# Patient Record
Sex: Female | Born: 1985 | Race: White | Hispanic: Yes | Marital: Married | Smoking: Never smoker
Health system: Southern US, Community
[De-identification: ages and names within clinical notes are randomized; demographics above are authoritative.]

## PROBLEM LIST (undated history)

## (undated) DIAGNOSIS — I10 Essential (primary) hypertension: Secondary | ICD-10-CM

## (undated) DIAGNOSIS — O24419 Gestational diabetes mellitus in pregnancy, unspecified control: Secondary | ICD-10-CM

## (undated) DIAGNOSIS — R7303 Prediabetes: Secondary | ICD-10-CM

## (undated) DIAGNOSIS — Z87442 Personal history of urinary calculi: Secondary | ICD-10-CM

## (undated) DIAGNOSIS — E119 Type 2 diabetes mellitus without complications: Secondary | ICD-10-CM

## (undated) HISTORY — DX: Gestational diabetes mellitus in pregnancy, unspecified control: O24.419

## (undated) HISTORY — DX: Type 2 diabetes mellitus without complications: E11.9

---
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
## 2007-12-05 ENCOUNTER — Emergency Department (HOSPITAL_COMMUNITY): Admission: EM | Admit: 2007-12-05 | Discharge: 2007-12-05 | Payer: Self-pay | Admitting: Emergency Medicine

## 2007-12-07 ENCOUNTER — Emergency Department (HOSPITAL_COMMUNITY): Admission: EM | Admit: 2007-12-07 | Discharge: 2007-12-07 | Payer: Self-pay | Admitting: Emergency Medicine

## 2008-09-21 ENCOUNTER — Inpatient Hospital Stay (HOSPITAL_COMMUNITY): Admission: AD | Admit: 2008-09-21 | Discharge: 2008-09-22 | Payer: Self-pay | Admitting: Obstetrics & Gynecology

## 2008-09-21 ENCOUNTER — Ambulatory Visit: Payer: Self-pay | Admitting: Obstetrics & Gynecology

## 2008-09-28 ENCOUNTER — Inpatient Hospital Stay (HOSPITAL_COMMUNITY): Admission: AD | Admit: 2008-09-28 | Discharge: 2008-09-30 | Payer: Self-pay | Admitting: Family Medicine

## 2008-09-28 ENCOUNTER — Ambulatory Visit: Payer: Self-pay | Admitting: Family Medicine

## 2009-08-19 ENCOUNTER — Emergency Department (HOSPITAL_COMMUNITY): Admission: EM | Admit: 2009-08-19 | Discharge: 2009-08-19 | Payer: Self-pay | Admitting: Emergency Medicine

## 2010-09-01 LAB — CBC
HCT: 30.9 % — ABNORMAL LOW (ref 36.0–46.0)
Hemoglobin: 10.2 g/dL — ABNORMAL LOW (ref 12.0–15.0)
MCHC: 33 g/dL (ref 30.0–36.0)
MCV: 68.8 fL — ABNORMAL LOW (ref 78.0–100.0)
Platelets: 255 10*3/uL (ref 150–400)
RBC: 4.49 MIL/uL (ref 3.87–5.11)
RDW: 16.8 % — ABNORMAL HIGH (ref 11.5–15.5)
WBC: 9.2 10*3/uL (ref 4.0–10.5)

## 2010-09-01 LAB — URINALYSIS, ROUTINE W REFLEX MICROSCOPIC
Bilirubin Urine: NEGATIVE
Glucose, UA: NEGATIVE mg/dL
Ketones, ur: NEGATIVE mg/dL
Nitrite: POSITIVE — AB
Protein, ur: 30 mg/dL — AB
Specific Gravity, Urine: 1.025 (ref 1.005–1.030)
Urobilinogen, UA: 0.2 mg/dL (ref 0.0–1.0)
pH: 6 (ref 5.0–8.0)

## 2010-09-01 LAB — DIFFERENTIAL
Basophils Absolute: 0 10*3/uL (ref 0.0–0.1)
Basophils Relative: 0 % (ref 0–1)
Eosinophils Absolute: 0 10*3/uL (ref 0.0–0.7)
Eosinophils Relative: 0 % (ref 0–5)
Lymphocytes Relative: 8 % — ABNORMAL LOW (ref 12–46)
Lymphs Abs: 0.7 10*3/uL (ref 0.7–4.0)
Monocytes Absolute: 0.4 10*3/uL (ref 0.1–1.0)
Monocytes Relative: 4 % (ref 3–12)
Neutro Abs: 8.1 10*3/uL — ABNORMAL HIGH (ref 1.7–7.7)
Neutrophils Relative %: 88 % — ABNORMAL HIGH (ref 43–77)

## 2010-09-01 LAB — URINE MICROSCOPIC-ADD ON

## 2010-09-01 LAB — URINE CULTURE: Colony Count: >=100000

## 2010-09-01 LAB — PREGNANCY, URINE: Preg Test, Ur: NEGATIVE

## 2010-09-18 LAB — CBC
HCT: 32.9 % — ABNORMAL LOW (ref 36.0–46.0)
HCT: 33 % — ABNORMAL LOW (ref 36.0–46.0)
HCT: 34.2 % — ABNORMAL LOW (ref 36.0–46.0)
Hemoglobin: 11.1 g/dL — ABNORMAL LOW (ref 12.0–15.0)
Hemoglobin: 11.2 g/dL — ABNORMAL LOW (ref 12.0–15.0)
Hemoglobin: 11.6 g/dL — ABNORMAL LOW (ref 12.0–15.0)
MCHC: 33.8 g/dL (ref 30.0–36.0)
MCHC: 33.8 g/dL (ref 30.0–36.0)
MCHC: 33.9 g/dL (ref 30.0–36.0)
MCV: 84.6 fL (ref 78.0–100.0)
MCV: 84.8 fL (ref 78.0–100.0)
MCV: 85 fL (ref 78.0–100.0)
Platelets: 189 10*3/uL (ref 150–400)
Platelets: 189 10*3/uL (ref 150–400)
Platelets: 212 10*3/uL (ref 150–400)
RBC: 3.87 MIL/uL (ref 3.87–5.11)
RBC: 3.9 MIL/uL (ref 3.87–5.11)
RBC: 4.04 MIL/uL (ref 3.87–5.11)
RDW: 13.8 % (ref 11.5–15.5)
RDW: 14 % (ref 11.5–15.5)
RDW: 14.4 % (ref 11.5–15.5)
WBC: 7.6 10*3/uL (ref 4.0–10.5)
WBC: 7.8 10*3/uL (ref 4.0–10.5)
WBC: 8 10*3/uL (ref 4.0–10.5)

## 2010-09-18 LAB — DIFFERENTIAL
Basophils Absolute: 0 10*3/uL (ref 0.0–0.1)
Basophils Relative: 0 % (ref 0–1)
Eosinophils Absolute: 0.1 10*3/uL (ref 0.0–0.7)
Eosinophils Relative: 1 % (ref 0–5)
Lymphocytes Relative: 18 % (ref 12–46)
Lymphs Abs: 1.4 10*3/uL (ref 0.7–4.0)
Monocytes Absolute: 0.3 10*3/uL (ref 0.1–1.0)
Monocytes Relative: 4 % (ref 3–12)
Neutro Abs: 5.9 10*3/uL (ref 1.7–7.7)
Neutrophils Relative %: 77 % (ref 43–77)

## 2010-09-18 LAB — RPR
RPR Ser Ql: NONREACTIVE
RPR Ser Ql: NONREACTIVE

## 2010-09-18 LAB — RUBELLA SCREEN: Rubella: 197.1 IU/mL — ABNORMAL HIGH

## 2010-09-18 LAB — HIV ANTIBODY (ROUTINE TESTING W REFLEX): HIV: NONREACTIVE

## 2010-09-18 LAB — HEPATITIS B SURFACE ANTIGEN: Hepatitis B Surface Ag: NEGATIVE

## 2010-09-18 LAB — GC/CHLAMYDIA PROBE AMP, GENITAL
Chlamydia, DNA Probe: NEGATIVE
GC Probe Amp, Genital: NEGATIVE

## 2010-09-18 LAB — TYPE AND SCREEN
ABO/RH(D): O POS
Antibody Screen: NEGATIVE

## 2010-09-18 LAB — STREP B DNA PROBE: Strep Group B Ag: NEGATIVE

## 2010-09-18 LAB — GLUCOSE, RANDOM: Glucose, Bld: 116 mg/dL — ABNORMAL HIGH (ref 70–99)

## 2010-09-18 LAB — ABO/RH: ABO/RH(D): O POS

## 2011-03-06 LAB — WET PREP, GENITAL
Clue Cells Wet Prep HPF POC: NONE SEEN
Trich, Wet Prep: NONE SEEN
Yeast Wet Prep HPF POC: NONE SEEN

## 2011-03-06 LAB — URINE MICROSCOPIC-ADD ON

## 2011-03-06 LAB — URINALYSIS, ROUTINE W REFLEX MICROSCOPIC
Bilirubin Urine: NEGATIVE
Bilirubin Urine: NEGATIVE
Glucose, UA: NEGATIVE
Ketones, ur: NEGATIVE
Ketones, ur: NEGATIVE
Leukocytes, UA: NEGATIVE
Nitrite: POSITIVE — AB
Protein, ur: 100 — AB
Protein, ur: 100 — AB
Specific Gravity, Urine: >1.03 — ABNORMAL HIGH
Urobilinogen, UA: 0.2
Urobilinogen, UA: 1
pH: 5.5

## 2011-03-06 LAB — CBC
HCT: 35.4 — ABNORMAL LOW
Hemoglobin: 11.5 — ABNORMAL LOW
Hemoglobin: 12.3
RBC: 4.2
RDW: 14.3
WBC: 7.8

## 2011-03-06 LAB — BASIC METABOLIC PANEL
CO2: 24
Calcium: 8.7
Calcium: 8.8
GFR calc Af Amer: >60
GFR calc Af Amer: >60
GFR calc non Af Amer: >60
GFR calc non Af Amer: >60
Glucose, Bld: 89
Potassium: 3.5
Sodium: 138
Sodium: 138

## 2011-03-06 LAB — POCT PREGNANCY, URINE
Operator id: 277751
Preg Test, Ur: POSITIVE

## 2011-03-06 LAB — DIFFERENTIAL
Basophils Absolute: 0
Lymphocytes Relative: 27
Monocytes Absolute: 0.5
Neutro Abs: 5

## 2011-03-06 LAB — GC/CHLAMYDIA PROBE AMP, GENITAL
Chlamydia, DNA Probe: NEGATIVE
GC Probe Amp, Genital: NEGATIVE

## 2011-03-06 LAB — PREGNANCY, URINE: Preg Test, Ur: POSITIVE

## 2011-03-06 LAB — HCG, QUANTITATIVE, PREGNANCY: hCG, Beta Chain, Quant, S: 1722 — ABNORMAL HIGH

## 2013-03-26 ENCOUNTER — Emergency Department (HOSPITAL_COMMUNITY)
Admission: EM | Admit: 2013-03-26 | Discharge: 2013-03-27 | Disposition: A | Discharge status: Home / Self Care | Payer: Self-pay | Attending: Emergency Medicine | Admitting: Emergency Medicine

## 2013-03-26 ENCOUNTER — Encounter (HOSPITAL_COMMUNITY): Payer: Self-pay | Admitting: Emergency Medicine

## 2013-03-26 ENCOUNTER — Emergency Department (HOSPITAL_COMMUNITY): Payer: Self-pay

## 2013-03-26 DIAGNOSIS — I10 Essential (primary) hypertension: Secondary | ICD-10-CM | POA: Insufficient documentation

## 2013-03-26 DIAGNOSIS — IMO0001 Reserved for inherently not codable concepts without codable children: Secondary | ICD-10-CM | POA: Insufficient documentation

## 2013-03-26 DIAGNOSIS — J159 Unspecified bacterial pneumonia: Secondary | ICD-10-CM | POA: Insufficient documentation

## 2013-03-26 DIAGNOSIS — J189 Pneumonia, unspecified organism: Secondary | ICD-10-CM

## 2013-03-26 DIAGNOSIS — R509 Fever, unspecified: Secondary | ICD-10-CM | POA: Insufficient documentation

## 2013-03-26 HISTORY — DX: Essential (primary) hypertension: I10

## 2013-03-26 MED ORDER — ALBUTEROL SULFATE HFA 108 (90 BASE) MCG/ACT IN AERS
2.0000 | INHALATION_SPRAY | Freq: Four times a day (QID) | RESPIRATORY_TRACT | Status: DC
  Administered 2013-03-26: 2 via RESPIRATORY_TRACT
  Filled 2013-03-26: qty 6.7

## 2013-03-26 MED ORDER — LEVOFLOXACIN 500 MG PO TABS: 500.0000 mg | ORAL_TABLET | Freq: Every day | ORAL | Status: AC

## 2013-03-26 MED ORDER — ALBUTEROL SULFATE (5 MG/ML) 0.5% IN NEBU
5.0000 mg | INHALATION_SOLUTION | Freq: Once | RESPIRATORY_TRACT | Status: AC
  Administered 2013-03-26: 5 mg via RESPIRATORY_TRACT
  Filled 2013-03-26: qty 1

## 2013-03-26 MED ORDER — LEVOFLOXACIN 500 MG PO TABS
500.0000 mg | ORAL_TABLET | Freq: Once | ORAL | Status: AC
  Administered 2013-03-26: 500 mg via ORAL
  Filled 2013-03-26: qty 1

## 2013-03-26 NOTE — ED Notes (Addendum)
Pt states cough, fever, chills sweats and generalized body aches for the past 3 days. Pt states that she has tried OTC medications including tylenol, nyquil and ibuprofen with no relief from symptoms. Pt states cough is dry, non-productive.

## 2013-03-26 NOTE — ED Notes (Signed)
Pt taken to xray 

## 2013-03-26 NOTE — ED Provider Notes (Signed)
CSN: [id number]     Arrival date & time 03/26/13  2035 History   First MD Initiated Contact with Patient 03/26/13 2113     Chief Complaint  Patient presents with  . URI   (Consider location/radiation/quality/duration/timing/severity/associated sxs/prior Treatment) HPI Patient presents with concern of ongoing cough, myalgia. Cough began approximately 2 weeks ago, but has become worse over the past 3 days, as her body aches have become more pronounced. The aches are most prominent in the chest and back. There are subjective fever and chills, but no objective change in temperature. There is no concurrent nausea, vomiting, diarrhea, rash, edema. Patient does not smoke, does not drink. No relief with OTC medication.  Past Medical History  Diagnosis Date  . Hypertension    History reviewed. No pertinent past surgical history. History reviewed. No pertinent family history. History  Substance Use Topics  . Smoking status: Never Smoker   . Smokeless tobacco: Not on file  . Alcohol Use: No   OB History   Grav Para Term Preterm Abortions TAB SAB Ect Mult Living                 Review of Systems  Constitutional:       Per HPI, otherwise negative  HENT:       Per HPI, otherwise negative  Respiratory:       Per HPI, otherwise negative  Cardiovascular:       Per HPI, otherwise negative  Gastrointestinal: Negative for vomiting.  Endocrine:       Negative aside from HPI  Genitourinary:       Neg aside from HPI   Musculoskeletal:       Per HPI, otherwise negative  Skin: Negative.   Neurological: Negative for syncope.    Allergies  Review of patient's allergies indicates no known allergies.  Home Medications   Current Outpatient Rx  Name  Route  Sig  Dispense  Refill  . Pseudoeph-Doxylamine-DM-APAP (NYQUIL PO)   Oral   Take 10 mLs by mouth once as needed (for feeling sick).          BP 154/92  Pulse 103  Temp(Src) 99.7 F (37.6 C) (Oral)  Resp 19  SpO2  98% Physical Exam  Nursing note and vitals reviewed. Constitutional: She is oriented to person, place, and time. She appears well-developed and well-nourished. No distress.  HENT:  Head: Normocephalic and atraumatic.  Eyes: Conjunctivae and EOM are normal.  Cardiovascular: Normal rate and regular rhythm.   Pulmonary/Chest: Effort normal. No stridor. No respiratory distress. She has decreased breath sounds.  Abdominal: She exhibits no distension.  Musculoskeletal: She exhibits no edema.  Neurological: She is alert and oriented to person, place, and time. No cranial nerve deficit.  Skin: Skin is warm and dry.  Psychiatric: She has a normal mood and affect.    ED Course  Procedures (including critical care time) Labs Review Labs Reviewed - No data to display Imaging Review No results found.  EKG Interpretation   None      pulse oximetry 100% room air normal X-ray demonstrate pneumonia. MDM  No diagnosis found. This previously well young female presents with no chills, dyspnea, cough.  On exam she is afebrile.  However, given prescription of ongoing symptoms, there was initial suspicion for pneumonia.  This is demonstrated on x-ray.  Absent distress, significant comorbidities, she is appropriate for outpatient management.  She was started on antibiotics here, discharged in stable condition    Gerhard Munch, MD  03/26/13 2306 

## 2013-03-26 NOTE — ED Notes (Signed)
Pt back from from xray

## 2013-09-12 ENCOUNTER — Emergency Department (HOSPITAL_COMMUNITY)
Admission: EM | Admit: 2013-09-12 | Discharge: 2013-09-13 | Disposition: A | Discharge status: Home / Self Care | Payer: Self-pay | Attending: Emergency Medicine | Admitting: Emergency Medicine

## 2013-09-12 ENCOUNTER — Encounter (HOSPITAL_COMMUNITY): Payer: Self-pay | Admitting: Emergency Medicine

## 2013-09-12 DIAGNOSIS — N200 Calculus of kidney: Secondary | ICD-10-CM

## 2013-09-12 DIAGNOSIS — M549 Dorsalgia, unspecified: Secondary | ICD-10-CM

## 2013-09-12 DIAGNOSIS — N390 Urinary tract infection, site not specified: Secondary | ICD-10-CM

## 2013-09-12 DIAGNOSIS — Z3202 Encounter for pregnancy test, result negative: Secondary | ICD-10-CM | POA: Insufficient documentation

## 2013-09-12 LAB — URINALYSIS, ROUTINE W REFLEX MICROSCOPIC
BILIRUBIN URINE: NEGATIVE
Glucose, UA: NEGATIVE mg/dL
KETONES UR: NEGATIVE mg/dL
NITRITE: NEGATIVE
PH: 6.5 (ref 5.0–8.0)
PROTEIN: 100 mg/dL — AB
Specific Gravity, Urine: 1.026 (ref 1.005–1.030)
UROBILINOGEN UA: 1 mg/dL (ref 0.0–1.0)

## 2013-09-12 LAB — URINE MICROSCOPIC-ADD ON

## 2013-09-12 LAB — CBC
HEMATOCRIT: 39.6 % (ref 36.0–46.0)
Hemoglobin: 13.7 g/dL (ref 12.0–15.0)
MCH: 31.3 pg (ref 26.0–34.0)
MCHC: 34.6 g/dL (ref 30.0–36.0)
MCV: 90.4 fL (ref 78.0–100.0)
PLATELETS: 240 10*3/uL (ref 150–400)
RBC: 4.38 MIL/uL (ref 3.87–5.11)
RDW: 12.4 % (ref 11.5–15.5)
WBC: 7.9 10*3/uL (ref 4.0–10.5)

## 2013-09-12 LAB — BASIC METABOLIC PANEL
BUN: 12 mg/dL (ref 6–23)
CALCIUM: 8.9 mg/dL (ref 8.4–10.5)
CHLORIDE: 104 meq/L (ref 96–112)
CO2: 24 mEq/L (ref 19–32)
CREATININE: 0.63 mg/dL (ref 0.50–1.10)
Glucose, Bld: 87 mg/dL (ref 70–99)
Potassium: 4.2 mEq/L (ref 3.7–5.3)
Sodium: 142 mEq/L (ref 137–147)

## 2013-09-12 LAB — POC URINE PREG, ED: Preg Test, Ur: NEGATIVE

## 2013-09-12 NOTE — ED Notes (Addendum)
Pt reports ongoing L flank pain. Pt reports being seen by PCP 1 week ago and dx with kidney infx. Pt has completed antibiotic regimen but is still experiencing discomfort in back and with urination on and off. Pt sts a doctor in the past told her that she may have kidney stones but have never done anything about it.

## 2013-09-13 ENCOUNTER — Emergency Department (HOSPITAL_COMMUNITY): Payer: Self-pay

## 2013-09-13 MED ORDER — CEPHALEXIN 250 MG PO CAPS
500.0000 mg | ORAL_CAPSULE | Freq: Once | ORAL | Status: AC
  Administered 2013-09-13: 500 mg via ORAL
  Filled 2013-09-13: qty 2

## 2013-09-13 MED ORDER — ONDANSETRON HCL 4 MG PO TABS
4.0000 mg | ORAL_TABLET | Freq: Once | ORAL | Status: AC
  Administered 2013-09-13: 4 mg via ORAL
  Filled 2013-09-13: qty 1

## 2013-09-13 MED ORDER — CEPHALEXIN 500 MG PO CAPS: 500.0000 mg | ORAL_CAPSULE | Freq: Three times a day (TID) | ORAL | Status: AC

## 2013-09-13 MED ORDER — OXYCODONE-ACETAMINOPHEN 5-325 MG PO TABS
2.0000 | ORAL_TABLET | Freq: Once | ORAL | Status: AC
  Administered 2013-09-13: 2 via ORAL
  Filled 2013-09-13: qty 2

## 2013-09-13 MED ORDER — TRAMADOL HCL 50 MG PO TABS: 50.0000 mg | ORAL_TABLET | Freq: Four times a day (QID) | ORAL | Status: DC | PRN

## 2013-09-13 NOTE — Discharge Instructions (Signed)
PLEASE RETURN TO THE EMERGENCY DEPARTMENT IF YOU HAVE PERSISTENT FEVER OR WORSENING SYMPTOMS.

## 2013-09-13 NOTE — ED Provider Notes (Signed)
CSN: [id number]     Arrival date & time 09/12/13  1654 History   First MD Initiated Contact with Patient 09/13/13 0017     Chief Complaint  Patient presents with  . Back Pain     (Consider location/radiation/quality/duration/timing/severity/associated sxs/prior Treatment) HPI This patient is a 28 year old woman who notes a history of recent urinary tract infection which is treated with an antibiotic, the name of she cannot recall. Finished course of abx approx 1-2 weeks ago.  She is here today with complaints of lingering left flank pain. She is concerned that she may have a kidney stone. She said her doctor told her once that she might have a kidney stone. This was in 2012. She has never had a diagnostic study.  The patient says her pain is 9/10. She has been nauseated but has not had vomiting. The pain is throbbing and aching and nonradiating. No fever. Urinary frequency and dysuria are endorsed. Nothing improves or worsens her pain.     History reviewed. No pertinent past medical history. History reviewed. No pertinent past surgical history. No family history on file. History  Substance Use Topics  . Smoking status: Never Smoker   . Smokeless tobacco: Not on file  . Alcohol Use: No   OB History   Grav Para Term Preterm Abortions TAB SAB Ect Mult Living                 Review of Systems  Ten point review of symptoms performed and is negative with the exception of symptoms noted above.   Allergies  Review of patient's allergies indicates no known allergies.  Home Medications  No current outpatient prescriptions on file. BP 149/91  Pulse 81  Temp(Src) 98.3 F (36.8 C) (Oral)  Resp 16  Wt 207 lb 11.2 oz (94.212 kg)  SpO2 100%  LMP 08/21/2013 Physical Exam Gen: well developed and well nourished appearing Head: NCAT Eyes: PERL, EOMI Nose: no epistaixis or rhinorrhea Mouth/throat: mucosa is moist and pink Neck: supple, no stridor Lungs: CTA B, no wheezing, rhonchi  or rales CV: regular rate and rythm, good distal pulses.  Abd: soft,  Obese, notender, nondistended Back: no ttp, left cva ttp Skin: warm and dry Ext: no edema, normal to inspection Neuro: CN ii-xii grossly intact, no focal deficits Psyche; normal affect,  calm and cooperative.  ED Course  Procedures (including critical care time) Labs Review  Results for orders placed during the hospital encounter of 09/12/13 (from the past 24 hour(s))  URINALYSIS, ROUTINE W REFLEX MICROSCOPIC     Status: Abnormal   Collection Time    09/12/13  5:16 PM      Result Value Ref Range   Color, Urine YELLOW  YELLOW   APPearance CLOUDY (*) CLEAR   Specific Gravity, Urine 1.026  1.005 - 1.030   pH 6.5  5.0 - 8.0   Glucose, UA NEGATIVE  NEGATIVE mg/dL   Hgb urine dipstick SMALL (*) NEGATIVE   Bilirubin Urine NEGATIVE  NEGATIVE   Ketones, ur NEGATIVE  NEGATIVE mg/dL   Protein, ur 409100 (*) NEGATIVE mg/dL   Urobilinogen, UA 1.0  0.0 - 1.0 mg/dL   Nitrite NEGATIVE  NEGATIVE   Leukocytes, UA MODERATE (*) NEGATIVE  URINE MICROSCOPIC-ADD ON     Status: Abnormal   Collection Time    09/12/13  5:16 PM      Result Value Ref Range   Squamous Epithelial / LPF FEW (*) RARE   WBC, UA 7-10  <3  WBC/hpf   RBC / HPF 3-6  <3 RBC/hpf   Bacteria, UA FEW (*) RARE   Urine-Other MUCOUS PRESENT    CBC     Status: None   Collection Time    09/12/13  5:23 PM      Result Value Ref Range   WBC 7.9  4.0 - 10.5 K/uL   RBC 4.38  3.87 - 5.11 MIL/uL   Hemoglobin 13.7  12.0 - 15.0 g/dL   HCT 16.1  09.6 - 04.5 %   MCV 90.4  78.0 - 100.0 fL   MCH 31.3  26.0 - 34.0 pg   MCHC 34.6  30.0 - 36.0 g/dL   RDW 40.9  81.1 - 91.4 %   Platelets 240  150 - 400 K/uL  BASIC METABOLIC PANEL     Status: None   Collection Time    09/12/13  5:23 PM      Result Value Ref Range   Sodium 142  137 - 147 mEq/L   Potassium 4.2  3.7 - 5.3 mEq/L   Chloride 104  96 - 112 mEq/L   CO2 24  19 - 32 mEq/L   Glucose, Bld 87  70 - 99 mg/dL   BUN 12  6  - 23 mg/dL   Creatinine, Ser 7.82  0.50 - 1.10 mg/dL   Calcium 8.9  8.4 - 95.6 mg/dL   GFR calc non Af Amer >90  >90 mL/min   GFR calc Af Amer >90  >90 mL/min  POC URINE PREG, ED     Status: None   Collection Time    09/12/13  5:25 PM      Result Value Ref Range   Preg Test, Ur NEGATIVE  NEGATIVE   US Renal (Final result)  Result time: 09/13/13 02:58:15    Final result by Rad Results In Interface (09/13/13 02:58:15)    Narrative:   CLINICAL DATA: Back pain.  EXAM: RENAL/URINARY TRACT ULTRASOUND COMPLETE  COMPARISON: None.  FINDINGS: Right Kidney:  Length: 12.5 cm. Echogenicity within normal limits. No mass or hydronephrosis visualized.  Left Kidney:  Length: 14.1 cm. Echogenicity within normal limits. No evidence of hydronephrosis. Multiple left renal stones are seen, measuring up to 1.5 cm in size. No masses identified.  Bladder:  Appears normal for degree of bladder distention. Bilateral ureteral jets are visualized.  IMPRESSION: 1. No evidence of hydronephrosis. Bilateral ureteral jets seen. 2. Multiple nonobstructing left renal stones seen, measuring up to 1.5 cm in size.   Electronically Signed By: Roanna Raider M.D. On: 09/13/2013 02:58       MDM   Patient with UTI confirmed by urinalysis. We will obtain US of the left kidney to evaluate for signs of ureterolithiasis or pyelonephritis. ED tx to include analgesia, antiemetic and first dose of anitbiotic. Anticipate discharge home.   2130: renal u/s shows that the patient has multiple non obstructing left sided renal stones but no signs of hydronephrosis or obstruction.  I have informed the patient of the U/S findings and need for Urology follow up. Patient referred to Dr. Margarita Grizzle for outpatient follow up re: 1.5cm stone in left kidney. She is counseled re: return precautions.     Brandt Loosen, MD 09/13/13 [phone]

## 2013-10-12 DIAGNOSIS — N390 Urinary tract infection, site not specified: Secondary | ICD-10-CM

## 2013-10-12 DIAGNOSIS — N200 Calculus of kidney: Secondary | ICD-10-CM | POA: Insufficient documentation

## 2013-10-12 HISTORY — DX: Calculus of kidney: N20.0

## 2013-10-12 HISTORY — DX: Urinary tract infection, site not specified: N39.0

## 2013-10-17 HISTORY — PX: LITHOTRIPSY: SUR834

## 2013-10-22 ENCOUNTER — Emergency Department (HOSPITAL_COMMUNITY)
Admission: EM | Admit: 2013-10-22 | Discharge: 2013-10-23 | Disposition: A | Discharge status: Home / Self Care | Payer: No Typology Code available for payment source | Attending: Emergency Medicine | Admitting: Emergency Medicine

## 2013-10-22 ENCOUNTER — Emergency Department (HOSPITAL_COMMUNITY): Payer: No Typology Code available for payment source

## 2013-10-22 ENCOUNTER — Encounter (HOSPITAL_COMMUNITY): Payer: Self-pay | Admitting: Emergency Medicine

## 2013-10-22 DIAGNOSIS — R110 Nausea: Secondary | ICD-10-CM | POA: Insufficient documentation

## 2013-10-22 DIAGNOSIS — Z9889 Other specified postprocedural states: Secondary | ICD-10-CM | POA: Insufficient documentation

## 2013-10-22 DIAGNOSIS — E669 Obesity, unspecified: Secondary | ICD-10-CM | POA: Insufficient documentation

## 2013-10-22 DIAGNOSIS — R109 Unspecified abdominal pain: Secondary | ICD-10-CM | POA: Insufficient documentation

## 2013-10-22 DIAGNOSIS — Z792 Long term (current) use of antibiotics: Secondary | ICD-10-CM | POA: Insufficient documentation

## 2013-10-22 DIAGNOSIS — Z3202 Encounter for pregnancy test, result negative: Secondary | ICD-10-CM | POA: Insufficient documentation

## 2013-10-22 LAB — CBC WITH DIFFERENTIAL/PLATELET
BASOS PCT: 0 % (ref 0–1)
Basophils Absolute: 0 10*3/uL (ref 0.0–0.1)
EOS ABS: 0.2 10*3/uL (ref 0.0–0.7)
EOS PCT: 2 % (ref 0–5)
HEMATOCRIT: 34 % — AB (ref 36.0–46.0)
Hemoglobin: 11.8 g/dL — ABNORMAL LOW (ref 12.0–15.0)
Lymphocytes Relative: 26 % (ref 12–46)
Lymphs Abs: 2.4 10*3/uL (ref 0.7–4.0)
MCH: 31 pg (ref 26.0–34.0)
MCHC: 34.7 g/dL (ref 30.0–36.0)
MCV: 89.2 fL (ref 78.0–100.0)
MONO ABS: 0.7 10*3/uL (ref 0.1–1.0)
Monocytes Relative: 8 % (ref 3–12)
Neutro Abs: 6.1 10*3/uL (ref 1.7–7.7)
Neutrophils Relative %: 64 % (ref 43–77)
Platelets: 305 10*3/uL (ref 150–400)
RBC: 3.81 MIL/uL — ABNORMAL LOW (ref 3.87–5.11)
RDW: 12.4 % (ref 11.5–15.5)
WBC: 9.4 10*3/uL (ref 4.0–10.5)

## 2013-10-22 LAB — COMPREHENSIVE METABOLIC PANEL
ALBUMIN: 3.3 g/dL — AB (ref 3.5–5.2)
ALT: 14 U/L (ref 0–35)
AST: 14 U/L (ref 0–37)
Alkaline Phosphatase: 62 U/L (ref 39–117)
BUN: 11 mg/dL (ref 6–23)
CALCIUM: 9.4 mg/dL (ref 8.4–10.5)
CO2: 23 mEq/L (ref 19–32)
Chloride: 99 mEq/L (ref 96–112)
Creatinine, Ser: 0.5 mg/dL (ref 0.50–1.10)
GFR calc Af Amer: >90 mL/min (ref >90)
GFR calc non Af Amer: >90 mL/min (ref >90)
Glucose, Bld: 100 mg/dL — ABNORMAL HIGH (ref 70–99)
Potassium: 3.6 mEq/L — ABNORMAL LOW (ref 3.7–5.3)
Sodium: 138 mEq/L (ref 137–147)
Total Bilirubin: 0.3 mg/dL (ref 0.3–1.2)
Total Protein: 7.8 g/dL (ref 6.0–8.3)

## 2013-10-22 LAB — LIPASE, BLOOD: LIPASE: 19 U/L (ref 11–59)

## 2013-10-22 LAB — PREGNANCY, URINE: PREG TEST UR: NEGATIVE

## 2013-10-22 MED ORDER — SODIUM CHLORIDE 0.9 % IV SOLN
1000.0000 mL | Freq: Once | INTRAVENOUS | Status: AC
  Administered 2013-10-22: 1000 mL via INTRAVENOUS

## 2013-10-22 MED ORDER — HYDROMORPHONE HCL PF 1 MG/ML IJ SOLN
1.0000 mg | Freq: Once | INTRAMUSCULAR | Status: AC
  Administered 2013-10-22: 1 mg via INTRAVENOUS
  Filled 2013-10-22: qty 1

## 2013-10-22 MED ORDER — KETOROLAC TROMETHAMINE 30 MG/ML IJ SOLN
30.0000 mg | Freq: Once | INTRAMUSCULAR | Status: AC
  Administered 2013-10-23: 30 mg via INTRAVENOUS
  Filled 2013-10-22: qty 1

## 2013-10-22 MED ORDER — ONDANSETRON HCL 4 MG/2ML IJ SOLN
4.0000 mg | Freq: Once | INTRAMUSCULAR | Status: AC
  Administered 2013-10-22: 4 mg via INTRAVENOUS
  Filled 2013-10-22: qty 2

## 2013-10-22 NOTE — ED Provider Notes (Signed)
CSN: [id number]     Arrival date & time 10/22/13  2131 History   First MD Initiated Contact with Patient 10/22/13 2158     Chief Complaint  Patient presents with  . Flank Pain     (Consider location/radiation/quality/duration/timing/severity/associated sxs/prior Treatment) HPI Patient presents with left flank pain.  Pain began earlier today, radiates towards the left inguinal crease.  Pain is severe, sharp, unrelenting. No relief with Percocet. No concurrent fever, chills, vomiting, though the patient has nausea. Patient had a lithotripsy 5 days ago at another facility. She states that she generally recovering well until today.  History reviewed. No pertinent past medical history. Past Surgical History  Procedure Laterality Date  . Lithotripsy  10/17/2013   No family history on file. History  Substance Use Topics  . Smoking status: Never Smoker   . Smokeless tobacco: Not on file  . Alcohol Use: No   OB History   Grav Para Term Preterm Abortions TAB SAB Ect Mult Living                 Review of Systems  Constitutional:       Per HPI, otherwise negative  HENT:       Per HPI, otherwise negative  Respiratory:       Per HPI, otherwise negative  Cardiovascular:       Per HPI, otherwise negative  Gastrointestinal: Positive for nausea. Negative for vomiting and diarrhea.  Endocrine:       Negative aside from HPI  Genitourinary:       Neg aside from HPI   Musculoskeletal:       Per HPI, otherwise negative  Skin: Negative.   Neurological: Negative for syncope.      Allergies  Review of patient's allergies indicates no known allergies.  Home Medications   Prior to Admission medications   Medication Sig Start Date End Date Taking? Authorizing Provider  ciprofloxacin (CIPRO) 500 MG tablet Take 500 mg by mouth 2 (two) times daily. 5 day therapy course patient began on 10/18/2013 10/18/13 10/23/13 Yes Historical Provider, MD  oxyCODONE-acetaminophen (PERCOCET/ROXICET)  5-325 MG per tablet Take 1-2 tablets by mouth every 4 (four) hours as needed for severe pain (for pain).   Yes Historical Provider, MD   BP 135/98  Pulse 81  Temp(Src) 98.7 F (37.1 C) (Oral)  Resp 19  Ht 5\' 5"  (1.651 m)  Wt 207 lb (93.895 kg)  BMI 34.45 kg/m2  SpO2 100%  LMP 09/24/2013 Physical Exam  Nursing note and vitals reviewed. Constitutional: She is oriented to person, place, and time. She appears well-developed and well-nourished. No distress.  Very uncomfortable appearing obese young female.   HENT:  Head: Normocephalic and atraumatic.  Eyes: Conjunctivae and EOM are normal.  Cardiovascular: Normal rate and regular rhythm.   Pulmonary/Chest: Effort normal and breath sounds normal. No stridor. No respiratory distress.  Abdominal: Soft. Normal appearance. She exhibits no distension. There is no tenderness.  Musculoskeletal: She exhibits no edema.       Arms: Neurological: She is alert and oriented to person, place, and time. No cranial nerve deficit.  Skin: Skin is warm and dry.  Psychiatric: She has a normal mood and affect.    ED Course  Procedures (including critical care time) Labs Review Labs Reviewed  CBC WITH DIFFERENTIAL - Abnormal; Notable for the following:    RBC 3.81 (*)    Hemoglobin 11.8 (*)    HCT 34.0 (*)    All other components within  normal limits  COMPREHENSIVE METABOLIC PANEL - Abnormal; Notable for the following:    Potassium 3.6 (*)    Glucose, Bld 100 (*)    Albumin 3.3 (*)    All other components within normal limits  LIPASE, BLOOD  URINALYSIS, ROUTINE W REFLEX MICROSCOPIC  PREGNANCY, URINE    Imaging Review [place] Renal  10/22/2013   CLINICAL DATA:  Left flank pain 5 days post left the lithotripsy  EXAM: RENAL/URINARY TRACT ULTRASOUND COMPLETE  COMPARISON:  CT UROGRAM dated 09/14/2013  FINDINGS: Right Kidney:  Length: 12.6 cm. The echotexture of the right kidney is normal. There is no hydronephrosis.  Left Kidney:  Length: 13.1 cm.  There  is mild hydronephrosis.  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  Bladder:  The urinary bladder is partially distended and grossly normal.  IMPRESSION: 1. There is mild left-sided hydronephrosis. Stone fragments are suspected within the renal collecting system. If further imaging is desired, noncontrast CT scanning would likely be the most useful. 2. The right kidney is normal in appearance.   Electronically Signed   By: David  Swazil[place]   On: 10/22/2013 23:42    I attempted to obtain records from the other facility.  11:50 PM Patient resting comfortably following dilaudid, zofran, NS  12:26 AM Patient nearly asleep. MDM  Patient presents several days after lithotripsy with pain.  Patient is afebrile, awake, alert.  Patient's labs are reassuring, and ultrasound does not demonstrate acute obstruction. With substantial improvement in pain control, patient was discharged in stable condition to follow up with her urology team.   Gerhard Munchobert Pavle Wiler, MD 10/23/13 [phone]

## 2013-10-22 NOTE — ED Notes (Signed)
Bed: WA06 Expected date:  Expected time:  Means of arrival:  Comments: EMS 44F flank pain s/p lithotripsy

## 2013-10-22 NOTE — ED Notes (Signed)
Per EMS- [facility]. Pt thinks they got the bigger ones but not the smaller ones. Takes cipro and roxicet prescriptions from recent evaluation. No relief from Roxicet. C/o 10/10 lower back pain radiating to the groin area on left side. Feels like "pressure." Bandaging from lithotripsy still intact. 20 PIV in left FA. Nausea started upon arrival to hospital.

## 2013-10-23 LAB — URINALYSIS, ROUTINE W REFLEX MICROSCOPIC
Bilirubin Urine: NEGATIVE
GLUCOSE, UA: NEGATIVE mg/dL
Ketones, ur: NEGATIVE mg/dL
Nitrite: NEGATIVE
Protein, ur: 100 mg/dL — AB
SPECIFIC GRAVITY, URINE: 1.016 (ref 1.005–1.030)
UROBILINOGEN UA: 1 mg/dL (ref 0.0–1.0)
pH: 7.5 (ref 5.0–8.0)

## 2013-10-23 LAB — URINE MICROSCOPIC-ADD ON

## 2013-10-23 MED ORDER — HYDROCODONE-ACETAMINOPHEN 10-325 MG PO TABS: 1.0000 | ORAL_TABLET | Freq: Four times a day (QID) | ORAL | Status: DC | PRN

## 2013-10-23 MED ORDER — CIPROFLOXACIN HCL 500 MG PO TABS: 500.0000 mg | ORAL_TABLET | Freq: Two times a day (BID) | ORAL | Status: AC

## 2013-10-23 NOTE — Discharge Instructions (Signed)
As discussed, it is important that you follow up as soon as possible with your physician for continued management of your condition. ° °If you develop any new, or concerning changes in your condition, please return to the emergency department immediately. ° °

## 2014-04-25 ENCOUNTER — Other Ambulatory Visit (HOSPITAL_COMMUNITY): Payer: Self-pay | Admitting: *Deleted

## 2014-04-25 DIAGNOSIS — N6320 Unspecified lump in the left breast, unspecified quadrant: Secondary | ICD-10-CM

## 2014-04-25 DIAGNOSIS — N6310 Unspecified lump in the right breast, unspecified quadrant: Secondary | ICD-10-CM

## 2014-05-10 ENCOUNTER — Encounter (HOSPITAL_COMMUNITY): Payer: Self-pay | Admitting: *Deleted

## 2014-05-11 ENCOUNTER — Ambulatory Visit: Payer: No Typology Code available for payment source

## 2014-05-11 ENCOUNTER — Ambulatory Visit (HOSPITAL_COMMUNITY)
Admission: RE | Admit: 2014-05-11 | Discharge: 2014-05-11 | Disposition: A | Discharge status: Home / Self Care | Payer: No Typology Code available for payment source | Source: Ambulatory Visit | Attending: Obstetrics and Gynecology | Admitting: Obstetrics and Gynecology

## 2014-09-20 ENCOUNTER — Emergency Department (HOSPITAL_COMMUNITY)
Admission: EM | Admit: 2014-09-20 | Discharge: 2014-09-20 | Disposition: A | Discharge status: Home / Self Care | Payer: No Typology Code available for payment source | Attending: Emergency Medicine | Admitting: Emergency Medicine

## 2014-09-20 ENCOUNTER — Encounter (HOSPITAL_COMMUNITY): Payer: Self-pay | Admitting: *Deleted

## 2014-09-20 ENCOUNTER — Emergency Department (HOSPITAL_COMMUNITY): Payer: No Typology Code available for payment source

## 2014-09-20 DIAGNOSIS — J40 Bronchitis, not specified as acute or chronic: Secondary | ICD-10-CM | POA: Insufficient documentation

## 2014-09-20 MED ORDER — ALBUTEROL SULFATE HFA 108 (90 BASE) MCG/ACT IN AERS
2.0000 | INHALATION_SPRAY | RESPIRATORY_TRACT | Status: DC | PRN
  Administered 2014-09-20: 2 via RESPIRATORY_TRACT
  Filled 2014-09-20: qty 6.7

## 2014-09-20 MED ORDER — PREDNISONE 20 MG PO TABS: 40.0000 mg | ORAL_TABLET | Freq: Every day | ORAL | Status: DC

## 2014-09-20 MED ORDER — BENZONATATE 100 MG PO CAPS: 200.0000 mg | ORAL_CAPSULE | Freq: Two times a day (BID) | ORAL | Status: DC | PRN

## 2014-09-20 NOTE — ED Notes (Signed)
Pt reports productive cough x 4 weeks and cold symptoms. Denies fever, n/v/d.

## 2014-09-20 NOTE — ED Provider Notes (Signed)
CSN: [id number]     Arrival date & time 09/20/14  1002 History  This chart was scribed for non-physician practitioner, Roxy Horseman, PA-C, working with Richardean Canal, MD by Charline Bills, ED Scribe. This patient was seen in room TR07C/TR07C and the patient's care was started at 11:26 AM.   Chief Complaint  Patient presents with  . Cough   The history is provided by the patient. No language interpreter was used.   HPI Comments: Monica Wolfe is a 29 y.o. female, with no pertinent medical history, who presents to the Emergency Department with a chief complaint of persistent productive cough for the past month. She reports associated fever 3 weeks ago that has resolved. Denies any sore throat, chest, shortness of breath. No h/o asthma or DM. There are no aggravating or alleviating factors.  History reviewed. No pertinent past medical history. Past Surgical History  Procedure Laterality Date  . Lithotripsy  10/17/2013   History reviewed. No pertinent family history. History  Substance Use Topics  . Smoking status: Never Smoker   . Smokeless tobacco: Not on file  . Alcohol Use: No   OB History    No data available     Review of Systems  Constitutional: Negative for fever and chills.  Respiratory: Positive for cough. Negative for shortness of breath.   Cardiovascular: Negative for chest pain.  Gastrointestinal: Negative for nausea, vomiting, diarrhea and constipation.  Genitourinary: Negative for dysuria.   Allergies  Review of patient's allergies indicates no known allergies.  Home Medications   Prior to Admission medications   Medication Sig Start Date End Date Taking? Authorizing Provider  HYDROcodone-acetaminophen (NORCO) 10-325 MG per tablet Take 1 tablet by mouth every 6 (six) hours as needed. 10/23/13   Gerhard Munch, MD   BP 138/97 mmHg  Pulse 66  Temp(Src) 98 F (36.7 C) (Oral)  Resp 18  SpO2 96%  LMP 07/21/2014 Physical Exam  Constitutional: She is  oriented to person, place, and time. She appears well-developed and well-nourished. No distress.  HENT:  Head: Normocephalic and atraumatic.  Mouth/Throat: Oropharynx is clear and moist. No oropharyngeal exudate or tonsillar abscesses.  Eyes: Conjunctivae and EOM are normal.  Neck: Neck supple. No tracheal deviation present.  Cardiovascular: Normal rate.   Pulmonary/Chest: Effort normal. No respiratory distress. She has no wheezes. She has no rales.  Lungs are clear to auscultation bilaterally.  Musculoskeletal: Normal range of motion.  Neurological: She is alert and oriented to person, place, and time.  Skin: Skin is warm and dry.  Psychiatric: She has a normal mood and affect. Her behavior is normal.  Nursing note and vitals reviewed.  ED Course  Procedures (including critical care time) DIAGNOSTIC STUDIES: Oxygen Saturation is 96% on RA, normal by my interpretation.    COORDINATION OF CARE: 11:29 AM-Discussed treatment plan which includes CXR, Prednisone and inhaler with pt at bedside and pt agreed to plan.   Labs Review Labs Reviewed - No data to display  Imaging Review Dg Chest 2 View  09/20/2014   CLINICAL DATA:  29 year old female with cough and sternal chest pain for 1 month. Initial encounter.  EXAM: CHEST  2 VIEW  COMPARISON:  CT Abdomen and Pelvis 09/14/2013, chest radiographs 03/26/2013.  FINDINGS: The 2014 patchy opacity in the right upper lung has cleared. Stable lung volumes. Normal cardiac size and mediastinal contours. Visualized tracheal air column is within normal limits. No pneumothorax, pulmonary edema, pleural effusion or confluent pulmonary opacity. No acute osseous abnormality  identified.  IMPRESSION: No acute cardiopulmonary abnormality.   Electronically Signed   By: Odessa FlemingH  Hall M.D.   On: 09/20/2014 10:58    EKG Interpretation None      MDM   Final diagnoses:  Bronchitis    Patient with cough and cold-like symptoms for the past 4 weeks. Plain films are  negative. Patient is afebrile. Vital signs are stable. Patient is well-appearing. [facility], and an inhaler and prednisone. DC to home.  I personally performed the services described in this documentation, which was scribed in my presence. The recorded information has been reviewed and is accurate.     Roxy HorsemanRobert Tekoa Hamor, PA-C 09/20/14 1144  Richardean Canalavid H Yao, MD 09/20/14 [phone]

## 2014-09-20 NOTE — Discharge Instructions (Signed)
Acute Bronchitis Bronchitis is inflammation of the airways that extend from the windpipe into the lungs (bronchi). The inflammation often causes mucus to develop. This leads to a cough, which is the most common symptom of bronchitis.  In acute bronchitis, the condition usually develops suddenly and goes away over time, usually in a couple weeks. Smoking, allergies, and asthma can make bronchitis worse. Repeated episodes of bronchitis may cause further lung problems.  CAUSES Acute bronchitis is most often caused by the same virus that causes a cold. The virus can spread from person to person (contagious) through coughing, sneezing, and touching contaminated objects. SIGNS AND SYMPTOMS   Cough.   Fever.   Coughing up mucus.   Body aches.   Chest congestion.   Chills.   Shortness of breath.   Sore throat.  DIAGNOSIS  Acute bronchitis is usually diagnosed through a physical exam. Your health care provider will also ask you questions about your medical history. Tests, such as chest X-rays, are sometimes done to rule out other conditions.  TREATMENT  Acute bronchitis usually goes away in a couple weeks. Oftentimes, no medical treatment is necessary. Medicines are sometimes given for relief of fever or cough. Antibiotic medicines are usually not needed but may be prescribed in certain situations. In some cases, an inhaler may be recommended to help reduce shortness of breath and control the cough. A cool mist vaporizer may also be used to help thin bronchial secretions and make it easier to clear the chest.  HOME CARE INSTRUCTIONS  Get plenty of rest.   Drink enough fluids to keep your urine clear or pale yellow (unless you have a medical condition that requires fluid restriction). Increasing fluids may help thin your respiratory secretions (sputum) and reduce chest congestion, and it will prevent dehydration.   Take medicines only as directed by your health care provider.  If  you were prescribed an antibiotic medicine, finish it all even if you start to feel better.  Avoid smoking and secondhand smoke. Exposure to cigarette smoke or irritating chemicals will make bronchitis worse. If you are a smoker, consider using nicotine gum or skin patches to help control withdrawal symptoms. Quitting smoking will help your lungs heal faster.   Reduce the chances of another bout of acute bronchitis by washing your hands frequently, avoiding people with cold symptoms, and trying not to touch your hands to your mouth, nose, or eyes.   Keep all follow-up visits as directed by your health care provider.  SEEK MEDICAL CARE IF: Your symptoms do not improve after 1 week of treatment.  SEEK IMMEDIATE MEDICAL CARE IF:  You develop an increased fever or chills.   You have chest pain.   You have severe shortness of breath.  You have bloody sputum.   You develop dehydration.  You faint or repeatedly feel like you are going to pass out.  You develop repeated vomiting.  You develop a severe headache. MAKE SURE YOU:   Understand these instructions.  Will watch your condition.  Will get help right away if you are not doing well or get worse. Document Released: 07/03/2004 Document Revised: 10/10/2013 Document Reviewed: 11/16/2012 ExitCare Patient Information 2015 [employer]. This information is not intended to replace advice given to you by your health care provider. Make sure you discuss any questions you have with your health care provider.  Bronquitis aguda (Acute Bronchitis) La bronquitis es una inflamacin de las vas respiratorias que se extienden desde la trquea hasta los pulmones (bronquios).   La inflamacin produce la formacin de mucosidad. Esto produce tos, que es el sntoma ms frecuente de la bronquitis.  Cuando la bronquitis es aguda, generalmente comienza de manera sbita y desaparece luego de un par de semanas. El hbito de fumar, las alergias y el  asma pueden empeorar la bronquitis. Los episodios repetidos de bronquitis pueden causar ms problemas pulmonares.  CAUSAS La causa ms frecuente de bronquitis aguda es el mismo virus que produce el resfro. El virus puede propagarse de una persona a la otra (contagioso) a travs de la tos y los estornudos, y al tocar objetos contaminados. SIGNOS Y SNTOMAS   Tos.  Fiebre.  Tos con mucosidad.  Dolores en el cuerpo.  Congestin en el pecho.  Escalofros.  Falta de aire.  Dolor de garganta. DIAGNSTICO  La bronquitis aguda en general se diagnostica con un examen fsico. El mdico tambin le har preguntas sobre su historia clnica. En algunos casos se indican otros estudios, como radiografas, para descartar otras enfermedades.  TRATAMIENTO  La bronquitis aguda generalmente desaparece en un par de semanas. Con frecuencia, no es necesario realizar un tratamiento. Los medicamentos se indican para aliviar la fiebre o la tos. Generalmente, no es necesario el uso de antibiticos, pero pueden recetarse en ciertas ocasiones. En algunos casos, se recomienda el uso de un inhalador para mejorar la falta de aire y controlar la tos. Un vaporizador de aire fro podr ayudarlo a disolver las secreciones bronquiales y facilitar su eliminacin.  INSTRUCCIONES PARA EL CUIDADO EN EL HOGAR  Descanse lo suficiente.  Beba lquidos en abundancia para mantener la orina de color claro o amarillo plido (excepto que padezca una enfermedad que requiera la restriccin de lquidos). El aumento de lquidos puede ayudar a que las secreciones respiratorias (esputo) sean menos espesas y a reducir la congestin del pecho, y evitar la deshidratacin.  Tome los medicamentos solamente como se lo haya indicado el mdico.  Si le recetaron antibiticos, asegrese de terminarlos, incluso si comienza a sentirse mejor.  Evite fumar o aspirar el humo de otros fumadores. La exposicin al humo del cigarrillo o a irritantes  qumicos har que la bronquitis empeore. Si fuma, considere el uso de goma de mascar o la aplicacin de parches en la piel que contengan nicotina para aliviar los sntomas de abstinencia. Si deja de fumar, sus pulmones se curarn ms rpido.  Reduzca la probabilidad de otro episodio de bronquitis aguda lavando sus manos con frecuencia, evitando a las personas que tengan sntomas y tratando de no tocarse las manos con la boca, la nariz o los ojos.  Concurra a todas las visitas de control como se lo haya indicado el mdico. SOLICITE ATENCIN MDICA SI: Los sntomas no mejoran despus de una semana de tratamiento.  SOLICITE ATENCIN MDICA DE INMEDIATO SI:  Comienza a tener fiebre o escalofros cada vez ms intensos.  Siente dolor en el pecho.  Le falta el aire de manera preocupante.  La flema tiene sangre.  Se deshidrata.  Se desmaya o siente que va a desmayarse de forma repetida.  Tiene vmitos que se repiten.  Tiene un dolor de cabeza intenso. ASEGRESE DE QUE:   Comprende estas instrucciones.  Controlar su afeccin.  Recibir ayuda de inmediato si no mejora o si empeora. Document Released: 05/26/2005 Document Revised: 10/10/2013 ExitCare Patient Information 2015 [employer]. This information is not intended to replace advice given to you by your health care provider. Make sure you discuss any questions you have with your health care provider.  

## 2015-07-19 ENCOUNTER — Encounter: Payer: Self-pay | Admitting: Internal Medicine

## 2015-07-19 ENCOUNTER — Ambulatory Visit (INDEPENDENT_AMBULATORY_CARE_PROVIDER_SITE_OTHER): Payer: No Typology Code available for payment source | Admitting: Internal Medicine

## 2015-07-19 VITALS — BP 134/75 | HR 66 | Temp 98.2°F | Ht 65.0 in | Wt 217.6 lb

## 2015-07-19 DIAGNOSIS — Z833 Family history of diabetes mellitus: Secondary | ICD-10-CM

## 2015-07-19 DIAGNOSIS — M25561 Pain in right knee: Secondary | ICD-10-CM

## 2015-07-19 DIAGNOSIS — M25562 Pain in left knee: Secondary | ICD-10-CM

## 2015-07-19 DIAGNOSIS — Z0000 Encounter for general adult medical examination without abnormal findings: Secondary | ICD-10-CM | POA: Insufficient documentation

## 2015-07-19 DIAGNOSIS — Z862 Personal history of diseases of the blood and blood-forming organs and certain disorders involving the immune mechanism: Secondary | ICD-10-CM

## 2015-07-19 HISTORY — DX: Pain in right knee: M25.561

## 2015-07-19 HISTORY — DX: Pain in right knee: M25.562

## 2015-07-19 MED ORDER — IBUPROFEN 600 MG PO TABS: 600.0000 mg | ORAL_TABLET | Freq: Four times a day (QID) | ORAL | Status: DC | PRN

## 2015-07-19 NOTE — Progress Notes (Signed)
Patient ID: Monica Wolfe, female   DOB: 10/06/1985, 30 y.o.   MRN: [id number]   Subjective:   Patient ID: Monica Wolfe female   DOB: 1985/07/07 30 y.o.   MRN: [id number]  HPI: [name] is a 30 y.o. no medical problems. Presented today with complaints of pain in both knees, worse in the right knee. Started 3 weeks ago, when she was doing exercise- started doing more aggressive exercise. Pain is worse at night, feels her knees lock, and has popping/creaking. Pain is aggravated by walking. No prior trauma to knees. She endorses swelling in both legs, feet for 1 week. She has tried topical medication OTC without relief, also tried advil- 2 pills OTC two times a day, which helped, for a while.  No fever, chills, rash, no hair changes. No involvement if her fingers- no redness, pain or swelling. No back pain. Weight- has been stable, no change. She never had a previous provider. Never smoked cigs. No alcohol, no drugs. Has 4 kids.  Maternal Grandmother- Diabetes.  No past medical history on file. Current Outpatient Prescriptions  Medication Sig Dispense Refill  . benzonatate (TESSALON) 100 MG capsule Take 2 capsules (200 mg total) by mouth 2 (two) times daily as needed for cough. 20 capsule 0  . HYDROcodone-acetaminophen (NORCO) 10-325 MG per tablet Take 1 tablet by mouth every 6 (six) hours as needed. 15 tablet 0  . predniSONE (DELTASONE) 20 MG tablet Take 2 tablets (40 mg total) by mouth daily. 10 tablet 0   No current facility-administered medications for this visit.   Review of Systems: CONSTITUTIONAL- No Fever, weightloss, night sweat or change in appetite. SKIN- No Rash, colour changes or itching. HEAD- No Headache or dizziness. EYES- No Vision loss, pain, redness, double or blurred vision. RESPIRATORY- No Cough or SOB. CARDIAC- No Palpitations, or chest pain. GI- No vomiting, diarrhoea, abd pain. URINARY- No Frequency, or dysuria. NEUROLOGIC- No  Numbness, or burning.  Objective:  Physical Exam: Filed Vitals:   07/19/15 1556  BP: 134/75  Pulse: 66  Temp: 98.2 F (36.8 C)  TempSrc: Oral  Height:  (1.651 m)  Weight: 217 lb 9.6 oz (98.703 kg)  SpO2: 100%   GENERAL- alert, co-operative, appears as stated age, not in any distress. HEENT- Atraumatic, normocephalic, PERRL, EOMI, oral mucosa appears moist, neck supple. CARDIAC- RRR, no murmurs, rubs or gallops. RESP- Moving equal volumes of air, and clear to auscultation bilaterally, no wheezes or crackles. ABDOMEN- Soft, nontender, bowel sounds present. BACK- Normal curvature of the spine, No tenderness along the vertebrae, no CVA tenderness. NEURO- No obvious Cr N abnormality, strenght upper and lower extremities, Gait- Normal. EXTREMITIES- warm and well perfused, no pedal edema, both knees symmetric, no swelling, no warmth, no redness, tenderness femoral-tibial line, normal range of motion, no crepitus. SKIN- Warm, dry, No rash or lesion. PSYCH- Normal mood and affect, appropriate thought content and speech.  Assessment & Plan:   The patient's case and plan of care was discussed with attending physician, Dr. Criselda Peaches.  Please see problem based charting for assessment and plan.

## 2015-07-19 NOTE — Assessment & Plan Note (Signed)
Last blood work in 2015 showed anemia- Hgb- 10, 11, also with hyperkalemia, most likely related to delivery. Will check BMET, CBC. - Follow up with PAP smear next visit.

## 2015-07-19 NOTE — Patient Instructions (Signed)
You most likely sprained your knees with the exercise you were doing. I have prescribed a stronger strenght of ibuprofen, take 1 tablet 4 times a day as needed for the pain.   It is very important that you rest your knees for the next 3 weeks, after this you can gradually increase the amount of exercise you are doing. Also work on [employer]. Consider swimming in the summer instead of walking or running too much.

## 2015-07-19 NOTE — Assessment & Plan Note (Signed)
Most likely from knee sprain from excess activity/sudden aggressive exercise. No alarm symptoms. Ibuprofen helped the pain.   Plan- rest and ice for now - Ibuprofen  QID as needed - Gradual increase in exercise - Consider swimming in the summer - Possibly knee brace when running.

## 2015-07-20 LAB — BMP8+ANION GAP
Anion Gap: 19 mmol/L — ABNORMAL HIGH (ref 10.0–18.0)
BUN/Creatinine Ratio: 14 (ref 8–20)
BUN: 8 mg/dL (ref 6–20)
CO2: 21 mmol/L (ref 18–29)
Calcium: 9.2 mg/dL (ref 8.7–10.2)
Chloride: 98 mmol/L (ref 96–106)
Creatinine, Ser: 0.56 mg/dL — ABNORMAL LOW (ref 0.57–1.00)
GFR calc Af Amer: 148 mL/min/{1.73_m2} (ref >59)
GFR, EST NON AFRICAN AMERICAN: 128 mL/min/{1.73_m2} (ref >59)
Glucose: 114 mg/dL — ABNORMAL HIGH (ref 65–99)
Potassium: 4.3 mmol/L (ref 3.5–5.2)
SODIUM: 138 mmol/L (ref 134–144)

## 2015-07-20 LAB — CBC
HEMATOCRIT: 39.5 % (ref 34.0–46.6)
Hemoglobin: 13.1 g/dL (ref 11.1–15.9)
MCH: 30.3 pg (ref 26.6–33.0)
MCHC: 33.2 g/dL (ref 31.5–35.7)
MCV: 91 fL (ref 79–97)
PLATELETS: 254 10*3/uL (ref 150–379)
RBC: 4.33 x10E6/uL (ref 3.77–5.28)
RDW: 13.3 % (ref 12.3–15.4)
WBC: 7.3 10*3/uL (ref 3.4–10.8)

## 2015-07-24 NOTE — Progress Notes (Signed)
Internal Medicine Clinic Attending  Case discussed with Dr. Emokpae soon after the resident saw the patient.  We reviewed the resident's history and exam and pertinent patient test results.  I agree with the assessment, diagnosis, and plan of care documented in the resident's note. 

## 2015-07-24 NOTE — Addendum Note (Signed)
Addended by: Debe Coder B on: 07/24/2015 10:21 AM   Modules accepted: Level of Service

## 2015-08-06 ENCOUNTER — Encounter: Payer: Self-pay | Admitting: Internal Medicine

## 2015-08-06 ENCOUNTER — Ambulatory Visit: Payer: No Typology Code available for payment source | Admitting: Internal Medicine

## 2015-09-24 ENCOUNTER — Emergency Department (HOSPITAL_COMMUNITY)
Admission: EM | Admit: 2015-09-24 | Discharge: 2015-09-24 | Disposition: A | Discharge status: Home / Self Care | Payer: No Typology Code available for payment source | Attending: Emergency Medicine | Admitting: Emergency Medicine

## 2015-09-24 ENCOUNTER — Encounter (HOSPITAL_COMMUNITY): Payer: Self-pay | Admitting: *Deleted

## 2015-09-24 DIAGNOSIS — Z7952 Long term (current) use of systemic steroids: Secondary | ICD-10-CM | POA: Insufficient documentation

## 2015-09-24 DIAGNOSIS — M25561 Pain in right knee: Secondary | ICD-10-CM

## 2015-09-24 DIAGNOSIS — M25562 Pain in left knee: Secondary | ICD-10-CM | POA: Insufficient documentation

## 2015-09-24 MED ORDER — NAPROXEN 500 MG PO TABS: 500.0000 mg | ORAL_TABLET | Freq: Two times a day (BID) | ORAL | Status: DC

## 2015-09-24 MED ORDER — KETOROLAC TROMETHAMINE 30 MG/ML IJ SOLN
30.0000 mg | Freq: Once | INTRAMUSCULAR | Status: AC
  Administered 2015-09-24: 30 mg via INTRAMUSCULAR
  Filled 2015-09-24: qty 1

## 2015-09-24 NOTE — ED Notes (Signed)
Declined W/C at D/C and was escorted to lobby by RN. 

## 2015-09-24 NOTE — ED Notes (Signed)
Pt reports kn ee pain 1 week ago . Pt reports taking Advil with out result.

## 2015-09-24 NOTE — Discharge Instructions (Signed)
Please call Dr. Ophelia CharterYates' office to schedule an orthopedic follow up evaluation. In the meantime take naproxen as needed for pain. Keep your legs elevated when resting at home. Use ice on and off as needed for pain and swelling. Return to the ER for new or worsening symptoms.

## 2015-09-24 NOTE — ED Provider Notes (Signed)
CSN: [id number]     Arrival date & time 09/24/15  [id number] History  By signing my name below, I, Essence Howell, attest that this documentation has been prepared under the direction and in the presence of Noelle PennerSerena Quandarius Nill, PA-C Electronically Signed: Charline BillsEssence Howell, ED Scribe 09/24/2015 at 9:30 AM.   Chief Complaint  Patient presents with  . Knee Pain   The history is provided by the patient. No language interpreter was used.   HPI Comments: Monica Wolfe is a 30 y.o. female who presents to the Emergency Department complaining of gradually worsening bilateral knee pain for the past week. Pt reports increased pain with bearing weight. She denies injury or prolonged standing. Denies sports play. Denies fever, chills, weakness. She has been seen for the same by her PCP who advised Advil and ice daily which has not provided significant relief.   History reviewed. No pertinent past medical history. Past Surgical History  Procedure Laterality Date  . Lithotripsy  10/17/2013   Family History  Problem Relation Age of Onset  . Diabetes Maternal Grandmother    Social History  Substance Use Topics  . Smoking status: Never Smoker   . Smokeless tobacco: None  . Alcohol Use: No   OB History    No data available     Review of Systems  Constitutional: Negative for fever.  Musculoskeletal: Positive for arthralgias.  All other systems reviewed and are negative.  Allergies  Review of patient's allergies indicates no known allergies.  Home Medications   Prior to Admission medications   Medication Sig Start Date End Date Taking? Authorizing Provider  benzonatate (TESSALON) 100 MG capsule Take 2 capsules (200 mg total) by mouth 2 (two) times daily as needed for cough. 09/20/14   Roxy Horsemanobert Browning, PA-C  HYDROcodone-acetaminophen (NORCO) 10-325 MG per tablet Take 1 tablet by mouth every 6 (six) hours as needed. 10/23/13   Gerhard Munchobert Lockwood, MD  ibuprofen (ADVIL,MOTRIN) 600 MG tablet Take 1 tablet (600  mg total) by mouth every 6 (six) hours as needed. 07/19/15   Ejiroghene Wendall StadeE Emokpae, MD  predniSONE (DELTASONE) 20 MG tablet Take 2 tablets (40 mg total) by mouth daily. 09/20/14   Roxy Horsemanobert Browning, PA-C   BP 128/94 mmHg  Pulse 64  Temp(Src) 98.2 F (36.8 C) (Oral)  Resp 18  Wt 206 lb (93.441 kg)  SpO2 100%  LMP 09/24/2015 Physical Exam  Constitutional: She is oriented to person, place, and time. She appears well-developed and well-nourished. No distress.  HENT:  Head: Normocephalic and atraumatic.  Eyes: Conjunctivae and EOM are normal.  Neck: Neck supple. No tracheal deviation present.  Cardiovascular: Normal rate.   Pulmonary/Chest: Effort normal. No respiratory distress.  Musculoskeletal: Normal range of motion.  Bilateral knees: non-edematous, non-erythematous. No palpable effusion. No tenderness. FROM. No laxity or crepitus. Strength intact.  Neurological: She is alert and oriented to person, place, and time.  Skin: Skin is warm and dry.  Psychiatric: She has a normal mood and affect. Her behavior is normal.  Nursing note and vitals reviewed.  ED Course  Procedures (including critical care time) DIAGNOSTIC STUDIES: Oxygen Saturation is 100% on RA, normal by my interpretation.    COORDINATION OF CARE: 9:27 AM-Discussed treatment plan which includes Toradol injection, Naproxen and referral to ortho with pt at bedside and pt agreed to plan.   Labs Review Labs Reviewed - No data to display  Imaging Review No results found.   EKG Interpretation None      MDM  Final diagnoses:  Bilateral knee pain    Suspect inflammatory etiology to pt's pain. Likely exacerbated by body habitus. Will hold off on imaging today. Exam nonfocal with no fever, erythema, edema to suggest septic joint.  Will try naproxen instead of ibuprofen. Referral to ortho given. ER return precautions given.  I personally performed the services described in this documentation, which was scribed in my  presence. The recorded information has been reviewed and is accurate.   Carlene Coria, PA-C 09/24/15 1039  Rolland Porter, MD 10/02/15 1501

## 2016-04-10 ENCOUNTER — Ambulatory Visit: Payer: Self-pay

## 2016-04-21 ENCOUNTER — Ambulatory Visit: Payer: Self-pay

## 2019-04-22 ENCOUNTER — Other Ambulatory Visit: Payer: Self-pay | Admitting: Obstetrics & Gynecology

## 2019-04-22 DIAGNOSIS — N6325 Unspecified lump in the left breast, overlapping quadrants: Secondary | ICD-10-CM

## 2019-04-29 ENCOUNTER — Ambulatory Visit
Admission: RE | Admit: 2019-04-29 | Discharge: 2019-04-29 | Disposition: A | Discharge status: Home / Self Care | Payer: Self-pay | Source: Ambulatory Visit | Attending: Obstetrics & Gynecology | Admitting: Obstetrics & Gynecology

## 2019-04-29 ENCOUNTER — Other Ambulatory Visit: Payer: Self-pay

## 2019-04-29 DIAGNOSIS — N6325 Unspecified lump in the left breast, overlapping quadrants: Secondary | ICD-10-CM

## 2019-05-10 ENCOUNTER — Other Ambulatory Visit: Payer: Self-pay

## 2019-05-10 ENCOUNTER — Encounter: Payer: Self-pay | Admitting: Obstetrics and Gynecology

## 2019-05-10 ENCOUNTER — Ambulatory Visit (INDEPENDENT_AMBULATORY_CARE_PROVIDER_SITE_OTHER): Payer: Self-pay | Admitting: Obstetrics and Gynecology

## 2019-05-10 VITALS — BP 142/88 | HR 70 | Wt 216.2 lb

## 2019-05-10 DIAGNOSIS — O09299 Supervision of pregnancy with other poor reproductive or obstetric history, unspecified trimester: Secondary | ICD-10-CM

## 2019-05-10 DIAGNOSIS — O24410 Gestational diabetes mellitus in pregnancy, diet controlled: Secondary | ICD-10-CM

## 2019-05-10 DIAGNOSIS — N649 Disorder of breast, unspecified: Secondary | ICD-10-CM

## 2019-05-10 DIAGNOSIS — O10012 Pre-existing essential hypertension complicating pregnancy, second trimester: Secondary | ICD-10-CM

## 2019-05-10 DIAGNOSIS — Z87442 Personal history of urinary calculi: Secondary | ICD-10-CM

## 2019-05-10 DIAGNOSIS — Z603 Acculturation difficulty: Secondary | ICD-10-CM

## 2019-05-10 DIAGNOSIS — Z789 Other specified health status: Secondary | ICD-10-CM | POA: Insufficient documentation

## 2019-05-10 DIAGNOSIS — Z3A16 16 weeks gestation of pregnancy: Secondary | ICD-10-CM

## 2019-05-10 DIAGNOSIS — I10 Essential (primary) hypertension: Secondary | ICD-10-CM | POA: Insufficient documentation

## 2019-05-10 DIAGNOSIS — O0992 Supervision of high risk pregnancy, unspecified, second trimester: Secondary | ICD-10-CM

## 2019-05-10 DIAGNOSIS — O09292 Supervision of pregnancy with other poor reproductive or obstetric history, second trimester: Secondary | ICD-10-CM

## 2019-05-10 DIAGNOSIS — Z758 Other problems related to medical facilities and other health care: Secondary | ICD-10-CM

## 2019-05-10 DIAGNOSIS — O0990 Supervision of high risk pregnancy, unspecified, unspecified trimester: Secondary | ICD-10-CM | POA: Insufficient documentation

## 2019-05-10 HISTORY — DX: Disorder of breast, unspecified: N64.9

## 2019-05-10 HISTORY — DX: Acculturation difficulty: Z60.3

## 2019-05-10 HISTORY — DX: Essential (primary) hypertension: I10

## 2019-05-10 HISTORY — DX: Other problems related to medical facilities and other health care: Z75.8

## 2019-05-10 HISTORY — DX: Supervision of pregnancy with other poor reproductive or obstetric history, unspecified trimester: O09.299

## 2019-05-10 MED ORDER — ASPIRIN 81 MG PO CHEW: 81.0000 mg | CHEWABLE_TABLET | Freq: Every day | ORAL | Status: DC

## 2019-05-10 NOTE — Progress Notes (Signed)
INITIAL PRENATAL VISIT NOTE  Subjective:  Monica Wolfe is a 33 y.o. [id number] at 85w1dby 8 week [place] seen today for her initial prenatal visit. This is a planned pregnancy. She and partner are happy with the pregnancy. She has an obstetric history significant for 4 x SVD. [place] 1[place]. She has a medical history significant for cHTN, kidney stones.  Patient reports no complaints.  Contractions: Not present. Vag. Bleeding: None.  Movement: Absent. Denies leaking of fluid.   Past Medical History:  Diagnosis Date  . Chronic hypertension 05/10/2019  . Diabetes mellitus without complication ([facility]    Past Surgical History:  Procedure Laterality Date  . LITHOTRIPSY  10/17/2013    OB History  Gravida Para Term Preterm AB Living  '7 4 4   2 4  ' SAB TAB Ectopic Multiple Live Births  2       4    # Outcome Date GA Lbr Len/2nd Weight Sex Delivery Anes PTL Lv  7 Current           6 Term      Vag-Spont   LIV  5 Term           4 Term           3 Term           2 SAB           1 SAB             Social History   Socioeconomic History  . Marital status: Single    Spouse name: Not on file  . Number of children: Not on file  . Years of education: Not on file  . Highest education level: Not on file  Occupational History  . Not on file  Social Needs  . Financial resource strain: Not on file  . Food insecurity    Worry: Not on file    Inability: Not on file  . Transportation needs    Medical: Not on file    Non-medical: Not on file  Tobacco Use  . Smoking status: Never Smoker  Substance and Sexual Activity  . Alcohol use: No  . Drug use: No  . Sexual activity: Yes    Birth control/protection: Implant  Lifestyle  . Physical activity    Days per week: Not on file    Minutes per session: Not on file  . Stress: Not on file  Relationships  . Social c[place] phone: Not on file    Gets together: Not on file    Attends religious service: Not  on file    Active member of club or organization: Not on file    Attends meetings of clubs or organizations: Not on file    Relationship status: Not on file  Other Topics Concern  . Not on file  Social History Narrative  . Not on file    Family History  Problem Relation Age of Onset  . Diabetes Maternal Grandmother      Current Outpatient Medications:  .  Prenatal Vit-Fe Fumarate-FA (PRENATAL MULTIVITAMIN) TABS tablet, Take 1 tablet by mouth daily at 12 noon., Disp: , Rfl:   Current Facility-Administered Medications:  .  aspirin chewable tablet 81 mg, 81 mg, Oral, Daily, DSloan Leiter MD  No Known Allergies  Review of Systems: Negative except for what is mentioned in HPI.  Objective:   Vitals:   05/10/19 0903 05/10/19 07628  BP: (!) 142/88   Pulse: 64 70  Weight: 216 lb 3.2 oz (98.1 kg)    Fetal Status: Fetal Heart Rate (bpm): 156   Movement: Absent     Physical Exam: BP (!) 142/88   Pulse 70   Wt 216 lb 3.2 oz (98.1 kg)   LMP 01/17/2019   BMI 35.98 kg/m  CONSTITUTIONAL: Well-developed, well-nourished female in no acute distress.  NEUROLOGIC: Alert and oriented to person, place, and time. Normal reflexes, muscle tone coordination. No cranial nerve deficit noted. PSYCHIATRIC: Normal mood and affect. Normal behavior. Normal judgment and thought content. SKIN: Skin is warm and dry. No rash noted. Not diaphoretic. No erythema. No pallor. HENT:  Normocephalic, atraumatic, External right and left ear normal. Oropharynx is clear and moist EYES: Conjunctivae and EOM are normal. Pupils are equal, round, and reactive to light. No scleral icterus.  NECK: Normal range of motion, supple, no masses CARDIOVASCULAR: Normal heart rate noted, regular rhythm RESPIRATORY: Effort and breath sounds normal, no problems with respiration noted BREASTS: deferred ABDOMEN: Soft, nontender, nondistended, gravid. GU: deferred MUSCULOSKELETAL: Normal range of motion. EXT:  No edema and no  tenderness. 2+ distal pulses.   Assessment and Plan:  Pregnancy: [id number] at 31w1dby 8 weeks [place] 1. Supervision of high risk pregnancy, antepartum [facility]ed [facility], multiple providers, fellows, medical students, virtual visits, MyChart.   2. Chronic hypertension Was on meds prior to last pregnancy but was taken off as she stablized - Comp Met (CMET) - Protein / creatinine ratio, urine - start baby Asa  3. Diet controlled gestational diabetes mellitus (GDM) in first trimester [facility]t [facility]/17/19 - Referral to Nutrition and Diabetes Services (DM or BMI)  4. Breast lesion Was told there was inflammation in the breast, nothing to do, but follow up as needed  5. H/O postpartum hemorrhage, currently pregnant With 1st delivery Had to get blood transfusion Reports she had a "seizure" due to blood loss Denies being diagnosed with pre-eclampsia/eclampsia No other diagnosis of eclampsia/pre-eclampsia - start on baby ASA  6. History of kidney stones Some pain today, not severe, advised to drink lots of water and call if she has pain - Urine Culture  7. Language barrier Spanish translator   Preterm labor symptoms and general obstetric precautions including but not limited to vaginal bleeding, contractions, leaking of fluid and fetal movement were reviewed in detail with the patient.  Please refer to After Visit Summary for other counseling recommendations.   Return in about 2 weeks (around 05/24/2019) for high OB, virtual.  KSloan Leiter12/06/2018 9:32 AM

## 2019-05-11 LAB — COMPREHENSIVE METABOLIC PANEL
ALT: 10 IU/L (ref 0–32)
AST: 14 IU/L (ref 0–40)
Albumin/Globulin Ratio: 1.3 (ref 1.2–2.2)
Albumin: 4 g/dL (ref 3.8–4.8)
Alkaline Phosphatase: 51 IU/L (ref 39–117)
BUN/Creatinine Ratio: 14 (ref 9–23)
BUN: 7 mg/dL (ref 6–20)
Bilirubin Total: 0.4 mg/dL (ref 0.0–1.2)
CO2: 19 mmol/L — ABNORMAL LOW (ref 20–29)
Calcium: 9.3 mg/dL (ref 8.7–10.2)
Chloride: 103 mmol/L (ref 96–106)
Creatinine, Ser: 0.5 mg/dL — ABNORMAL LOW (ref 0.57–1.00)
GFR calc Af Amer: 149 mL/min/{1.73_m2} (ref >59)
GFR calc non Af Amer: 129 mL/min/{1.73_m2} (ref >59)
Globulin, Total: 3.2 g/dL (ref 1.5–4.5)
Glucose: 86 mg/dL (ref 65–99)
Potassium: 4.9 mmol/L (ref 3.5–5.2)
Sodium: 135 mmol/L (ref 134–144)
Total Protein: 7.2 g/dL (ref 6.0–8.5)

## 2019-05-11 LAB — PROTEIN / CREATININE RATIO, URINE
Creatinine, Urine: 269.9 mg/dL
Protein, Ur: 53.6 mg/dL
Protein/Creat Ratio: 199 mg/g creat (ref 0–200)

## 2019-05-11 LAB — URINE CULTURE

## 2019-05-17 ENCOUNTER — Encounter: Payer: Self-pay | Attending: Family Medicine | Admitting: *Deleted

## 2019-05-17 ENCOUNTER — Other Ambulatory Visit: Payer: Self-pay

## 2019-05-17 ENCOUNTER — Ambulatory Visit: Payer: Self-pay | Admitting: *Deleted

## 2019-05-17 DIAGNOSIS — Z713 Dietary counseling and surveillance: Secondary | ICD-10-CM | POA: Insufficient documentation

## 2019-05-17 DIAGNOSIS — O24410 Gestational diabetes mellitus in pregnancy, diet controlled: Secondary | ICD-10-CM | POA: Insufficient documentation

## 2019-05-17 NOTE — Progress Notes (Signed)
  Patient was seen on 05/17/2019 for Gestational Diabetes self-management. EDD 10/24/2019. Patient speaks Spanish, we had live interpretor for first half of our visit but she had to leave so we used [employer] interpretor # [id number] for the remainder of the appointment. Patient states no history of GDM. Diet history obtained. Patient eats good variety of all food groups. Beverages include only water.  The following learning objectives were met by the patient :   States the definition of Gestational Diabetes  States why dietary management is important in controlling blood glucose  Describes the effects of carbohydrates on blood glucose levels  Demonstrates ability to create a balanced meal plan  Demonstrates carbohydrate counting   States when to check blood glucose levels  Demonstrates proper blood glucose monitoring techniques  States the effect of stress and exercise on blood glucose levels  States the importance of limiting caffeine and abstaining from alcohol and smoking  Plan:  Aim for 3 Carb Choices per meal (45 grams) +/- 1 either way  Aim for 1-2 Carbs per snack Begin reading food labels for Total Carbohydrate of foods If OK with your MD, consider  increasing your activity level by walking, Arm Chair Exercises or other activity daily as tolerated Begin checking BG before breakfast and 2 hours after first bite of breakfast, lunch and dinner as directed by MD  Bring Log Book/Sheet and meter to every medical appointment OR use Baby Scripts (see below) Baby Scripts:  Patient not appropriate for [employer] due to language barrier  Take medication if directed by MD  Blood glucose monitor given: Prodigy  Lot # [id number] Blood glucose reading: 82 mg/dl Patient instructed to test pre breakfast and 2 hours each meal as directed by MD  Patient instructed to monitor glucose levels: FBS: 60 - 95 mg/dl 2 hour: <120 mg/dl  Patient received the following handouts: in Spanish  Nutrition  Diabetes and Pregnancy  Carbohydrate Counting List  BG Log Sheet  Patient will be seen for follow-up in 4-6 weeks or as needed.

## 2019-05-25 ENCOUNTER — Ambulatory Visit (INDEPENDENT_AMBULATORY_CARE_PROVIDER_SITE_OTHER): Payer: Self-pay | Admitting: Obstetrics & Gynecology

## 2019-05-25 VITALS — BP 129/92 | HR 77

## 2019-05-25 DIAGNOSIS — O10912 Unspecified pre-existing hypertension complicating pregnancy, second trimester: Secondary | ICD-10-CM

## 2019-05-25 DIAGNOSIS — Z3A18 18 weeks gestation of pregnancy: Secondary | ICD-10-CM

## 2019-05-25 DIAGNOSIS — O0992 Supervision of high risk pregnancy, unspecified, second trimester: Secondary | ICD-10-CM

## 2019-05-25 DIAGNOSIS — O99212 Obesity complicating pregnancy, second trimester: Secondary | ICD-10-CM

## 2019-05-25 DIAGNOSIS — O99210 Obesity complicating pregnancy, unspecified trimester: Secondary | ICD-10-CM

## 2019-05-25 DIAGNOSIS — Z789 Other specified health status: Secondary | ICD-10-CM

## 2019-05-25 DIAGNOSIS — I10 Essential (primary) hypertension: Secondary | ICD-10-CM

## 2019-05-25 DIAGNOSIS — O0990 Supervision of high risk pregnancy, unspecified, unspecified trimester: Secondary | ICD-10-CM

## 2019-05-25 DIAGNOSIS — O24410 Gestational diabetes mellitus in pregnancy, diet controlled: Secondary | ICD-10-CM

## 2019-05-25 NOTE — Progress Notes (Signed)
TELEHEALTH OBSTETRICS PRENATAL VIRTUAL VIDEO VISIT ENCOUNTER NOTE  Provider location: [facility] [place]   I connected with Monica Wolfe on 05/25/19 at  3:15 PM EST by MyChart Video Encounter at home and verified that I am speaking with the correct person using two identifiers.   I discussed the limitations, risks, security and privacy concerns of performing an evaluation and management service virtually and the availability of in person appointments. I also discussed with the patient that there may be a patient responsible charge related to this service. The patient expressed understanding and agreed to proceed. Subjective:  Monica Wolfe is a 33 y.o. [id number] at [redacted]w[redacted]d being seen today for ongoing prenatal care.  She is currently monitored for the following issues for this high-risk pregnancy and has Bilateral knee pain; Health care maintenance; Supervision of high risk pregnancy, antepartum; Chronic hypertension; Breast lesion; H/O postpartum hemorrhage, currently pregnant; and Language barrier on their problem list.  Patient reports no complaints.   .  .   . Denies any leaking of fluid.   The following portions of the patient's history were reviewed and updated as appropriate: allergies, current medications, past family history, past medical history, past social history, past surgical history and problem list.   Objective:  There were no vitals filed for this visit.  Fetal Status:           General:  Alert, oriented and cooperative. Patient is in no acute distress.  Respiratory: Normal respiratory effort, no problems with respiration noted  Mental Status: Normal mood and affect. Normal behavior. Normal judgment and thought content.  Rest of physical exam deferred due to type of encounter  Imaging: US BREAST LTD UNI LEFT INC AXILLA  Result Date: 04/29/2019 CLINICAL DATA:  Mass felt on recent physical examination in the 3 o'clock position of  the left breast. The patient cannot feel the mass today. She reports a left axillary mass for the past 6 months that fluctuates in size, with drainage. She is [redacted] weeks pregnant. No family history of breast cancer. EXAM: ULTRASOUND OF THE LEFT BREAST COMPARISON:  Previous exam(s). FINDINGS: On physical exam, there is an approximately 8 mm rounded area of focal soft tissue thickening in the 3 o'clock position of the left breast, 9 cm from the nipple. There is a 1.5 x 1.0 cm elongated palpable mass in the left axilla with an approximately 8 mm oval protruding area with diffuse red coloration laterally. Targeted ultrasound is performed, showing normal appearing breast tissue throughout the 3 o'clock position of the left breast with no cystic or solid masses seen. Ultrasound of the left axilla demonstrated a 3.2 x 2.4 x 0.5 cm oval, hypoechoic fluid collection within an area of skin thickening in the left axilla, extending to the skin surface laterally. Normal appearing left axillary lymph nodes were also demonstrated. IMPRESSION: 1. No evidence of malignancy in the 3 o'clock position of the left breast. The focal area of palpable soft tissue thickening corresponds to normal fibroglandular tissue at ultrasound. 2. 3.2 x 2.4 x 0.5 cm left axillary dermal fluid collection compatible with a sebaceous cyst (epidermal inclusion cyst) that has eroded through skin. RECOMMENDATION: 1. Annual screening mammography beginning at age 63. 2. Consideration of surgical excision or incision and drainage of the left axillary dermal cyst. I have discussed the findings and recommendations with the patient. If applicable, a reminder letter will be sent to the patient regarding the next appointment. BI-RADS CATEGORY  2: Benign.  Electronically Signed   By: Claudie Revering M.D.   On: 04/29/2019 17:01    Assessment and Plan:  Pregnancy: [id number] at [redacted]w[redacted]d 1. Supervision of high risk pregnancy, antepartum   2. Language barrier - Spanish  interpretor  3. Obesity in pregnancy - rec no more than 20 # total weight gain - Hemoglobin A1c; Future  4. Chronic hypertension - baby asa - no meds  5. Diet controlled gestational diabetes mellitus (GDM) in first trimester - she reports fastings less than 88, 2 hours- some in range and some significatntly out of range (but she can't show them to me.  Preterm labor symptoms and general obstetric precautions including but not limited to vaginal bleeding, contractions, leaking of fluid and fetal movement were reviewed in detail with the patient. I discussed the assessment and treatment plan with the patient. The patient was provided an opportunity to ask questions and all were answered. The patient agreed with the plan and demonstrated an understanding of the instructions. The patient was advised to call back or seek an in-person office evaluation/go to MAU at [facility] for any urgent or concerning symptoms. Please refer to After Visit Summary for other counseling recommendations.   I provided 10 minutes of face-to-face time during this encounter.  No follow-ups on file.  Future Appointments  Date Time Provider [place]  06/28/2019 11:15 AM [place], [place] for [employer], [place]

## 2019-05-25 NOTE — Progress Notes (Signed)
I connected with  Monica Wolfe on 05/25/19 at  3:15 PM EST by telephone and verified that I am speaking with the correct person using two identifiers.   I discussed the limitations, risks, security and privacy concerns of performing an evaluation and management service by telephone and the availability of in person appointments. I also discussed with the patient that there may be a patient responsible charge related to this service. The patient expressed understanding and agreed to proceed.  Spanish Interpreter Pleas Koch, RN 05/25/2019  3:29 PM

## 2019-05-27 ENCOUNTER — Other Ambulatory Visit: Payer: Self-pay

## 2019-05-31 ENCOUNTER — Ambulatory Visit (INDEPENDENT_AMBULATORY_CARE_PROVIDER_SITE_OTHER): Payer: Self-pay | Admitting: Obstetrics & Gynecology

## 2019-05-31 ENCOUNTER — Other Ambulatory Visit: Payer: Self-pay

## 2019-05-31 VITALS — BP 123/86 | HR 76 | Wt 217.0 lb

## 2019-05-31 DIAGNOSIS — Z3A19 19 weeks gestation of pregnancy: Secondary | ICD-10-CM

## 2019-05-31 DIAGNOSIS — O24410 Gestational diabetes mellitus in pregnancy, diet controlled: Secondary | ICD-10-CM

## 2019-05-31 DIAGNOSIS — Z789 Other specified health status: Secondary | ICD-10-CM

## 2019-05-31 DIAGNOSIS — O0992 Supervision of high risk pregnancy, unspecified, second trimester: Secondary | ICD-10-CM

## 2019-05-31 DIAGNOSIS — O10012 Pre-existing essential hypertension complicating pregnancy, second trimester: Secondary | ICD-10-CM

## 2019-05-31 DIAGNOSIS — I10 Essential (primary) hypertension: Secondary | ICD-10-CM

## 2019-05-31 DIAGNOSIS — O99210 Obesity complicating pregnancy, unspecified trimester: Secondary | ICD-10-CM

## 2019-05-31 DIAGNOSIS — O0990 Supervision of high risk pregnancy, unspecified, unspecified trimester: Secondary | ICD-10-CM

## 2019-05-31 DIAGNOSIS — O99212 Obesity complicating pregnancy, second trimester: Secondary | ICD-10-CM

## 2019-05-31 DIAGNOSIS — Z23 Encounter for immunization: Secondary | ICD-10-CM

## 2019-05-31 NOTE — Progress Notes (Signed)
   PRENATAL VISIT NOTE  Subjective:  Monica Wolfe is a 33 y.o. [id number] at [redacted]w[redacted]d being seen today for ongoing prenatal care.  She is currently monitored for the following issues for this high-risk pregnancy and has Bilateral knee pain; Health care maintenance; Supervision of high risk pregnancy, antepartum; Chronic hypertension; Breast lesion; H/O postpartum hemorrhage, currently pregnant; and Language barrier on their problem list.  Patient reports no complaints.  Contractions: Not present. Vag. Bleeding: None.  Movement: Absent. Denies leaking of fluid.   The following portions of the patient's history were reviewed and updated as appropriate: allergies, current medications, past family history, past medical history, past social history, past surgical history and problem list.   Objective:   Vitals:   05/31/19 0817  BP: 123/86  Pulse: 76    Fetal Status: Fetal Heart Rate (bpm): 152   Movement: Absent     General:  Alert, oriented and cooperative. Patient is in no acute distress.  Skin: Skin is warm and dry. No rash noted.   Cardiovascular: Normal heart rate noted  Respiratory: Normal respiratory effort, no problems with respiration noted  Abdomen: Soft, gravid, appropriate for gestational age.  Pain/Pressure: Present     Pelvic: Cervical exam deferred        Extremities: Normal range of motion.  Edema: None  Mental Status: Normal mood and affect. Normal behavior. Normal judgment and thought content.   Assessment and Plan:  Pregnancy: [id number] at [redacted]w[redacted]d 1. Supervision of high risk pregnancy, antepartum - add AFP to complete NIPS  2. Chronic hypertension - baby asa  3. Language barrier - Spanish interpretor present  4. Diet controlled gestational diabetes mellitus (GDM) in first trimester - sugars most all in range. I told her she only needs to check 2 times per day at the present  5. Obesity in pregnancy   Preterm labor symptoms and general obstetric  precautions including but not limited to vaginal bleeding, contractions, leaking of fluid and fetal movement were reviewed in detail with the patient. Please refer to After Visit Summary for other counseling recommendations.   Return in about 4 weeks (around 06/28/2019) for virtual.  Future Appointments  Date Time Provider [place]  06/28/2019 11:15 AM [facility]    Yosiah Jasmin Marijo Sanes, MD

## 2019-06-01 ENCOUNTER — Other Ambulatory Visit (HOSPITAL_COMMUNITY): Payer: Self-pay | Admitting: *Deleted

## 2019-06-01 ENCOUNTER — Ambulatory Visit (HOSPITAL_COMMUNITY)
Admission: RE | Admit: 2019-06-01 | Discharge: 2019-06-01 | Disposition: A | Discharge status: Home / Self Care | Payer: Self-pay | Source: Ambulatory Visit | Attending: Obstetrics and Gynecology | Admitting: Obstetrics and Gynecology

## 2019-06-01 ENCOUNTER — Encounter: Payer: Self-pay | Admitting: General Practice

## 2019-06-01 ENCOUNTER — Ambulatory Visit (HOSPITAL_COMMUNITY): Payer: Self-pay | Admitting: *Deleted

## 2019-06-01 ENCOUNTER — Encounter (HOSPITAL_COMMUNITY): Payer: Self-pay

## 2019-06-01 DIAGNOSIS — O99212 Obesity complicating pregnancy, second trimester: Secondary | ICD-10-CM

## 2019-06-01 DIAGNOSIS — O09292 Supervision of pregnancy with other poor reproductive or obstetric history, second trimester: Secondary | ICD-10-CM

## 2019-06-01 DIAGNOSIS — O10012 Pre-existing essential hypertension complicating pregnancy, second trimester: Secondary | ICD-10-CM

## 2019-06-01 DIAGNOSIS — Z3A15 15 weeks gestation of pregnancy: Secondary | ICD-10-CM

## 2019-06-01 DIAGNOSIS — O0990 Supervision of high risk pregnancy, unspecified, unspecified trimester: Secondary | ICD-10-CM

## 2019-06-02 LAB — AFP, SERUM, OPEN SPINA BIFIDA
AFP MoM: 0.61
AFP Value: 24.6 ng/mL
Gest. Age on Collection Date: 19.1 weeks
Maternal Age At EDD: 31.7 yr
OSBR Risk 1 IN: 10000
Test Results:: NEGATIVE
Weight: 217 [lb_av]

## 2019-06-08 ENCOUNTER — Ambulatory Visit (INDEPENDENT_AMBULATORY_CARE_PROVIDER_SITE_OTHER): Payer: Self-pay | Admitting: General Practice

## 2019-06-08 ENCOUNTER — Other Ambulatory Visit: Payer: Self-pay

## 2019-06-08 VITALS — BP 124/86 | HR 82

## 2019-06-08 DIAGNOSIS — R339 Retention of urine, unspecified: Secondary | ICD-10-CM

## 2019-06-08 DIAGNOSIS — R300 Dysuria: Secondary | ICD-10-CM

## 2019-06-08 DIAGNOSIS — O26892 Other specified pregnancy related conditions, second trimester: Secondary | ICD-10-CM

## 2019-06-08 MED ORDER — FLAVOXATE HCL 100 MG PO TABS: 100.0000 mg | ORAL_TABLET | Freq: Three times a day (TID) | ORAL | 0 refills | Status: DC | PRN

## 2019-06-08 NOTE — Progress Notes (Signed)
Patient presents to office today reporting dysuria & incomplete emptying of her bladder for the past 4 days. Also reports HA's off and on x 2 weeks- tylenol helps & she denies dizziness or blurry vision. UA negative.  Reviewed with Dr Hulan Fray who states patient should increase hydration and may take urispas TID disp #10. Discussed with patient negative UA but she could have an early infection developing, advised she drink at least 2 liters a day to flush kidneys/bladder and informed her of Rx for the pain. Patient verbalized understanding & had no questions. Eda used for interpreter.  Koren Bound RN BSN 06/08/19

## 2019-06-10 NOTE — L&D Delivery Note (Addendum)
OB/GYN Faculty Practice Delivery Note  Monica Wolfe is a 34 y.o. [id number] s/p VD at [redacted]w[redacted]d. She was admitted for PPROM.   ROM: 299h 47m with bloody fluid GBS Status: negative   Maximum Maternal Temperature: 98.5 F   Labor Progress: . Patient presented to MAU for PROM at [redacted]w[redacted]d. She was admitted for PPROM and treated in usual fashion.  Patient was transferred to L&D today for PTL, given a rescue dose of betamethasone and started on magnesium for neuroprotection. Initial SVE: 4/ 70/ -2. She then progressed to complete spontaneously.   Delivery Date/Time: 09/19/19  1146 Delivery: Called to room and patient was complete and pushing. Head delivered MOA. Nuchal cord x1 was reduced. Shoulder and body delivered in usual fashion. Infant with spontaneous cry, placed on mother's abdomen, dried and stimulated. Cord clamped x 2 after 1-minute delay, and cut by FOB. Cord blood drawn. Placenta delivered spontaneously with gentle cord traction. Fundus firm with massage and Pitocin. Labia, perineum, vagina, and cervix inspected inspected with no lacerations.   Baby Weight: 1910g  Placenta: Sent to pathology Complications: Nuchal cord x1,  Lacerations: None EBL: 356 mL  Analgesia: Epidural  Infant: APGAR (1 MIN): 6   APGAR (5 MINS): 7    Katha Cabal, DO PGY-1, [facility] 09/19/2019 2:23 PM      OB FELLOW ATTESTATION  I was present, gloved, and supervising throughout delivery and have edited the above note to reflect any changes or updates.  Zack Seal, MD/[facility] OB Fellow  09/19/2019, 4:20 PM

## 2019-06-11 ENCOUNTER — Inpatient Hospital Stay (HOSPITAL_COMMUNITY)
Admission: AD | Admit: 2019-06-11 | Discharge: 2019-06-11 | Disposition: A | Discharge status: Home / Self Care | Payer: Self-pay | Attending: Obstetrics & Gynecology | Admitting: Obstetrics & Gynecology

## 2019-06-11 ENCOUNTER — Encounter (HOSPITAL_COMMUNITY): Payer: Self-pay | Admitting: Obstetrics & Gynecology

## 2019-06-11 ENCOUNTER — Other Ambulatory Visit: Payer: Self-pay

## 2019-06-11 DIAGNOSIS — Z3A16 16 weeks gestation of pregnancy: Secondary | ICD-10-CM | POA: Insufficient documentation

## 2019-06-11 DIAGNOSIS — R300 Dysuria: Secondary | ICD-10-CM | POA: Insufficient documentation

## 2019-06-11 DIAGNOSIS — O99891 Other specified diseases and conditions complicating pregnancy: Secondary | ICD-10-CM | POA: Insufficient documentation

## 2019-06-11 DIAGNOSIS — O0990 Supervision of high risk pregnancy, unspecified, unspecified trimester: Secondary | ICD-10-CM

## 2019-06-11 DIAGNOSIS — R109 Unspecified abdominal pain: Secondary | ICD-10-CM | POA: Insufficient documentation

## 2019-06-11 DIAGNOSIS — M549 Dorsalgia, unspecified: Secondary | ICD-10-CM | POA: Insufficient documentation

## 2019-06-11 DIAGNOSIS — O26892 Other specified pregnancy related conditions, second trimester: Secondary | ICD-10-CM | POA: Insufficient documentation

## 2019-06-11 DIAGNOSIS — Z87442 Personal history of urinary calculi: Secondary | ICD-10-CM | POA: Insufficient documentation

## 2019-06-11 DIAGNOSIS — Z87440 Personal history of urinary (tract) infections: Secondary | ICD-10-CM | POA: Insufficient documentation

## 2019-06-11 DIAGNOSIS — Z7982 Long term (current) use of aspirin: Secondary | ICD-10-CM | POA: Insufficient documentation

## 2019-06-11 LAB — URINALYSIS, ROUTINE W REFLEX MICROSCOPIC
Bilirubin Urine: NEGATIVE
Glucose, UA: NEGATIVE mg/dL
Hgb urine dipstick: NEGATIVE
Ketones, ur: NEGATIVE mg/dL
Leukocytes,Ua: NEGATIVE
Nitrite: POSITIVE — AB
Protein, ur: 100 mg/dL — AB
Specific Gravity, Urine: 1.023 (ref 1.005–1.030)
pH: 5 (ref 5.0–8.0)

## 2019-06-11 LAB — WET PREP, GENITAL
Clue Cells Wet Prep HPF POC: NONE SEEN
Sperm: NONE SEEN
Trich, Wet Prep: NONE SEEN
Yeast Wet Prep HPF POC: NONE SEEN

## 2019-06-11 MED ORDER — NITROFURANTOIN MONOHYD MACRO 100 MG PO CAPS: 100.0000 mg | ORAL_CAPSULE | Freq: Two times a day (BID) | ORAL | 0 refills | Status: DC

## 2019-06-11 MED ORDER — PHENAZOPYRIDINE HCL 200 MG PO TABS: 200.0000 mg | ORAL_TABLET | Freq: Three times a day (TID) | ORAL | 0 refills | Status: DC

## 2019-06-11 MED ORDER — CYCLOBENZAPRINE HCL 10 MG PO TABS
10.0000 mg | ORAL_TABLET | Freq: Once | ORAL | Status: AC
  Administered 2019-06-11: 10 mg via ORAL
  Filled 2019-06-11: qty 1

## 2019-06-11 MED ORDER — PHENAZOPYRIDINE HCL 100 MG PO TABS
200.0000 mg | ORAL_TABLET | Freq: Once | ORAL | Status: AC
  Administered 2019-06-11: 200 mg via ORAL
  Filled 2019-06-11: qty 2

## 2019-06-11 MED ORDER — NITROFURANTOIN MONOHYD MACRO 100 MG PO CAPS: 100.0000 mg | ORAL_CAPSULE | Freq: Once | ORAL | Status: DC

## 2019-06-11 NOTE — MAU Note (Signed)
Monica Wolfe is a 34 y.o. at [redacted]w[redacted]d here in MAU reporting: has been having burning with urination for the past week. Only is able to urinate a small amount. Having some urinary urgency. Having some abdominal and back pain since this AM. No bleeding or discharge.  Onset of complaint: a week  Pain score: abdomen 5/10, back 7/10  Vitals:   06/11/19 0906 06/11/19 0945  BP: (!) 131/93 129/89  Pulse: 76 80  Resp: 14 16  Temp: 98 F (36.7 C) 98.2 F (36.8 C)  SpO2: 99% 100%     Lab orders placed from triage: UA

## 2019-06-11 NOTE — MAU Provider Note (Signed)
History     CSN: [id number]  Arrival date and time: 06/11/19 1884   First Provider Initiated Contact with Patient 06/11/19 1020      Chief Complaint  Patient presents with  . Dysuria  . Urinary Frequency  . Abdominal Pain  . Back Pain   HPI  Ms.  Monica Wolfe is a 34 y.o. year old [id number] female at [redacted]w[redacted]d weeks gestation who presents to MAU reporting burning with urination, urinating a small amount, urgency, abdominal pain, and back pain for about 1 week. She was seen at [facility] on 06/[facility]/19 and Rx'd Urispas., She reports it has not helped. She has also been taking Fenazaopiridina 100 mg TID that her mom sends her from [place]. She has a h/o frequent UTIs since having kidney stones surgically removed in 2015. She reports the abdominal and back pain started this morning. She rates the abdominal pain a 5/10 and the back pain a 7/10. She denies VB or abnormal discharge. She denies any vomiting.  Past Medical History:  Diagnosis Date  . Chronic hypertension 05/10/2019  . Diabetes mellitus without complication [facility])     Past Surgical History:  Procedure Laterality Date  . LITHOTRIPSY  10/17/2013    Family History  Problem Relation Age of Onset  . Diabetes Maternal Grandmother     Social History   Tobacco Use  . Smoking status: Never Smoker  . Smokeless tobacco: Never Used  Substance Use Topics  . Alcohol use: No  . Drug use: No    Allergies: No Known Allergies  Facility-Administered Medications Prior to Admission  Medication Dose Route Frequency Provider Last Rate Last Admin  . aspirin chewable tablet 81 mg  81 mg Oral Daily Conan Bowens, MD       Medications Prior to Admission  Medication Sig Dispense Refill Last Dose  . acetaminophen (TYLENOL) 325 MG tablet Take 650 mg by mouth every 6 (six) hours as needed.     Marland Kitchen aspirin 81 MG EC tablet Take 81 mg by mouth daily. Swallow whole.     . flavoxATE (URISPAS) 100 MG tablet Take 1 tablet (100 mg total) by  mouth 3 (three) times daily as needed for bladder spasms. 10 tablet 0   . Prenatal Vit-Fe Fumarate-FA (PRENATAL MULTIVITAMIN) TABS tablet Take 1 tablet by mouth daily at 12 noon.       Review of Systems  Constitutional: Negative.   HENT: Negative.   Eyes: Negative.   Respiratory: Negative.   Cardiovascular: Negative.   Gastrointestinal: Negative.   Endocrine: Negative.   Genitourinary: Positive for decreased urine volume, dysuria, frequency, pelvic pain and urgency. Negative for vaginal bleeding and vaginal discharge.  Musculoskeletal: Positive for back pain.  Skin: Negative.   Allergic/Immunologic: Negative.   Neurological: Negative.   Hematological: Negative.   Psychiatric/Behavioral: Negative.    Physical Exam   Blood pressure 129/89, pulse 80, temperature 98.2 F (36.8 C), temperature source Oral, resp. rate 16, last menstrual period 01/17/2019, SpO2 100 %.  Physical Exam  Constitutional: She is oriented to person, place, and time. She appears well-developed and well-nourished.  HENT:  Head: Normocephalic and atraumatic.  Eyes: Pupils are equal, round, and reactive to light.  Cardiovascular: Normal rate.  Respiratory: Effort normal.  GI: Soft. There is no CVA tenderness.  Genitourinary:    Genitourinary Comments: Dilation: Closed Effacement (%): Thick Exam by: Arita Miss cnm    Musculoskeletal:        General: Normal range of motion.  Cervical back: Normal range of motion.  Neurological: She is alert and oriented to person, place, and time.  Skin: Skin is warm and dry.  Psychiatric: She has a normal mood and affect. Her behavior is normal. Judgment and thought content normal.   FHTs by doppler: 150 bpm  MAU Course  Procedures  MDM CCUA UCx -- results pending Wet Prep GC/CT -- results pending Flexeril 10 mg -- resolved pain Pyridium 200 mg now  Results for orders placed or performed during the hospital encounter of 06/11/19 (from the past 24 hour(s))   Urinalysis, Routine w reflex microscopic     Status: Abnormal   Collection Time: 06/11/19  9:54 AM  Result Value Ref Range   Color, Urine AMBER (A) YELLOW   APPearance HAZY (A) CLEAR   Specific Gravity, Urine 1.023 1.005 - 1.030   pH 5.0 5.0 - 8.0   Glucose, UA NEGATIVE NEGATIVE mg/dL   Hgb urine dipstick NEGATIVE NEGATIVE   Bilirubin Urine NEGATIVE NEGATIVE   Ketones, ur NEGATIVE NEGATIVE mg/dL   Protein, ur 100 (A) NEGATIVE mg/dL   Nitrite POSITIVE (A) NEGATIVE   Leukocytes,Ua NEGATIVE NEGATIVE   RBC / HPF 0-5 0 - 5 RBC/hpf   WBC, UA 11-20 0 - 5 WBC/hpf   Bacteria, UA RARE (A) NONE SEEN   Squamous Epithelial / LPF 11-20 0 - 5   Mucus PRESENT   Wet Prep     Status: Abnormal   Collection Time: 06/11/19 10:33 AM  Result Value Ref Range   Yeast Wet Prep HPF POC NONE SEEN NONE SEEN   Trich, Wet Prep NONE SEEN NONE SEEN   Clue Cells Wet Prep HPF POC NONE SEEN NONE SEEN   WBC, Wet Prep HPF POC MANY (A) NONE SEEN   Sperm NONE SEEN     Assessment and Plan  Dysuria during pregnancy in second trimester  - Rx for Pyridium 200 mg daily - Information provided on dysuria - Explained that she does not need to take the mediation she was sent from [place], because it is the same medication that we have Rx'd today   Back pain affecting pregnancy in second trimester  - Advised to take Tylenol 1000 mg  Abdominal pain during pregnancy in second trimester   - Discharge patient - Keep scheduled appts with [facility] - Patient verbalized an understanding of the plan of care and agrees.     Laury Deep, MSN, CNM 06/11/2019, 10:20 AM

## 2019-06-12 LAB — CULTURE, OB URINE: Special Requests: NORMAL

## 2019-06-13 ENCOUNTER — Encounter: Payer: Self-pay | Admitting: Obstetrics & Gynecology

## 2019-06-13 LAB — GC/CHLAMYDIA PROBE AMP (~~LOC~~) NOT AT ARMC
Chlamydia: NEGATIVE
Comment: NEGATIVE
Comment: NORMAL
Neisseria Gonorrhea: NEGATIVE

## 2019-06-17 ENCOUNTER — Emergency Department (HOSPITAL_COMMUNITY)
Admission: EM | Admit: 2019-06-17 | Discharge: 2019-06-17 | Disposition: A | Discharge status: Home / Self Care | Payer: Self-pay | Attending: Emergency Medicine | Admitting: Emergency Medicine

## 2019-06-17 ENCOUNTER — Other Ambulatory Visit: Payer: Self-pay

## 2019-06-17 ENCOUNTER — Encounter (HOSPITAL_COMMUNITY): Payer: Self-pay | Admitting: *Deleted

## 2019-06-17 DIAGNOSIS — Z79899 Other long term (current) drug therapy: Secondary | ICD-10-CM | POA: Insufficient documentation

## 2019-06-17 DIAGNOSIS — Z3A17 17 weeks gestation of pregnancy: Secondary | ICD-10-CM | POA: Insufficient documentation

## 2019-06-17 DIAGNOSIS — I10 Essential (primary) hypertension: Secondary | ICD-10-CM | POA: Insufficient documentation

## 2019-06-17 DIAGNOSIS — E119 Type 2 diabetes mellitus without complications: Secondary | ICD-10-CM | POA: Insufficient documentation

## 2019-06-17 DIAGNOSIS — Z7982 Long term (current) use of aspirin: Secondary | ICD-10-CM | POA: Insufficient documentation

## 2019-06-17 DIAGNOSIS — O98511 Other viral diseases complicating pregnancy, first trimester: Secondary | ICD-10-CM | POA: Insufficient documentation

## 2019-06-17 DIAGNOSIS — R0989 Other specified symptoms and signs involving the circulatory and respiratory systems: Secondary | ICD-10-CM | POA: Insufficient documentation

## 2019-06-17 DIAGNOSIS — O26899 Other specified pregnancy related conditions, unspecified trimester: Secondary | ICD-10-CM | POA: Insufficient documentation

## 2019-06-17 DIAGNOSIS — M7918 Myalgia, other site: Secondary | ICD-10-CM | POA: Insufficient documentation

## 2019-06-17 DIAGNOSIS — U071 COVID-19: Secondary | ICD-10-CM | POA: Insufficient documentation

## 2019-06-17 LAB — POC SARS CORONAVIRUS 2 AG -  ED: SARS Coronavirus 2 Ag: POSITIVE — AB

## 2019-06-17 MED ORDER — ACETAMINOPHEN 325 MG PO TABS
650.0000 mg | ORAL_TABLET | Freq: Once | ORAL | Status: AC
  Administered 2019-06-17: 650 mg via ORAL
  Filled 2019-06-17: qty 2

## 2019-06-17 NOTE — Progress Notes (Signed)
I have reviewed this chart and agree with the RN/CMA assessment and management.    Shariyah Eland C Mick Tanguma, MD, FACOG Attending Physician, Faculty Practice [facility]  

## 2019-06-17 NOTE — ED Notes (Signed)
Patient verbalizes understanding of discharge instructions. Opportunity for questioning and answers were provided. Armband removed by staff, pt discharged from ED ambulatory.   

## 2019-06-17 NOTE — ED Provider Notes (Signed)
[place] EMERGENCY DEPARTMENT Provider Note   CSN: [id number] Arrival date & time: 06/17/19  1954     History Chief Complaint  Patient presents with  . Generalized Body Aches    Monica Wolfe is a 34 y.o. female with h/o HTN, diet controlled diabetes, currently [redacted]w[redacted]d pregnant presents to ER for evaluation of runny nose, chills and body aches onset this morning.  She checked it and then had a fever in the ER but states this is new.  She works in a [employer] and denies any sick contacts there.  No sick contacts at home.  Denies other symptoms including headache, sore throat, cough, chest pain, shortness of breath, vomiting, diarrhea, abdominal pain, loss of taste or smell.  Was diagnosed with a UTI by OB/GYN recently but states she finished antibiotics and has no other urinary tract infection symptoms.  No lower abdominal or pelvic pain or cramping.  No leaking of fluid, abnormal vaginal bleeding or discharge.  She is given Tylenol in triage.  No other interventions.  HPI     Past Medical History:  Diagnosis Date  . Chronic hypertension 05/10/2019  . Diabetes mellitus without complication [facility])     Patient Active Problem List   Diagnosis Date Noted  . Supervision of high risk pregnancy, antepartum 05/10/2019  . Chronic hypertension 05/10/2019  . Breast lesion 05/10/2019  . H/O postpartum hemorrhage, currently pregnant 05/10/2019  . Language barrier 05/10/2019  . Bilateral knee pain 07/19/2015  . Health care maintenance 07/19/2015    Past Surgical History:  Procedure Laterality Date  . LITHOTRIPSY  10/17/2013     OB History    Gravida  7   Para  4   Term  4   Preterm      AB  2   Living  4     SAB  2   TAB      Ectopic      Multiple      Live Births  4           Family History  Problem Relation Age of Onset  . Diabetes Maternal Grandmother     Social History   Tobacco Use  . Smoking status: Never  Smoker  . Smokeless tobacco: Never Used  Substance Use Topics  . Alcohol use: No  . Drug use: No    Home Medications Prior to Admission medications   Medication Sig Start Date End Date Taking? Authorizing Provider  aspirin 81 MG EC tablet Take 81 mg by mouth daily. Swallow whole.   Yes [provider]  flavoxATE (URISPAS) 100 MG tablet Take 1 tablet (100 mg total) by mouth 3 (three) times daily as needed for bladder spasms. 06/08/19  Yes Dove, Myra C, MD  phenazopyridine (PYRIDIUM) 200 MG tablet Take 1 tablet (200 mg total) by mouth 3 (three) times daily. 06/11/19  Yes Laury Deep, CNM  Prenatal Vit-Fe Fumarate-FA (PRENATAL MULTIVITAMIN) TABS tablet Take 1 tablet by mouth daily at 12 noon.   Yes [provider]  nitrofurantoin, macrocrystal-monohydrate, (MACROBID) 100 MG capsule Take 1 capsule (100 mg total) by mouth 2 (two) times daily. 06/11/19   Laury Deep, CNM    Allergies    Patient has no known allergies.  Review of Systems   Review of Systems  Constitutional: Positive for chills and fever.  HENT: Positive for congestion and rhinorrhea.   All other systems reviewed and are negative.   Physical Exam Updated Vital Signs BP Marland Kitchen)  144/83   Pulse 96   Temp 99.3 F (37.4 C) (Oral)   Resp 17   Ht 5\' 6"  (1.676 m)   Wt 97.5 kg   LMP 01/17/2019   SpO2 97%   BMI 34.70 kg/m   Physical Exam Vitals and nursing note reviewed.  Constitutional:      Appearance: She is well-developed.     Comments: Non toxic in NAD  HENT:     Head: Normocephalic and atraumatic.     Nose: Congestion and rhinorrhea present.     Comments: Nares erythematous with rhinorrhea. Intranasal mucosa erythematous, edematous.  Septum midline.     Mouth/Throat:     Comments: MMM. Normal oropharynx/tonsils  Eyes:     Conjunctiva/sclera: Conjunctivae normal.  Cardiovascular:     Rate and Rhythm: Normal rate and regular rhythm.  Pulmonary:     Effort: Pulmonary effort is normal.      Breath sounds: Normal breath sounds.  Abdominal:     General: Bowel sounds are normal.     Palpations: Abdomen is soft.     Tenderness: There is no abdominal tenderness.     Comments: Gravid abdomen, non tender. Soft.  No suprapubic or CVA tenderness.   Musculoskeletal:        General: Normal range of motion.     Cervical back: Normal range of motion.  Skin:    General: Skin is warm and dry.     Capillary Refill: Capillary refill takes less than 2 seconds.  Neurological:     Mental Status: She is alert.  Psychiatric:        Behavior: Behavior normal.     ED Results / Procedures / Treatments   Labs (all labs ordered are listed, but only abnormal results are displayed) Labs Reviewed  POC SARS CORONAVIRUS 2 AG -  ED - Abnormal; Notable for the following components:      Result Value   SARS Coronavirus 2 Ag POSITIVE (*)    All other components within normal limits    EKG None  Radiology No results found.  Procedures Ultrasound ED OB Pelvic  Date/Time: 06/17/2019 11:23 PM Performed by: 08/15/2019, PA-C Authorized by: Liberty Handy, PA-C   Procedure details:    Indications: pregnant with no fetal heart sounds     Indications comment:  Fetal heart rate doppler malfunction, unable to assess FHR so Liberty Handy was used   Assess:  EGA (fetal HR)   Technique:  Transabdominal obstetric (HCG+) exam (known pregnancy status)   Images: archived   Study Limitations: body habitus Uterine findings:    Intrauterine pregnancy: identified     Single gestation: identified     Fetal heart rate: identified (unable to capture on M mode on M for unknown reason this option was not coming up with M mode, freeze, calculate. )     Estimated gestational age: 39 weeks Left ovary findings:    Left ovary:  Not visualized    Right ovary findings:     Right ovary:  Not visualized    Other findings:    Free pelvic fluid: not identified     Free peritoneal fluid: not identified   Comments:      FHR machine malfunction and unable to be used for screening purposes Bedside 12 performed to calculate FHR FHR calculation not coming up during bedside US despite many attempts, counted heart beats for 60 seconds to calculate FHR 140.  Fetus with active movement during [place]   (including  critical care time)  Medications Ordered in ED Medications  acetaminophen (TYLENOL) tablet 650 mg (650 mg Oral Given 06/17/19 2035)    ED Course  I have reviewed the triage vital signs and the nursing notes.  Pertinent labs & imaging results that were available during my care of the patient were reviewed by me and considered in my medical decision making (see chart for details).    MDM Rules/Calculators/A&P                      I have reviewed patient's EMR to obtain pertinent PMH to assist in MDM.    Discrepancy on documented EGA from previous OB notes. Per last OB note pt was 19w1 day on 05/31/2019.  Patient states she originally went to another [facility] practice and her due date was changed now January 17th 2021. She is sure she is [redacted]w[redacted]d today.    Symptoms and exam most suggestive of uncomplicated viral illness.   DDX incluldes viral URI including COVID-19 vs influenza vs other.  Considered strep, bacterial bronchitis or pneumonia unlikely given overall clinical picture. No travel. No known exposures to confirmed COVID-19.    Normal WOB. Normal VS.  No tachypnea, hypoxemia. Ambulatory without hypoxia.  Lungs are CTAB.   POC COVID-19 positive today which fits clinical picture.   I do not think that a CXR or emergent work up is indicated at this time given overall clinical well being, normal V.  No signs increased WOB or other concerning features to exam. No clinical signs of severe illness, dehydration to warrant labs or IVF. Her UTI symptoms have resolved after macrobid.  Discussed COVID result with patient. Given reassuring clinical findings/work up, will discharge with symptomatic treatment. We  discussed patient's theoretical overall risk profile. She is pregnant with history of diet controlled diabetes, obesity, HTN  however no signs of severe illness today.  Discussed pregnancy safe OTC medicines for viral illness. Recommended OB f/u in the next 3 days for re-check to ensure to clinical decline, further guidance as needed. Self-isolation instructions at home until pending test discussed. Pt was given home self-isolation instructions and instructions for family members. Educated on signs and symptoms that would warrant return to ED.  Pt comfortable and agreeable with POC.     Final Clinical Impression(s) / ED Diagnoses Final diagnoses:  COVID-19 affecting pregnancy in first trimester    Rx / DC Orders ED Discharge Orders    None       Liberty Handy, PA-C 06/17/19 2331    Rolan Bucco, MD 06/17/19 2333

## 2019-06-17 NOTE — ED Triage Notes (Addendum)
Pt reports body aches, chills and runny nose since this morning. She is febrile in triage, unaware of the same. Denies cough/sob or covid exposure. She is [redacted] weeks pregnant, no pregnancy related complaints

## 2019-06-17 NOTE — Discharge Instructions (Addendum)
You were seen in the ED for fever, runny nose, chills, body aches.  YOU TESTED POSITIVE FOR COVID-19 INFECTION.  Treatment of your illness and symptoms for now will include self-isolation, monitoring of symptoms and supportive care with over-the-counter medicines.    Stay well-hydrated. Rest. You can use over the counter medications to help with symptoms: [phone] mg acetaminophen (tylenol) every 6 hours, around the clock to help with associated fevers, sore throat, headaches, generalized body aches and malaise.  Over-the-counter nasal steroid spray such as fluticasone (flonase) Allergy medication (loratadine, cetirizine, etc) to help with nasal congestion, runny nose and postnasal drip.   Wash your hands often to prevent spread.  Stay hydrated with plenty of clear fluids Rest   Return to the ED if symptoms are worsening or severe, there is increased work of breathing, chest pain or shortness of breath with exertion or activity, inability to tolerate fluids due to persistent vomiting despite nausea medicines, passing out, light headedness.  If your test results are POSITIVE, the following isolation requirements need to be met to return to work and resume essential activities: At least 10 days since symptom onset  72 hours of absence of fever without antifever medicine (ibuprofen, acetaminophen). A fever is temperature of 100.20F or greater. Improvement of respiratory symptoms  If your test is NEGATIVE, the following requirements need to be met to return to work and essential activities: 1. Your symptoms have improved  2.  You do not have a fever for a total of 3 days without antifever medicines (ibuprofen, acetaminophen).   3. You do not develop any other symptoms of COVID-19.  Call your job and notify them that your test result was negative to see if they will all you to return to work. Sometimes when a COVID test is done very early on in the illness, it can be false negative.  This means  the test did not pick up the virus because it was too early.  If your symptoms are not improving or you develop new symptoms of COVID-19, you may need to be retested.      Infection Prevention Recommendations for Individuals Confirmed to have, or Being Evaluated for, or have symptoms of 2019 Novel Coronavirus (COVID-19) Infection Who Receive Care at Home  Individuals who are confirmed to have, or are being evaluated for, COVID-19 should follow the prevention steps below until a healthcare provider or local or state health department says they can return to normal activities.  Stay home except to get medical care You should restrict activities outside your home, except for getting medical care. Do not go to work, school, or public areas, and do not use public transportation or taxis.  Call ahead before visiting your doctor Before your medical appointment, call the healthcare provider and tell them that you have, or are being evaluated for, COVID-19 infection. This will help the healthcare provider's office take steps to keep other people from getting infected. Ask your healthcare provider to call the local or state health department.  Monitor your symptoms Seek prompt medical attention if your illness is worsening (e.g., difficulty breathing). Before going to your medical appointment, call the healthcare provider and tell them that you have, or are being evaluated for, COVID-19 infection. Ask your healthcare provider to call the local or state health department.  Wear a facemask You should wear a facemask that covers your nose and mouth when you are in the same room with other people and when you visit a healthcare provider. People  who live with or visit you should also wear a facemask while they are in the same room with you.  Separate yourself from other people in your home As much as possible, you should stay in a different room from other people in your home. Also, you should use a  separate bathroom, if available.  Avoid sharing household items You should not share dishes, drinking glasses, cups, eating utensils, towels, bedding, or other items with other people in your home. After using these items, you should wash them thoroughly with soap and water.  Cover your coughs and sneezes Cover your mouth and nose with a tissue when you cough or sneeze, or you can cough or sneeze into your sleeve. Throw used tissues in a lined trash can, and immediately wash your hands with soap and water for at least 20 seconds or use an alcohol-based hand rub.  Wash your [facility] your hands often and thoroughly with soap and water for at least 20 seconds. You can use an alcohol-based hand sanitizer if soap and water are not available and if your hands are not visibly dirty. Avoid touching your eyes, nose, and mouth with unwashed hands.   Prevention Steps for Caregivers and Household Members of Individuals Confirmed to have, or Being Evaluated for, or have symptoms of 2019 Novel Coronavirus (COVID-19) Infection Being Cared for in the Home  If you live with, or provide care at home for, a person confirmed to have, or being evaluated for, COVID-19 infection please follow these guidelines to prevent infection:  Follow healthcare provider's instructions Make sure that you understand and can help the patient follow any healthcare provider instructions for all care.  Provide for the patient's basic needs You should help the patient with basic needs in the home and provide support for getting groceries, prescriptions, and other personal needs.  Monitor the patient's symptoms If they are getting sicker, call his or her medical provider and tell them that the patient has, or is being evaluated for, COVID-19 infection. This will help the healthcare provider's office take steps to keep other people from getting infected. Ask the healthcare provider to call the local or state health  department.  Limit the number of people who have contact with the patient If possible, have only one caregiver for the patient. Other household members should stay in another home or place of residence. If this is not possible, they should stay in another room, or be separated from the patient as much as possible. Use a separate bathroom, if available. Restrict visitors who do not have an essential need to be in the home.  Keep older adults, very young children, and other sick people away from the patient Keep older adults, very young children, and those who have compromised immune systems or chronic health conditions away from the patient. This includes people with chronic heart, lung, or kidney conditions, diabetes, and cancer.  Ensure good ventilation Make sure that shared spaces in the home have good air flow, such as from an air conditioner or an opened window, weather permitting.  Wash your hands often Wash your hands often and thoroughly with soap and water for at least 20 seconds. You can use an alcohol based hand sanitizer if soap and water are not available and if your hands are not visibly dirty. Avoid touching your eyes, nose, and mouth with unwashed hands. Use disposable paper towels to dry your hands. If not available, use dedicated cloth towels and replace them when they become  wet.  Wear a facemask and gloves Wear a disposable facemask at all times in the room and gloves when you touch or have contact with the patient's blood, body fluids, and/or secretions or excretions, such as sweat, saliva, sputum, nasal mucus, vomit, urine, or feces.  Ensure the mask fits over your nose and mouth tightly, and do not touch it during use. Throw out disposable facemasks and gloves after using them. Do not reuse. Wash your hands immediately after removing your facemask and gloves. If your personal clothing becomes contaminated, carefully remove clothing and launder. Wash your hands after  handling contaminated clothing. Place all used disposable facemasks, gloves, and other waste in a lined container before disposing them with other household waste. Remove gloves and wash your hands immediately after handling these items.  Do not share dishes, glasses, or other household items with the patient Avoid sharing household items. You should not share dishes, drinking glasses, cups, eating utensils, towels, bedding, or other items with a patient who is confirmed to have, or being evaluated for, COVID-19 infection. After the person uses these items, you should wash them thoroughly with soap and water.  Wash laundry thoroughly Immediately remove and wash clothes or bedding that have blood, body fluids, and/or secretions or excretions, such as sweat, saliva, sputum, nasal mucus, vomit, urine, or feces, on them. Wear gloves when handling laundry from the patient. Read and follow directions on labels of laundry or clothing items and detergent. In general, wash and dry with the warmest temperatures recommended on the label.  Clean all areas the individual has used often Clean all touchable surfaces, such as counters, tabletops, doorknobs, bathroom fixtures, toilets, phones, keyboards, tablets, and bedside tables, every day. Also, clean any surfaces that may have blood, body fluids, and/or secretions or excretions on them. Wear gloves when cleaning surfaces the patient has come in contact with. Use a diluted bleach solution (e.g., dilute bleach with 1 part bleach and 10 parts water) or a household disinfectant with a label that says EPA-registered for coronaviruses. To make a bleach solution at home, add 1 tablespoon of bleach to 1 quart (4 cups) of water. For a larger supply, add  cup of bleach to 1 gallon (16 cups) of water. Read labels of cleaning products and follow recommendations provided on product labels. Labels contain instructions for safe and effective use of the cleaning product  including precautions you should take when applying the product, such as wearing gloves or eye protection and making sure you have good ventilation during use of the product. Remove gloves and wash hands immediately after cleaning.  Monitor yourself for signs and symptoms of illness Caregivers and household members are considered close contacts, should monitor their health, and will be asked to limit movement outside of the home to the extent possible. Follow the monitoring steps for close contacts listed on the symptom monitoring form.  ? If you have additional questions, contact your local health department or call the epidemiologist on call at [phone] (available 24/7). ? This guidance is subject to change. For the most up-to-date guidance from [facility], please refer to their website: [email or web address]

## 2019-06-18 ENCOUNTER — Encounter: Payer: Self-pay | Admitting: Medical

## 2019-06-18 DIAGNOSIS — U071 COVID-19: Secondary | ICD-10-CM

## 2019-06-18 HISTORY — DX: COVID-19: U07.1

## 2019-06-20 ENCOUNTER — Telehealth (INDEPENDENT_AMBULATORY_CARE_PROVIDER_SITE_OTHER): Payer: Self-pay | Admitting: Obstetrics and Gynecology

## 2019-06-20 ENCOUNTER — Encounter: Payer: Self-pay | Admitting: Obstetrics and Gynecology

## 2019-06-20 VITALS — BP 135/99 | HR 82

## 2019-06-20 DIAGNOSIS — I10 Essential (primary) hypertension: Secondary | ICD-10-CM

## 2019-06-20 DIAGNOSIS — O09299 Supervision of pregnancy with other poor reproductive or obstetric history, unspecified trimester: Secondary | ICD-10-CM

## 2019-06-20 DIAGNOSIS — N649 Disorder of breast, unspecified: Secondary | ICD-10-CM

## 2019-06-20 DIAGNOSIS — O0992 Supervision of high risk pregnancy, unspecified, second trimester: Secondary | ICD-10-CM

## 2019-06-20 DIAGNOSIS — Z789 Other specified health status: Secondary | ICD-10-CM

## 2019-06-20 DIAGNOSIS — E119 Type 2 diabetes mellitus without complications: Secondary | ICD-10-CM

## 2019-06-20 DIAGNOSIS — O09292 Supervision of pregnancy with other poor reproductive or obstetric history, second trimester: Secondary | ICD-10-CM

## 2019-06-20 DIAGNOSIS — Z3A18 18 weeks gestation of pregnancy: Secondary | ICD-10-CM

## 2019-06-20 DIAGNOSIS — U071 COVID-19: Secondary | ICD-10-CM

## 2019-06-20 DIAGNOSIS — Z758 Other problems related to medical facilities and other health care: Secondary | ICD-10-CM

## 2019-06-20 DIAGNOSIS — O0990 Supervision of high risk pregnancy, unspecified, unspecified trimester: Secondary | ICD-10-CM

## 2019-06-20 MED ORDER — NITROFURANTOIN MONOHYD MACRO 100 MG PO CAPS: 100.0000 mg | ORAL_CAPSULE | Freq: Two times a day (BID) | ORAL | 1 refills | Status: DC

## 2019-06-20 NOTE — Progress Notes (Signed)
TELEHEALTH OBSTETRICS PRENATAL VIRTUAL VIDEO VISIT ENCOUNTER NOTE  Provider location: Center for [place] at [place]   I connected with Monica Wolfe on 06/20/19 at 10:35 AM EST by MyChart Video Encounter at home and verified that I am speaking with the correct person using two identifiers.   I discussed the limitations, risks, security and privacy concerns of performing an evaluation and management service virtually and the availability of in person appointments. I also discussed with the patient that there may be a patient responsible charge related to this service. The patient expressed understanding and agreed to proceed. Subjective:  Monica Wolfe is a 34 y.o. [id number] at [redacted]w[redacted]d being seen today for ongoing prenatal care.  She is currently monitored for the following issues for this high-risk pregnancy and has Bilateral knee pain; Health care maintenance; Supervision of high risk pregnancy, antepartum; Chronic hypertension; Breast lesion; H/O postpartum hemorrhage, currently pregnant; Language barrier; COVID-19; and Diabetes mellitus without complication ([place]) on their problem list.  Patient reports congestion and loss of sense of taste. No issues with breathing, shortness of breath. UTI symptoms have resolved today, still had symptoms yesterday, had discomfort.  Contractions: Not present. Vag. Bleeding: None.  Movement: Present. Denies any leaking of fluid.   The following portions of the patient's history were reviewed and updated as appropriate: allergies, current medications, past family history, past medical history, past social history, past surgical history and problem list.   Objective:   Vitals:   06/20/19 1040  BP: (!) 135/99  Pulse: 82   Fetal Status:     Movement: Present     General:  Alert, oriented and cooperative. Patient is in no acute distress.  Respiratory: Normal respiratory effort, no problems with respiration noted  Mental Status:  Normal mood and affect. Normal behavior. Normal judgment and thought content.  Rest of physical exam deferred due to type of encounter  Imaging:  Assessment and Plan:  Pregnancy: [id number] at [redacted]w[redacted]d   1. Chronic hypertension Mildly elevated today, non-severe range Cont baby ASA  2. Supervision of high risk pregnancy, antepartum Reviewed normal AFP  3. Breast lesion Seen at breast center 04/2019  4. H/O postpartum hemorrhage, currently pregnant  5. Language barrier Patent attorney used  6. COVID-19 Dx 06/17/19  7. Diabetes mellitus without complication ([place]) Dx early pregnancy at [facility] Has been checking CBGs FG: 80s PP: normal after lunch, reports after dinner ranging 130-160, states that no matter what she eats, her dinner PP are always elevated - reviewed options to follow up with diabetic educator to discuss her dinner options as her CBGs appear to be well controlled except for dinner, reviewed she may need to start insulin, she is agreeable to plan  Preterm labor symptoms and general obstetric precautions including but not limited to vaginal bleeding, contractions, leaking of fluid and fetal movement were reviewed in detail with the patient. I discussed the assessment and treatment plan with the patient. The patient was provided an opportunity to ask questions and all were answered. The patient agreed with the plan and demonstrated an understanding of the instructions. The patient was advised to call back or seek an in-person office evaluation/go to [facility] at [facility] for any urgent or concerning symptoms. Please refer to After Visit Summary for other counseling recommendations.   I provided 23 minutes of face-to-face time during this encounter.  Return in about 2 weeks (around 07/04/2019) for high OB, virtual.  Future Appointments  Date Time Provider [place]  06/28/2019 11:15 AM WOC-EDUCATION WOC-WOCA WOC  07/06/2019  8:30 AM WH-MFC NURSE WH-MFC  MFC-US  07/06/2019  8:30 AM WH-MFC [place] 1 WH-MFCUS MFC-US    Conan Bowens, MD Center for [facility], [facility]

## 2019-06-20 NOTE — Progress Notes (Signed)
I connected with  Monica Wolfe on 06/20/19 at 10:35 AM EST by telephone and verified that I am speaking with the correct person using two identifiers.   I discussed the limitations, risks, security and privacy concerns of performing an evaluation and management service by telephone and the availability of in person appointments. I also discussed with the patient that there may be a patient responsible charge related to this service. The patient expressed understanding and agreed to proceed.  [name], CMA 06/20/2019  10:33 AM

## 2019-06-20 NOTE — Progress Notes (Signed)
Pt states that she's still having symptoms of the UTI,feels that meds did not work.

## 2019-06-28 ENCOUNTER — Other Ambulatory Visit: Payer: Self-pay

## 2019-06-28 ENCOUNTER — Encounter: Payer: Self-pay | Attending: Family Medicine | Admitting: Registered"

## 2019-06-28 ENCOUNTER — Ambulatory Visit: Payer: Self-pay | Admitting: Registered"

## 2019-06-28 DIAGNOSIS — O24410 Gestational diabetes mellitus in pregnancy, diet controlled: Secondary | ICD-10-CM | POA: Insufficient documentation

## 2019-06-28 DIAGNOSIS — Z713 Dietary counseling and surveillance: Secondary | ICD-10-CM | POA: Insufficient documentation

## 2019-06-28 NOTE — Progress Notes (Signed)
An in-person Spanish interpreter was present for visit.  Patient was seen on 06/28/19 for Gestational Diabetes self-management follow up. EDD 11/21/19. Diet history obtained. Patient eats is not eating a variety of food groups due to recent +COVID. Patient states she is feeling better but her appetite has not returned and just the thought of eating some foods makes her feel sick.  Beverages include juice and water.  The following learning objectives were met by the patient :  Plan:  Aim to eat more protein, if not able to tolerate can use Protein drinks. Premier Protein or the Equate brand have low carb and fine to supplment a meal that includes 1/2 plantain or fruit. Consider having less juice.   If you are drinking supplement because you are not hungry for Supper, consider having a Special K protein drink to get some carb in your diet along with a few more calories than the Premier Protein.   If you notice you are losing weight aim to get more nutrition in even when you are not that hungry as you are able to tolerate.  Patient provided blood sugar log for with 1 week of readings Review of Log Book shows: FBG: within target; 2 hrs PPBG breakfast 98-169 mg/dL; 2 hrs PPBG lunch 115-153, averaging mostly around 140s.  RD discussed blood sugar log with Dr. Harolyn Rutherford to see if patient should be reevaluated before her scheduled visit next week. Dr. Harolyn Rutherford states that since her FBG is within range and considering she is not feeling well, did not want to make changes to her care at this time and waiting until next week will be okay.  Patient instructed to monitor glucose levels: FBS: 60 - 95 mg/dl 2 hour: <120 mg/dl  Patient received the following handouts: None  Patient will be seen for follow-up as needed.

## 2019-06-29 ENCOUNTER — Encounter: Payer: Self-pay | Admitting: *Deleted

## 2019-07-04 ENCOUNTER — Telehealth (INDEPENDENT_AMBULATORY_CARE_PROVIDER_SITE_OTHER): Payer: Self-pay | Admitting: Obstetrics and Gynecology

## 2019-07-04 ENCOUNTER — Other Ambulatory Visit: Payer: Self-pay

## 2019-07-04 ENCOUNTER — Encounter: Payer: Self-pay | Admitting: Obstetrics and Gynecology

## 2019-07-04 DIAGNOSIS — O09299 Supervision of pregnancy with other poor reproductive or obstetric history, unspecified trimester: Secondary | ICD-10-CM

## 2019-07-04 DIAGNOSIS — O0990 Supervision of high risk pregnancy, unspecified, unspecified trimester: Secondary | ICD-10-CM

## 2019-07-04 DIAGNOSIS — Z3A20 20 weeks gestation of pregnancy: Secondary | ICD-10-CM

## 2019-07-04 DIAGNOSIS — N649 Disorder of breast, unspecified: Secondary | ICD-10-CM

## 2019-07-04 DIAGNOSIS — Z789 Other specified health status: Secondary | ICD-10-CM

## 2019-07-04 DIAGNOSIS — E119 Type 2 diabetes mellitus without complications: Secondary | ICD-10-CM

## 2019-07-04 DIAGNOSIS — I10 Essential (primary) hypertension: Secondary | ICD-10-CM

## 2019-07-04 DIAGNOSIS — U071 COVID-19: Secondary | ICD-10-CM

## 2019-07-04 NOTE — Progress Notes (Signed)
I connected with  Monica Wolfe on 07/04/19 at  2:15 PM EST by telephone and verified that I am speaking with the correct person using two identifiers.   I discussed the limitations, risks, security and privacy concerns of performing an evaluation and management service by telephone and the availability of in person appointments. I also discussed with the patient that there may be a patient responsible charge related to this service. The patient expressed understanding and agreed to proceed.  [name], CMA 07/04/2019  2:16 PM

## 2019-07-04 NOTE — Progress Notes (Signed)
TELEHEALTH OBSTETRICS PRENATAL VIRTUAL VIDEO VISIT ENCOUNTER NOTE  Provider location: Center for [facility] [place]   I connected with Monica Wolfe on 07/04/19 at  2:15 PM EST by MyChart Video Encounter at home and verified that I am speaking with the correct person using two identifiers.   I discussed the limitations, risks, security and privacy concerns of performing an evaluation and management service virtually and the availability of in person appointments. I also discussed with the patient that there may be a patient responsible charge related to this service. The patient expressed understanding and agreed to proceed. Subjective:  Monica Wolfe is a 34 y.o. [id number] at [redacted]w[redacted]d being seen today for ongoing prenatal care.  She is currently monitored for the following issues for this high-risk pregnancy and has Bilateral knee pain; Health care maintenance; Supervision of high risk pregnancy, antepartum; Chronic hypertension; Breast lesion; H/O postpartum hemorrhage, currently pregnant; Language barrier; COVID-19; and Diabetes mellitus without complication (HCC) on their problem list.  Patient reports no complaints.  Contractions: Not present. Vag. Bleeding: None.  Movement: Present. Denies any leaking of fluid.   The following portions of the patient's history were reviewed and updated as appropriate: allergies, current medications, past family history, past medical history, past social history, past surgical history and problem list.   Objective:  There were no vitals filed for this visit.  Fetal Status:     Movement: Present     General:  Alert, oriented and cooperative. Patient is in no acute distress.  Respiratory: Normal respiratory effort, no problems with respiration noted  Mental Status: Normal mood and affect. Normal behavior. Normal judgment and thought content.  Rest of physical exam deferred due to type of encounter  Imaging: No results  found.  Assessment and Plan:  Pregnancy: [id number] at [redacted]w[redacted]d  1. Diabetes mellitus without complication (HCC) Dx early pregnancy at [facility] Has seen diabetic educator, states her CBGs have been much better since she had the diabetic education FG: has had normal fasting values PP: does not have logs but all that she can remember are within range  2. Chronic hypertension No BP available today Cont baby ASA States she checked it yesterday and it was normal   3. Supervision of high risk pregnancy, antepartum - dating changed today per 8 week [place] found in records - updated records requested from [facility] as the labs are incomplete  4. Breast lesion Has been seen at breast center  5. H/O postpartum hemorrhage, currently pregnant  6. Language barrier Engineer, structural used  7. COVID-19 Dx 06/17/19  Preterm labor symptoms and general obstetric precautions including but not limited to vaginal bleeding, contractions, leaking of fluid and fetal movement were reviewed in detail with the patient. I discussed the assessment and treatment plan with the patient. The patient was provided an opportunity to ask questions and all were answered. The patient agreed with the plan and demonstrated an understanding of the instructions. The patient was advised to call back or seek an in-person office evaluation/go to [facility] at [facility] for any urgent or concerning symptoms. Please refer to After Visit Summary for other counseling recommendations.   I provided 20 minutes of face-to-face time during this encounter.  Return in about 2 weeks (around 07/18/2019) for high OB, virtual.  Future Appointments  Date Time Provider Department Center  07/06/2019  8:30 AM WH-MFC NURSE WH-MFC MFC-US  07/06/2019  8:30 AM WH-MFC [place] 1 WH-MFCUS MFC-US    Conan Bowens,  MD Center for [place]

## 2019-07-04 NOTE — Progress Notes (Signed)
Pt is not home to take BP, will take once she gets home & call us back with readings.

## 2019-07-05 ENCOUNTER — Other Ambulatory Visit: Payer: Self-pay

## 2019-07-06 ENCOUNTER — Ambulatory Visit (HOSPITAL_COMMUNITY): Payer: Self-pay | Admitting: *Deleted

## 2019-07-06 ENCOUNTER — Other Ambulatory Visit: Payer: Self-pay

## 2019-07-06 ENCOUNTER — Ambulatory Visit (HOSPITAL_COMMUNITY)
Admission: RE | Admit: 2019-07-06 | Discharge: 2019-07-06 | Disposition: A | Discharge status: Home / Self Care | Payer: Self-pay | Source: Ambulatory Visit | Attending: Obstetrics and Gynecology | Admitting: Obstetrics and Gynecology

## 2019-07-06 ENCOUNTER — Other Ambulatory Visit (HOSPITAL_COMMUNITY): Payer: Self-pay | Admitting: *Deleted

## 2019-07-06 ENCOUNTER — Encounter (HOSPITAL_COMMUNITY): Payer: Self-pay

## 2019-07-06 DIAGNOSIS — O99212 Obesity complicating pregnancy, second trimester: Secondary | ICD-10-CM | POA: Insufficient documentation

## 2019-07-06 DIAGNOSIS — O10912 Unspecified pre-existing hypertension complicating pregnancy, second trimester: Secondary | ICD-10-CM

## 2019-07-06 DIAGNOSIS — O0990 Supervision of high risk pregnancy, unspecified, unspecified trimester: Secondary | ICD-10-CM | POA: Insufficient documentation

## 2019-07-06 DIAGNOSIS — Z3A20 20 weeks gestation of pregnancy: Secondary | ICD-10-CM

## 2019-07-06 DIAGNOSIS — O09292 Supervision of pregnancy with other poor reproductive or obstetric history, second trimester: Secondary | ICD-10-CM

## 2019-07-06 DIAGNOSIS — O10012 Pre-existing essential hypertension complicating pregnancy, second trimester: Secondary | ICD-10-CM

## 2019-07-06 DIAGNOSIS — O24112 Pre-existing diabetes mellitus, type 2, in pregnancy, second trimester: Secondary | ICD-10-CM

## 2019-07-18 ENCOUNTER — Encounter: Payer: Self-pay | Admitting: Family Medicine

## 2019-07-26 ENCOUNTER — Ambulatory Visit: Payer: Self-pay | Admitting: Registered"

## 2019-07-26 ENCOUNTER — Encounter: Payer: Self-pay | Attending: Family Medicine | Admitting: Registered"

## 2019-07-26 ENCOUNTER — Other Ambulatory Visit: Payer: Self-pay

## 2019-07-26 DIAGNOSIS — Z713 Dietary counseling and surveillance: Secondary | ICD-10-CM | POA: Insufficient documentation

## 2019-07-26 DIAGNOSIS — O24410 Gestational diabetes mellitus in pregnancy, diet controlled: Secondary | ICD-10-CM | POA: Insufficient documentation

## 2019-07-26 DIAGNOSIS — E119 Type 2 diabetes mellitus without complications: Secondary | ICD-10-CM

## 2019-07-26 NOTE — Progress Notes (Signed)
In-person [facility] Spanish interpreter, Laveda Norman, was present for visit.  Patient was seen on 07/26/19 for Gestational Diabetes self-management follow up. EDD 11/21/19. Diet history obtained. Patient is feeling better and eating more protein since last visit. Patient has reduces sweets and stopped drinking juice. Beverages include water and occasionally a few sips of soda to curb craving.   Patient provided 1 month blood sugar log:  FBS: within target; 2 hrs post meals last 2 weeks all readings except 3 were within target range. Out of range #s 130, 128, 125 mg/dL. Pt states those readings are when she ate too many sweets and so she has stopped keeping them in the house.  Because patient goal is to avoid medication and she is [redacted]w[redacted]d, RD will follow up in 6 weeks to make sure she is eating enough and still staying within target range.  Next visit RD will evaluate patient's technique of checking blood sugar because she reported getting different readings with 2 checks at same time.  Plan:  Continue to include plenty of protein with meals and snacks. If blood sugar starts to increase above target as pregnancy progresses, start measuring carbohydrate intake and bring food log along with blood sugar log to next visit.  Patient instructed to monitor glucose levels: FBS: 60 - 95 mg/dl 2 hour: <525 mg/dl  Patient received the following handouts: None  Patient will be seen for follow-up in 6 weeks.

## 2019-08-03 ENCOUNTER — Other Ambulatory Visit: Payer: Self-pay

## 2019-08-03 ENCOUNTER — Ambulatory Visit (HOSPITAL_COMMUNITY): Payer: Self-pay | Admitting: *Deleted

## 2019-08-03 ENCOUNTER — Encounter (HOSPITAL_COMMUNITY): Payer: Self-pay

## 2019-08-03 ENCOUNTER — Ambulatory Visit (HOSPITAL_COMMUNITY)
Admission: RE | Admit: 2019-08-03 | Discharge: 2019-08-03 | Disposition: A | Discharge status: Home / Self Care | Payer: Self-pay | Source: Ambulatory Visit | Attending: Obstetrics and Gynecology | Admitting: Obstetrics and Gynecology

## 2019-08-03 ENCOUNTER — Other Ambulatory Visit (HOSPITAL_COMMUNITY): Payer: Self-pay | Admitting: *Deleted

## 2019-08-03 DIAGNOSIS — O0990 Supervision of high risk pregnancy, unspecified, unspecified trimester: Secondary | ICD-10-CM | POA: Insufficient documentation

## 2019-08-03 DIAGNOSIS — O24312 Unspecified pre-existing diabetes mellitus in pregnancy, second trimester: Secondary | ICD-10-CM

## 2019-08-03 DIAGNOSIS — O10912 Unspecified pre-existing hypertension complicating pregnancy, second trimester: Secondary | ICD-10-CM | POA: Insufficient documentation

## 2019-08-03 DIAGNOSIS — Z3A23 23 weeks gestation of pregnancy: Secondary | ICD-10-CM

## 2019-08-03 DIAGNOSIS — Z362 Encounter for other antenatal screening follow-up: Secondary | ICD-10-CM

## 2019-08-05 ENCOUNTER — Other Ambulatory Visit: Payer: Self-pay

## 2019-08-05 ENCOUNTER — Telehealth: Payer: Self-pay

## 2019-08-05 ENCOUNTER — Ambulatory Visit (INDEPENDENT_AMBULATORY_CARE_PROVIDER_SITE_OTHER): Payer: Self-pay | Admitting: Obstetrics & Gynecology

## 2019-08-05 VITALS — BP 125/85 | HR 87 | Wt 225.8 lb

## 2019-08-05 DIAGNOSIS — E119 Type 2 diabetes mellitus without complications: Secondary | ICD-10-CM

## 2019-08-05 DIAGNOSIS — O0990 Supervision of high risk pregnancy, unspecified, unspecified trimester: Secondary | ICD-10-CM

## 2019-08-05 DIAGNOSIS — O0992 Supervision of high risk pregnancy, unspecified, second trimester: Secondary | ICD-10-CM

## 2019-08-05 DIAGNOSIS — I10 Essential (primary) hypertension: Secondary | ICD-10-CM

## 2019-08-05 DIAGNOSIS — Z3A23 23 weeks gestation of pregnancy: Secondary | ICD-10-CM

## 2019-08-05 MED ORDER — PRENATAL MULTIVITAMIN CH: 1.0000 | ORAL_TABLET | Freq: Every day | ORAL | 6 refills | Status: DC

## 2019-08-05 MED ORDER — PRENATAL VITAMINS 28-0.8 MG PO TABS: 1.0000 | ORAL_TABLET | Freq: Every day | ORAL | 6 refills | Status: DC

## 2019-08-05 NOTE — Progress Notes (Deleted)
  Subjective:    Monica Wolfe is a [id number] [redacted]w[redacted]d being seen today for her first obstetrical visit.  Her obstetrical history is significant for {ob risk factors:10154}. Patient {does/does not:19097} intend to breast feed. Pregnancy history fully reviewed.  Patient reports {sx:14538}.  Vitals:   08/05/19 0832  BP: 125/85  Pulse: 87  Weight: 225 lb 12.8 oz (102.4 kg)    HISTORY: OB History  Gravida Para Term Preterm AB Living  7 4 4   2 4   SAB TAB Ectopic Multiple Live Births  2       4    # Outcome Date GA Lbr Len/2nd Weight Sex Delivery Anes PTL Lv  7 Current           6 Term      Vag-Spont   LIV  5 Term      Vag-Spont   LIV  4 Term      Vag-Spont   LIV  3 Term      Vag-Spont   LIV  2 SAB           1 SAB            Past Medical History:  Diagnosis Date  . Chronic hypertension 05/10/2019  . Diabetes mellitus without complication [facility])    Past Surgical History:  Procedure Laterality Date  . LITHOTRIPSY  10/17/2013   Family History  Problem Relation Age of Onset  . Diabetes Maternal Grandmother      Exam    Uterus:     Pelvic Exam:    Perineum: {Exam; anus and perineum:14598}   Vulva: {vulva exam:14487}   Vagina:  {vaginal exam:14498}   pH: ***   Cervix: {cervix exam:14595}   Adnexa: {adnexa:12223}   Bony Pelvis: {desc; pelvis:14491}  System: Breast:  {Exam; breast:13139::"normal appearance, no masses or tenderness"}   Skin: {pe skin brief ob:314459::"normal coloration and turgor, no rashes"}    Neurologic: {Exam; neuro:16375}   Extremities: {Exam; extremities:15096}   HEENT {exam; heent:31974}   Mouth/Teeth {pe mouth simple ob:314450::"mucous membranes moist, pharynx normal without lesions"}   Neck {Neck (ob):32092}   Cardiovascular: {HEART EXAM HEM/ONC:21750}   Respiratory:  {Exam; respiratory:5782::"appears well, vitals normal, no respiratory distress, acyanotic, normal RR","ear and throat exam is normal","neck free of mass or  lymphadenopathy","chest clear, no wheezing, crepitations, rhonchi, normal symmetric air entry"}   Abdomen: {Exam; abdomen:16834}   Urinary: {exam; urinary female:30845}      Assessment:    Pregnancy: 12/17/2013 Patient Active Problem List   Diagnosis Date Noted  . Diabetes mellitus without complication (HCC)   . COVID-19 06/18/2019  . Supervision of high risk pregnancy, antepartum 05/10/2019  . Chronic hypertension 05/10/2019  . Breast lesion 05/10/2019  . H/O postpartum hemorrhage, currently pregnant 05/10/2019  . Language barrier 05/10/2019  . Bilateral knee pain 07/19/2015  . Health care maintenance 07/19/2015        Plan:     Initial labs drawn. Prenatal vitamins. Problem list reviewed and updated. Genetic Screening discussed {GENETIC SCREENING TEST:22046}: {requests/ordered/declines:14581}.  Ultrasound discussed; fetal survey: {requests/ordered/declines:14581}.  Follow up in {numbers 0-4:31231} weeks. ***% of *** min visit spent on counseling and coordination of care.  ***   09/16/2015 08/05/2019

## 2019-08-05 NOTE — Telephone Encounter (Signed)
Called pt using Spanish [employer] Monica Wolfe id# [phone] to inform her of Cardiology appointment on 08/08/19 @ 9:40a & to arrive at 9:25am, Pt verbalized understanding.

## 2019-08-05 NOTE — Patient Instructions (Signed)
Taquicardia supraventricular en los adultos Supraventricular Tachycardia, Adult La taquicardia supraventricular (TSV) es una alteracin en los latidos cardacos. Hace que el corazn lata muy rpido y que luego lata con una frecuencia normal. La cantidad normal de latidos cardacos en reposo es de entre 60 y 100latidos por minuto. Esta afeccin puede hacer que el corazn lata con una frecuencia mayor a 150 veces por minuto. Los episodios de latidos cardacos acelerados pueden ser aterradores, pero generalmente no son peligrosos. En algunos casos, pueden derivar en insuficiencia cardaca si:  Ocurren muchas veces por da.  Duran ms que unos pocos segundos. Cules son las causas?  Un latido cardaco normal comienza cuando una zona llamada ndulo sinoauricular enva una seal elctrica. En casos de TSV, otras zonas del corazn envan seales que estorban la seal del ndulo sinoauricular. Qu incrementa el riesgo? Es ms probable que tenga esta afeccin si usted:  Tiene entre 12 y 4aos.  Es mujer. Los siguientes factores pueden hacer que sea ms propenso a Aeronautical engineer afeccin:  Librarian, academic.  Cansancio.  Fumar.  Estimulantes, como la cocana y Education officer, environmental.  El alcohol.  La cafena.  Embarazo.  Estar preocupado o nervioso (ansioso). Cules son los signos o los sntomas?  Palpitaciones.  Sensacin de que el corazn se saltea latidos (palpitaciones).  Debilidad.  Dificultad para tomar suficiente aire.  Dolor u opresin en el pecho.  Sentirse como si se fuera a Geneticist, molecular).  Sentirse preocupado o nervioso.  Mareos.  Sudoracin.  Malestar estomacal (nuseas).  Desmayos.  Cansancio. En algunos casos, no hay sntomas. Cmo se trata?  Estimulacin del nervio vago. Las [employer] de hacerlo incluyen: ? Photographer respiracin y Quarry manager, como si estuviera moviendo el intestino (haciendo una deposicin). ? Masajear un rea de un lado del cuello.  No intente hacer esto por su cuenta. Slo un mdico debe hacerlo. Si se hace de manera incorrecta, puede provocar un accidente cerebrovascular. ? Inclinarse hacia adelante con la cabeza entre las piernas. ? Toser mientras se inclina hacia adelante con la [employer] las piernas. ? Cerrar los ojos y [employer] globos oculares. Consulte al mdico cmo hacerlo.  Medicamentos que previenen los episodios.  Medicamentos para detener una crisis que se administran a travs de un tubo (catter) intravenoso en el hospital.  Un choque elctrico leve (cardioversin) que detiene el episodio.  Ablacin por radiofrecuencia. En este procedimiento, se [place] un tubo pequeo y delgado (catter) para enviar energa a la zona que provoca los latidos cardacos rpidos. Si no tiene sntomas, es posible que no necesite tratamiento. Siga estas instrucciones en su casa: Estrs  Evite las cosas que lo hacen sentir estresado.  Para lidiar con el estrs, intente lo siguiente: ? Practicar yoga o meditacin, o estar en contacto con la naturaleza. ? Escuchar msica relajante. ? Practicar la respiracin profunda. ? Dar pasos para estar saludable, como dormir mucho, Radio producer ejercicio y seguir una dieta equilibrada. ? Consultar a un mdico de salud mental. Estilo de vida   Trate de dormir como mnimo 7 horas todas las noches.  No consuma ningn producto que contenga nicotina o tabaco, como cigarrillos, cigarrillos electrnicos y tabaco de Theatre manager. Si necesita ayuda para dejar de fumar, consulte al mdico.  Sea consciente del efecto que le produce el alcohol. ? Si el alcohol le provoca latidos cardacos acelerados, no consuma alcohol. ? Si el alcohol no parece afectar sus latidos cardacos, limite el consumo a no ms de 1 medida por da si es [place] y no  est embarazada, y 2 medidas por da si es hombre. En los [place] una medida es cualquiera de estas:  12 onzas (355 ml) de cerveza.  5 onzas (148 ml) de vino.   1onzas (20ml) de bebidas alcohlicas de alta graduacin.  Sea consciente del efecto que le produce la cafena. ? Si la cafena le provoca latidos cardacos acelerados, no coma, beba ni consuma nada que contenga cafena. ? Si la cafena no parece afectar sus latidos cardacos, limite la cantidad de cafena que come, bebe o consume.  No consuma estimulantes. Si necesita ayuda para dejar de fumar, consulte al mdico. Instrucciones generales  Mantenga un peso saludable.  Haga ejercicio regularmente. [place] [employer] le sugiera actividades adecuadas para usted. Pruebe alguna de estas actividades, o una combinacin de ellas: ? 150 minutos semanales de ejercicio leve, como caminar o practicar yoga. ? 75 minutos semanales de ejercicio de alta intensidad, como correr o nadar.  Haga tratamientos del nervio vago para Public affairs consultant los latidos cardacos como se lo haya indicado el mdico.  Tome los medicamentos de venta libre y los recetados solamente como se lo haya indicado el mdico.  Consulting civil engineer a todas las visitas de seguimiento como se lo haya indicado el mdico. Esto es importante. Comunquese con un mdico si:  Tiene latidos cardacos acelerados con ms frecuencia.  Los episodios de latidos cardacos acelerados duran ms que antes.  Los tratamientos que hace en el hogar para desacelerar los latidos cardacos no son tiles.  Aparecen nuevos sntomas. Solicite ayuda inmediatamente si:  Electronics engineer.  Sus sntomas empeoran.  Tiene dificultad para respirar.  El corazn le late [place] rpido durante ms de 20 minutos.  Se desmaya. Estos sntomas pueden Sales executive. No espere a ver si los sntomas desaparecen. Solicite atencin mdica de inmediato. Comunquese con el servicio de emergencias de su localidad (911 en los [place]). No conduzca por sus propios medios [employer]. Resumen  La TSV es una alteracin en los latidos cardacos.  Esta  afeccin puede hacer que el corazn lata con una frecuencia mayor a 150 veces por minuto.  El tratamiento depende de la frecuencia con la que se presenta la afeccin y de los sntomas. Esta informacin no tiene Marine scientist el consejo del mdico. Asegrese de hacerle al mdico cualquier pregunta que tenga. Document Revised: 06/16/2018 Document Reviewed: 06/16/2018 Elsevier Patient Education  [place].

## 2019-08-05 NOTE — Progress Notes (Signed)
   PRENATAL VISIT NOTE  Subjective:  Monica Wolfe is a 34 y.o. [id number] at [redacted]w[redacted]d being seen today for ongoing prenatal care.  She is currently monitored for the following issues for this high-risk pregnancy and has Bilateral knee pain; Health care maintenance; Supervision of high risk pregnancy, antepartum; Chronic hypertension; Breast lesion; H/O postpartum hemorrhage, currently pregnant; Language barrier; COVID-19; and Diabetes mellitus without complication (HCC) on their problem list.  Patient reports episodes of tachycardia with SOB, getting more frequent.  Contractions: Not present. Vag. Bleeding: None.  Movement: Present. Denies leaking of fluid.   The following portions of the patient's history were reviewed and updated as appropriate: allergies, current medications, past family history, past medical history, past social history, past surgical history and problem list.   Objective:   Vitals:   08/05/19 0832  BP: 125/85  Pulse: 87  Weight: 225 lb 12.8 oz (102.4 kg)    Fetal Status: Fetal Heart Rate (bpm): 156   Movement: Present     General:  Alert, oriented and cooperative. Patient is in no acute distress.  Skin: Skin is warm and dry. No rash noted.   Cardiovascular: Normal heart rate noted  Respiratory: Normal respiratory effort, no problems with respiration noted  Abdomen: Soft, gravid, appropriate for gestational age.  Pain/Pressure: Present     Pelvic: Cervical exam deferred        Extremities: Normal range of motion.  Edema: Trace  Mental Status: Normal mood and affect. Normal behavior. Normal judgment and thought content.   Assessment and Plan:  Pregnancy: [id number] at [redacted]w[redacted]d 1. Chronic hypertension tachycardia - Ambulatory referral to Cardiology  2. Diabetes mellitus without complication (HCC) FBS and PP all in range diet control  3. Supervision of high risk pregnancy, antepartum May need Holter monitor - Ambulatory referral to Cardiology  Preterm  labor symptoms and general obstetric precautions including but not limited to vaginal bleeding, contractions, leaking of fluid and fetal movement were reviewed in detail with the patient. Please refer to After Visit Summary for other counseling recommendations.   Return in about 3 weeks (around 08/26/2019).  Future Appointments  Date Time Provider Department Center  08/31/2019 10:45 AM WH-MFC NURSE WH-MFC MFC-US  08/31/2019 10:45 AM WH-MFC [place] 2 WH-MFCUS MFC-US  09/06/2019  8:15 AM WOC-EDUCATION WOC-WOCA WOC    Scheryl Darter, MD

## 2019-08-08 ENCOUNTER — Ambulatory Visit (INDEPENDENT_AMBULATORY_CARE_PROVIDER_SITE_OTHER): Payer: Self-pay | Admitting: Cardiovascular Disease

## 2019-08-08 ENCOUNTER — Telehealth: Payer: Self-pay | Admitting: *Deleted

## 2019-08-08 ENCOUNTER — Encounter: Payer: Self-pay | Admitting: Cardiovascular Disease

## 2019-08-08 ENCOUNTER — Other Ambulatory Visit: Payer: Self-pay

## 2019-08-08 VITALS — BP 122/70 | HR 92 | Ht 66.0 in | Wt 228.0 lb

## 2019-08-08 DIAGNOSIS — R0602 Shortness of breath: Secondary | ICD-10-CM

## 2019-08-08 DIAGNOSIS — R002 Palpitations: Secondary | ICD-10-CM

## 2019-08-08 NOTE — Progress Notes (Signed)
Chief Complaint  Patient presents with  . New Patient (Initial Visit)    HTN    History of Present Illness: 34 yo female with history of gestational HTN and DM who is here today as a new patient for evaluation of palpitations. Her heart races while at rest 3 times per week. This lasts for one minute and is associated with dyspnea. She is now [redacted] weeks pregnant. She has 4 children at home. No chest pain.  She has had gestational diabetes and HTN with prior pregnancies. She does not smoke or use alcohol. No prior cardiac issues. She does not speak [place]. An interpretor is here with her today.   Primary Care Physician: Department, [facility]   Past Medical History:  Diagnosis Date  . Chronic hypertension 05/10/2019  . Gestational diabetes mellitus     Past Surgical History:  Procedure Laterality Date  . LITHOTRIPSY  10/17/2013    Current Outpatient Medications  Medication Sig Dispense Refill  . aspirin 81 MG EC tablet Take 81 mg by mouth daily. Swallow whole.    . Prenatal Vit-Fe Fumarate-FA (PRENATAL VITAMINS) 28-0.8 MG TABS Take 1 tablet by mouth daily. 30 tablet 6   Current Facility-Administered Medications  Medication Dose Route Frequency Provider Last Rate Last Admin  . aspirin chewable tablet 81 mg  81 mg Oral Daily Sloan Leiter, MD        No Known Allergies  Social History   Socioeconomic History  . Marital status: Single    Spouse name: Not on file  . Number of children: Not on file  . Years of education: Not on file  . Highest education level: Not on file  Occupational History  . Not on file  Tobacco Use  . Smoking status: Never Smoker  . Smokeless tobacco: Never Used  Substance and Sexual Activity  . Alcohol use: No  . Drug use: No  . Sexual activity: Yes  Other Topics Concern  . Not on file  Social History Narrative  . Not on file   Social Determinants of Health   Financial Resource Strain:   . Difficulty of Paying Living Expenses:  Not on file  Food Insecurity:   . Worried About Charity fundraiser in the Last Year: Not on file  . Ran Out of Food in the Last Year: Not on file  Transportation Needs:   . Lack of Transportation (Medical): Not on file  . Lack of Transportation (Non-Medical): Not on file  Physical Activity:   . Days of Exercise per Week: Not on file  . Minutes of Exercise per Session: Not on file  Stress:   . Feeling of Stress : Not on file  Social Connections:   . Frequency of Communication with Friends and Family: Not on file  . Frequency of Social Gatherings with Friends and Family: Not on file  . Attends Religious Services: Not on file  . Active Member of Clubs or Organizations: Not on file  . Attends Archivist Meetings: Not on file  . Marital Status: Not on file  Intimate Partner Violence:   . Fear of Current or Ex-Partner: Not on file  . Emotionally Abused: Not on file  . Physically Abused: Not on file  . Sexually Abused: Not on file    Family History  Problem Relation Age of Onset  . Diabetes Maternal Grandmother   . CAD Father    Review of Systems:  As stated in the HPI and otherwise  negative.   BP 122/70   Pulse 92   Ht 5\' 6"  (1.676 m)   Wt 228 lb (103.4 kg)   LMP 01/17/2019   SpO2 98%   BMI 36.80 kg/m   Physical Examination: General: Well developed, well nourished, NAD  HEENT: OP clear, mucus membranes moist  SKIN: warm, dry. No rashes. Neuro: No focal deficits  Musculoskeletal: Muscle strength 5/5 all ext  Psychiatric: Mood and affect normal  Neck: No JVD, no carotid bruits, no thyromegaly, no lymphadenopathy.  Lungs:Clear bilaterally, no wheezes, rhonci, crackles Cardiovascular: Regular rate and rhythm. No murmurs, gallops or rubs. Abdomen:Soft. Bowel sounds present. Non-tender.  Extremities: No lower extremity edema. Pulses are 2 + in the bilateral DP/PT.  EKG:  EKG is ordered today. The ekg ordered today demonstrates NSR, rate 92 bpm.   Recent  Labs: 05/10/2019: ALT 10; BUN 7; Creatinine, Ser 0.50; Potassium 4.9; Sodium 135   Lipid Panel No results found for: CHOL, TRIG, HDL, CHOLHDL, VLDL, LDLCALC, LDLDIRECT   Wt Readings from Last 3 Encounters:  08/08/19 228 lb (103.4 kg)  08/05/19 225 lb 12.8 oz (102.4 kg)  06/17/19 215 lb (97.5 kg)     Assessment and Plan:   1. Palpitations/dyspnea: She is now [redacted] weeks pregnant and having palpitations. Will arrange a 7 day Zio patch cardiac monitor and an echo.   Current medicines are reviewed at length with the patient today.  The patient does not have concerns regarding medicines.  The following changes have been made:  no change  Labs/ tests ordered today include:   Orders Placed This Encounter  Procedures  . LONG TERM MONITOR (3-14 DAYS)  . EKG 12-Lead  . ECHOCARDIOGRAM COMPLETE     Disposition:   FU with me or care team APP in 4 weeks.    Signed, 08/15/19, MD 08/08/2019 9:59 AM    [facility] [address] [place], [place], [place]  [place] Phone: [phone]; Fax: [phone]

## 2019-08-08 NOTE — Patient Instructions (Signed)
Medication Instructions:  Your provider recommends that you continue on your current medications as directed. Please refer to the Current Medication list given to you today.   *If you need a refill on your cardiac medications before your next appointment, please call your pharmacy*  Testing/Procedures: Your physician has requested that you have an echocardiogram. Echocardiography is a painless test that uses sound waves to create images of your heart. It provides your doctor with information about the size and shape of your heart and how well your heart's chambers and valves are working. This procedure takes approximately one hour. There are no restrictions for this procedure.  Dr. Clifton James recommends you wear a HEART MONITOR for 1 week. This will get mailed to your home.  Follow-Up: Your provider recommends that you schedule a follow-up appointment in 1 month.

## 2019-08-08 NOTE — Telephone Encounter (Signed)
Using spanish interpreter [phone],  Patient to be enrolled with [employer] to mail a 7 day ZIO XT long term holter monitor.  Patient to call [phone], select option 1, select option 1, select option 2, ask for spanish interpreter, ask to apply for financial assistance.  Monitor will be mailed to the patients home.

## 2019-08-09 ENCOUNTER — Ambulatory Visit (HOSPITAL_COMMUNITY): Payer: Self-pay | Attending: Internal Medicine

## 2019-08-09 DIAGNOSIS — R0602 Shortness of breath: Secondary | ICD-10-CM | POA: Insufficient documentation

## 2019-08-09 DIAGNOSIS — R002 Palpitations: Secondary | ICD-10-CM | POA: Insufficient documentation

## 2019-08-11 ENCOUNTER — Ambulatory Visit (INDEPENDENT_AMBULATORY_CARE_PROVIDER_SITE_OTHER): Payer: Self-pay

## 2019-08-11 DIAGNOSIS — R002 Palpitations: Secondary | ICD-10-CM

## 2019-08-29 ENCOUNTER — Ambulatory Visit (INDEPENDENT_AMBULATORY_CARE_PROVIDER_SITE_OTHER): Payer: Self-pay | Admitting: Family Medicine

## 2019-08-29 ENCOUNTER — Other Ambulatory Visit: Payer: Self-pay

## 2019-08-29 VITALS — BP 123/81 | HR 85 | Wt 229.0 lb

## 2019-08-29 DIAGNOSIS — E119 Type 2 diabetes mellitus without complications: Secondary | ICD-10-CM

## 2019-08-29 DIAGNOSIS — Z23 Encounter for immunization: Secondary | ICD-10-CM

## 2019-08-29 DIAGNOSIS — O0990 Supervision of high risk pregnancy, unspecified, unspecified trimester: Secondary | ICD-10-CM

## 2019-08-29 DIAGNOSIS — Z3A27 27 weeks gestation of pregnancy: Secondary | ICD-10-CM

## 2019-08-29 DIAGNOSIS — O09299 Supervision of pregnancy with other poor reproductive or obstetric history, unspecified trimester: Secondary | ICD-10-CM

## 2019-08-29 DIAGNOSIS — O24112 Pre-existing diabetes mellitus, type 2, in pregnancy, second trimester: Secondary | ICD-10-CM

## 2019-08-29 DIAGNOSIS — U071 COVID-19: Secondary | ICD-10-CM

## 2019-08-29 DIAGNOSIS — I10 Essential (primary) hypertension: Secondary | ICD-10-CM

## 2019-08-29 DIAGNOSIS — Z789 Other specified health status: Secondary | ICD-10-CM

## 2019-08-29 DIAGNOSIS — O10012 Pre-existing essential hypertension complicating pregnancy, second trimester: Secondary | ICD-10-CM

## 2019-08-29 DIAGNOSIS — O09292 Supervision of pregnancy with other poor reproductive or obstetric history, second trimester: Secondary | ICD-10-CM

## 2019-08-29 DIAGNOSIS — Z8616 Personal history of COVID-19: Secondary | ICD-10-CM

## 2019-08-29 DIAGNOSIS — Z603 Acculturation difficulty: Secondary | ICD-10-CM

## 2019-08-29 NOTE — Progress Notes (Signed)
Subjective:  Monica Wolfe is a 34 y.o. [id number] at [redacted]w[redacted]d being seen today for ongoing prenatal care.  She is currently monitored for the following issues for this high-risk pregnancy and has Bilateral knee pain; Health care maintenance; Supervision of high risk pregnancy, antepartum; Chronic hypertension; Breast lesion; H/O postpartum hemorrhage, currently pregnant; Language barrier; COVID-19; and Diabetes mellitus without complication (HCC) on their problem list.  GDM: Patient diet controlled. Fasting: 71-93. No elevated values 2hr PP: occasional elevated 7elevated over past 2 weeks, usually after lunch  Patient reports bleeding after sex. Had heavier bleeding yesterday, but spotting today. Good fetal movement..  Contractions: Not present. Vag. Bleeding: None.  Movement: Present. Denies leaking of fluid.   The following portions of the patient's history were reviewed and updated as appropriate: allergies, current medications, past family history, past medical history, past social history, past surgical history and problem list. Problem list updated.  Objective:   Vitals:   08/29/19 0844  BP: 123/81  Pulse: 85  Weight: 229 lb (103.9 kg)    Fetal Status: Fetal Heart Rate (bpm): 154   Movement: Present     General:  Alert, oriented and cooperative. Patient is in no acute distress.  Skin: Skin is warm and dry. No rash noted.   Cardiovascular: Normal heart rate noted  Respiratory: Normal respiratory effort, no problems with respiration noted  Abdomen: Soft, gravid, appropriate for gestational age. Pain/Pressure: Present     Pelvic: Vag. Bleeding: None     Cervical exam deferred      no blood on exam  Extremities: Normal range of motion.  Edema: Trace  Mental Status: Normal mood and affect. Normal behavior. Normal judgment and thought content.   Urinalysis:      Assessment and Plan:  Pregnancy: [id number] at [redacted]w[redacted]d  1. Supervision of high risk pregnancy, antepartum FHT and FH  normal. Continue to watch bleeding - if increases again, pt to call.  2. Chronic hypertension BP controlled ASA 81mg  for growth in 2 days  3. Diabetes mellitus without complication (HCC) Continue diet control  4. COVID-19 Diagnosed 06/17/19 No long term side effects noted  5. Language barrier Interpreter used  6. H/O postpartum hemorrhage, currently pregnant Plan aggressive postpartum control  Preterm labor symptoms and general obstetric precautions including but not limited to vaginal bleeding, contractions, leaking of fluid and fetal movement were reviewed in detail with the patient. Please refer to After Visit Summary for other counseling recommendations.  No follow-ups on file.   08/15/19, DO

## 2019-08-31 ENCOUNTER — Other Ambulatory Visit: Payer: Self-pay

## 2019-08-31 ENCOUNTER — Other Ambulatory Visit (HOSPITAL_COMMUNITY): Payer: Self-pay | Admitting: *Deleted

## 2019-08-31 ENCOUNTER — Ambulatory Visit (HOSPITAL_COMMUNITY)
Admission: RE | Admit: 2019-08-31 | Discharge: 2019-08-31 | Disposition: A | Discharge status: Home / Self Care | Payer: Self-pay | Source: Ambulatory Visit | Attending: Obstetrics and Gynecology | Admitting: Obstetrics and Gynecology

## 2019-08-31 ENCOUNTER — Encounter (HOSPITAL_COMMUNITY): Payer: Self-pay

## 2019-08-31 ENCOUNTER — Ambulatory Visit (HOSPITAL_COMMUNITY): Payer: Self-pay | Admitting: *Deleted

## 2019-08-31 DIAGNOSIS — O24312 Unspecified pre-existing diabetes mellitus in pregnancy, second trimester: Secondary | ICD-10-CM | POA: Insufficient documentation

## 2019-08-31 DIAGNOSIS — O0990 Supervision of high risk pregnancy, unspecified, unspecified trimester: Secondary | ICD-10-CM

## 2019-08-31 DIAGNOSIS — O10919 Unspecified pre-existing hypertension complicating pregnancy, unspecified trimester: Secondary | ICD-10-CM

## 2019-08-31 DIAGNOSIS — Z362 Encounter for other antenatal screening follow-up: Secondary | ICD-10-CM

## 2019-08-31 DIAGNOSIS — E669 Obesity, unspecified: Secondary | ICD-10-CM

## 2019-08-31 DIAGNOSIS — O99212 Obesity complicating pregnancy, second trimester: Secondary | ICD-10-CM

## 2019-08-31 DIAGNOSIS — O10012 Pre-existing essential hypertension complicating pregnancy, second trimester: Secondary | ICD-10-CM

## 2019-08-31 DIAGNOSIS — Z3A27 27 weeks gestation of pregnancy: Secondary | ICD-10-CM

## 2019-08-31 DIAGNOSIS — O09292 Supervision of pregnancy with other poor reproductive or obstetric history, second trimester: Secondary | ICD-10-CM

## 2019-08-31 DIAGNOSIS — O24112 Pre-existing diabetes mellitus, type 2, in pregnancy, second trimester: Secondary | ICD-10-CM

## 2019-09-01 ENCOUNTER — Other Ambulatory Visit (HOSPITAL_COMMUNITY): Payer: Self-pay | Admitting: Obstetrics & Gynecology

## 2019-09-06 ENCOUNTER — Other Ambulatory Visit: Payer: Self-pay

## 2019-09-06 ENCOUNTER — Encounter (HOSPITAL_COMMUNITY): Payer: Self-pay | Admitting: Obstetrics & Gynecology

## 2019-09-06 ENCOUNTER — Inpatient Hospital Stay (HOSPITAL_COMMUNITY)
Admission: AD | Admit: 2019-09-06 | Discharge: 2019-09-21 | DRG: 806 | Disposition: A | Discharge status: Home / Self Care | Payer: Medicaid Other | Attending: Obstetrics and Gynecology | Admitting: Obstetrics and Gynecology

## 2019-09-06 DIAGNOSIS — O1002 Pre-existing essential hypertension complicating childbirth: Secondary | ICD-10-CM | POA: Diagnosis present

## 2019-09-06 DIAGNOSIS — Z789 Other specified health status: Secondary | ICD-10-CM | POA: Diagnosis present

## 2019-09-06 DIAGNOSIS — O42919 Preterm premature rupture of membranes, unspecified as to length of time between rupture and onset of labor, unspecified trimester: Secondary | ICD-10-CM

## 2019-09-06 DIAGNOSIS — O0990 Supervision of high risk pregnancy, unspecified, unspecified trimester: Secondary | ICD-10-CM

## 2019-09-06 DIAGNOSIS — Z8616 Personal history of COVID-19: Secondary | ICD-10-CM

## 2019-09-06 DIAGNOSIS — I10 Essential (primary) hypertension: Secondary | ICD-10-CM | POA: Diagnosis present

## 2019-09-06 DIAGNOSIS — O09299 Supervision of pregnancy with other poor reproductive or obstetric history, unspecified trimester: Secondary | ICD-10-CM

## 2019-09-06 DIAGNOSIS — O42113 Preterm premature rupture of membranes, onset of labor more than 24 hours following rupture, third trimester: Principal | ICD-10-CM | POA: Diagnosis present

## 2019-09-06 DIAGNOSIS — Z758 Other problems related to medical facilities and other health care: Secondary | ICD-10-CM | POA: Diagnosis present

## 2019-09-06 DIAGNOSIS — Z3A28 28 weeks gestation of pregnancy: Secondary | ICD-10-CM

## 2019-09-06 DIAGNOSIS — O24425 Gestational diabetes mellitus in childbirth, controlled by oral hypoglycemic drugs: Secondary | ICD-10-CM | POA: Diagnosis present

## 2019-09-06 DIAGNOSIS — Z603 Acculturation difficulty: Secondary | ICD-10-CM | POA: Diagnosis present

## 2019-09-06 DIAGNOSIS — O99214 Obesity complicating childbirth: Secondary | ICD-10-CM | POA: Diagnosis present

## 2019-09-06 LAB — CBC
HCT: 35.3 % — ABNORMAL LOW (ref 36.0–46.0)
Hemoglobin: 11.2 g/dL — ABNORMAL LOW (ref 12.0–15.0)
MCH: 27.5 pg (ref 26.0–34.0)
MCHC: 31.7 g/dL (ref 30.0–36.0)
MCV: 86.7 fL (ref 80.0–100.0)
Platelets: 230 10*3/uL (ref 150–400)
RBC: 4.07 MIL/uL (ref 3.87–5.11)
RDW: 14.1 % (ref 11.5–15.5)
WBC: 8.4 10*3/uL (ref 4.0–10.5)
nRBC: 0 % (ref 0.0–0.2)

## 2019-09-06 LAB — TYPE AND SCREEN
ABO/RH(D): O POS
Antibody Screen: NEGATIVE

## 2019-09-06 LAB — HEMOGLOBIN A1C
Hgb A1c MFr Bld: 5.6 % (ref 4.8–5.6)
Mean Plasma Glucose: 114.02 mg/dL

## 2019-09-06 LAB — GLUCOSE, CAPILLARY
Glucose-Capillary: 122 mg/dL — ABNORMAL HIGH (ref 70–99)
Glucose-Capillary: 180 mg/dL — ABNORMAL HIGH (ref 70–99)
Glucose-Capillary: 189 mg/dL — ABNORMAL HIGH (ref 70–99)
Glucose-Capillary: 135 mg/dL — ABNORMAL HIGH (ref 70–99)

## 2019-09-06 LAB — POCT FERN TEST: POCT Fern Test: POSITIVE

## 2019-09-06 LAB — ABO/RH: ABO/RH(D): O POS

## 2019-09-06 MED ORDER — MAGNESIUM SULFATE 40 GM/1000ML IV SOLN
2.0000 g/h | INTRAVENOUS | Status: AC
  Administered 2019-09-07: 2 g/h via INTRAVENOUS
  Filled 2019-09-06 (×2): qty 1000

## 2019-09-06 MED ORDER — ASPIRIN 81 MG PO CHEW
81.0000 mg | CHEWABLE_TABLET | Freq: Every day | ORAL | Status: DC
  Administered 2019-09-06 – 2019-09-18 (×13): 81 mg via ORAL
  Filled 2019-09-06 (×14): qty 1

## 2019-09-06 MED ORDER — ERYTHROMYCIN BASE 250 MG PO TABS: 250.0000 mg | ORAL_TABLET | Freq: Three times a day (TID) | ORAL | Status: DC

## 2019-09-06 MED ORDER — MAGNESIUM SULFATE 40 GM/1000ML IV SOLN: 2.0000 g/h | INTRAVENOUS | Status: DC

## 2019-09-06 MED ORDER — ACETAMINOPHEN 325 MG PO TABS
650.0000 mg | ORAL_TABLET | Freq: Four times a day (QID) | ORAL | Status: DC | PRN
  Administered 2019-09-06 – 2019-09-07 (×3): 650 mg via ORAL
  Filled 2019-09-06 (×3): qty 2

## 2019-09-06 MED ORDER — SODIUM CHLORIDE 0.9 % IV SOLN
2.0000 g | Freq: Four times a day (QID) | INTRAVENOUS | Status: AC
  Administered 2019-09-06 – 2019-09-07 (×8): 2 g via INTRAVENOUS
  Filled 2019-09-06 (×11): qty 2000

## 2019-09-06 MED ORDER — AMOXICILLIN 500 MG PO CAPS
500.0000 mg | ORAL_CAPSULE | Freq: Three times a day (TID) | ORAL | Status: AC
  Administered 2019-09-08 – 2019-09-12 (×15): 500 mg via ORAL
  Filled 2019-09-06 (×15): qty 1

## 2019-09-06 MED ORDER — BUTALBITAL-APAP-CAFFEINE 50-325-40 MG PO TABS
1.0000 | ORAL_TABLET | Freq: Once | ORAL | Status: AC
  Administered 2019-09-06: 1 via ORAL
  Filled 2019-09-06: qty 1

## 2019-09-06 MED ORDER — MAGNESIUM SULFATE BOLUS VIA INFUSION
4.0000 g | Freq: Once | INTRAVENOUS | Status: AC
  Administered 2019-09-06: 4 g via INTRAVENOUS
  Filled 2019-09-06: qty 1000

## 2019-09-06 MED ORDER — SODIUM CHLORIDE 0.9 % IV SOLN
250.0000 mg | Freq: Four times a day (QID) | INTRAVENOUS | Status: DC
  Administered 2019-09-06 – 2019-09-07 (×5): 250 mg via INTRAVENOUS
  Filled 2019-09-06 (×9): qty 5

## 2019-09-06 MED ORDER — BETAMETHASONE SOD PHOS & ACET 6 (3-3) MG/ML IJ SUSP
12.0000 mg | INTRAMUSCULAR | Status: AC
  Administered 2019-09-06 – 2019-09-07 (×2): 12 mg via INTRAMUSCULAR
  Filled 2019-09-06: qty 5

## 2019-09-06 MED ORDER — LACTATED RINGERS IV SOLN: INTRAVENOUS | Status: DC

## 2019-09-06 MED ORDER — INSULIN ASPART 100 UNIT/ML ~~LOC~~ SOLN
0.0000 [IU] | Freq: Three times a day (TID) | SUBCUTANEOUS | Status: DC
  Administered 2019-09-06 – 2019-09-07 (×2): 3 [IU] via SUBCUTANEOUS
  Administered 2019-09-07 (×2): 4 [IU] via SUBCUTANEOUS
  Administered 2019-09-08: 3 [IU] via SUBCUTANEOUS
  Administered 2019-09-08 – 2019-09-10 (×4): 2 [IU] via SUBCUTANEOUS
  Administered 2019-09-10 – 2019-09-11 (×3): 3 [IU] via SUBCUTANEOUS
  Administered 2019-09-11 – 2019-09-12 (×4): 2 [IU] via SUBCUTANEOUS
  Administered 2019-09-13: 4 [IU] via SUBCUTANEOUS
  Administered 2019-09-14: 16:00:00 2 [IU] via SUBCUTANEOUS
  Administered 2019-09-14 – 2019-09-15 (×2): 3 [IU] via SUBCUTANEOUS
  Administered 2019-09-15: 4 [IU] via SUBCUTANEOUS
  Administered 2019-09-16: 15:00:00 2 [IU] via SUBCUTANEOUS
  Administered 2019-09-16: 4 [IU] via SUBCUTANEOUS
  Administered 2019-09-17 (×2): 3 [IU] via SUBCUTANEOUS
  Administered 2019-09-18 (×2): 2 [IU] via SUBCUTANEOUS
  Administered 2019-09-18: 3 [IU] via SUBCUTANEOUS
  Administered 2019-09-19: 8 [IU] via SUBCUTANEOUS
  Administered 2019-09-20: 3 [IU] via SUBCUTANEOUS
  Administered 2019-09-20: 2 [IU] via SUBCUTANEOUS
  Administered 2019-09-20 (×2): 4 [IU] via SUBCUTANEOUS

## 2019-09-06 NOTE — MAU Note (Signed)
Pt report to MAU for possible ROM last night at 1100, pt states she was laying in bed and felt water come out. Pt states she has had some water continue to come out but its "a little bit", denies contraction and VB, endorsees movement.

## 2019-09-06 NOTE — Progress Notes (Deleted)
Cardiology Office Note    Date:  09/06/2019   ID:  Monica Wolfe, DOB 1986/01/13, MRN [id number]  PCP:  Department, [facility]  Cardiologist: No primary care provider on file. EPS: None  No chief complaint on file.   History of Present Illness:  Monica Wolfe is a 34 y.o. female ***    Past Medical History:  Diagnosis Date  . Chronic hypertension 05/10/2019  . Gestational diabetes mellitus     Past Surgical History:  Procedure Laterality Date  . LITHOTRIPSY  10/17/2013    Current Medications: Current Facility-Administered Medications for the 09/14/19 encounter (Appointment) with Dyann Kief, PA-C  Medication  . aspirin chewable tablet 81 mg   No outpatient medications have been marked as taking for the 09/14/19 encounter (Appointment) with Dyann Kief, PA-C.     Allergies:   Patient has no known allergies.   Social History   Socioeconomic History  . Marital status: Single    Spouse name: Not on file  . Number of children: Not on file  . Years of education: Not on file  . Highest education level: Not on file  Occupational History  . Not on file  Tobacco Use  . Smoking status: Never Smoker  . Smokeless tobacco: Never Used  Substance and Sexual Activity  . Alcohol use: No  . Drug use: No  . Sexual activity: Yes  Other Topics Concern  . Not on file  Social History Narrative  . Not on file   Social Determinants of Health   Financial Resource Strain:   . Difficulty of Paying Living Expenses:   Food Insecurity:   . Worried About Programme researcher, broadcasting/film/video in the Last Year:   . Barista in the Last Year:   Transportation Needs:   . Freight forwarder (Medical):   Marland Kitchen Lack of Transportation (Non-Medical):   Physical Activity:   . Days of Exercise per Week:   . Minutes of Exercise per Session:   Stress:   . Feeling of Stress :   Social Connections:   . Frequency of Communication with Friends and Family:   .  Frequency of Social Gatherings with Friends and Family:   . Attends Religious Services:   . Active Member of Clubs or Organizations:   . Attends Banker Meetings:   Marland Kitchen Marital Status:      Family History:  The patient's ***family history includes CAD in her father; Diabetes in her maternal grandmother.   ROS:   Please see the history of present illness.    ROS All other systems reviewed and are negative.   PHYSICAL EXAM:   VS:  LMP 01/17/2019   Physical Exam  GEN: Well nourished, well developed, in no acute distress  HEENT: normal  Neck: no JVD, carotid bruits, or masses Cardiac:RRR; no murmurs, rubs, or gallops  Respiratory:  clear to auscultation bilaterally, normal work of breathing GI: soft, nontender, nondistended, + BS Ext: without cyanosis, clubbing, or edema, Good distal pulses bilaterally MS: no deformity or atrophy  Skin: warm and dry, no rash Neuro:  Alert and Oriented x 3, Strength and sensation are intact Psych: euthymic mood, full affect  Wt Readings from Last 3 Encounters:  09/06/19 227 lb (103 kg)  08/29/19 229 lb (103.9 kg)  08/08/19 228 lb (103.4 kg)      Studies/Labs Reviewed:   EKG:  EKG is*** ordered today.  The ekg ordered today demonstrates ***  Recent  Labs: 05/10/2019: ALT 10; BUN 7; Creatinine, Ser 0.50; Potassium 4.9; Sodium 135 09/06/2019: Hemoglobin 11.2; Platelets 230   Lipid Panel No results found for: CHOL, TRIG, HDL, CHOLHDL, VLDL, LDLCALC, LDLDIRECT  Additional studies/ records that were reviewed today include:  ***    ASSESSMENT:    No diagnosis found.   PLAN:  In order of problems listed above:      Medication Adjustments/Labs and Tests Ordered: Current medicines are reviewed at length with the patient today.  Concerns regarding medicines are outlined above.  Medication changes, Labs and Tests ordered today are listed in the Patient Instructions below. There are no Patient Instructions on file for this  visit.   Sumner Boast, PA-C  09/06/2019 3:07 PM    [facility] [address], [place], [place]  [address] Phone: [phone]; Fax: [phone]

## 2019-09-06 NOTE — H&P (Signed)
History     CSN: [id number]  Arrival date and time: 09/06/19 8315   First Provider Initiated Contact with Patient 09/06/19 0404      Chief Complaint  Patient presents with  . Rupture of Membranes   Ms.  Monica Wolfe is a 34 y.o. year old [id number] female at [redacted]w[redacted]d weeks gestation who presents to MAU reporting she elt water coming out while lying in bed at 2300. She has continued to leak "a little bit" of fluid. She denies contractions or vaginal bleeding. She reports (+) FM.   OB History    Gravida  7   Para  4   Term  4   Preterm      AB  2   Living  4     SAB  2   TAB      Ectopic      Multiple      Live Births  4           Past Medical History:  Diagnosis Date  . Chronic hypertension 05/10/2019  . Gestational diabetes mellitus     Past Surgical History:  Procedure Laterality Date  . LITHOTRIPSY  10/17/2013    Family History  Problem Relation Age of Onset  . Diabetes Maternal Grandmother   . CAD Father     Social History   Tobacco Use  . Smoking status: Never Smoker  . Smokeless tobacco: Never Used  Substance Use Topics  . Alcohol use: No  . Drug use: No    Allergies: No Known Allergies  Facility-Administered Medications Prior to Admission  Medication Dose Route Frequency Provider Last Rate Last Admin  . aspirin chewable tablet 81 mg  81 mg Oral Daily Sloan Leiter, MD       Medications Prior to Admission  Medication Sig Dispense Refill Last Dose  . aspirin 81 MG EC tablet Take 81 mg by mouth daily. Swallow whole.   09/05/2019 at 0900  . Prenatal Vit-Fe Fumarate-FA (PRENATAL VITAMINS) 28-0.8 MG TABS Take 1 tablet by mouth daily. 30 tablet 6 09/05/2019 at [place] time    Review of Systems  Constitutional: Negative.   HENT: Negative.   Eyes: Negative.   Respiratory: Negative.   Cardiovascular: Negative.   Gastrointestinal: Negative.   Endocrine: Negative.   Genitourinary: Positive for vaginal discharge (leaking  fluid since 2300). Negative for pelvic pain and vaginal bleeding.  Musculoskeletal: Negative.   Skin: Negative.   Allergic/Immunologic: Negative.   Neurological: Negative.   Hematological: Negative.   Psychiatric/Behavioral: Negative.    Physical Exam   Blood pressure 130/85, pulse 80, temperature 97.8 F (36.6 C), temperature source Oral, resp. rate 18, last menstrual period 01/17/2019, SpO2 100 %.  Physical Exam  Nursing note and vitals reviewed. Constitutional: She is oriented to person, place, and time. She appears well-developed and well-nourished.  HENT:  Head: Normocephalic and atraumatic.  Eyes: Pupils are equal, round, and reactive to light.  Cardiovascular: Normal rate.  Respiratory: Effort normal.  GI: Soft.  Genitourinary:    Genitourinary Comments: Patient actively leaking moderate amount of clear non-malodorous fluid from introitus -- 2nd fern slide obtained by Patricia Pesa, RN -- cervical exam deferred   Musculoskeletal:        General: Normal range of motion.     Cervical back: Normal range of motion.  Neurological: She is alert and oriented to person, place, and time.  Skin: Skin is warm and dry.  Psychiatric: She has a normal mood and  affect. Her behavior is normal. Judgment and thought content normal.   NST - FHR: 140 bpm / moderate variability / accels present / decels absent / TOCO: none  MAU Course  Procedures Patient informed that the ultrasound is considered a limited OB ultrasound and is not intended to be a complete ultrasound exam.  Patient also informed that the ultrasound is not being completed with the intent of assessing for fetal or placental anomalies or any pelvic abnormalities.  Explained that the purpose of today's ultrasound is to assess for presentation.  Baby was found to be in a cephalic presentation. Patient acknowledges the purpose of the exam and the limitations of the study.   MDM Fern Slide x 2 -- 1st was negative, but 2nd was  POSITIVE GBS GC/CT -- Results pending   Results for orders placed or performed during the hospital encounter of 09/06/19 (from the past 24 hour(s))  POCT fern test     Status: Abnormal   Collection Time: 09/06/19  4:28 AM  Result Value Ref Range   POCT Fern Test Positive = ruptured amniotic membanes     *Consult with Dr. Marice Potter @ [phone] - notified of patient's complaints, assessments, lab results, tx plan admit to [facility] - recommended orders: GC/CT, GBS, Latency abx (amp & erythromycin), NST daily, regular diet, SCD hose, NICU consult  Assessment and Plan  Preterm premature rupture of membranes (PPROM) with onset of labor after 24 hours of rupture in third trimester, antepartum - Admit to OBSCU - Routine Antenatal admission orders - Ampicillin/Erythromycin abx protocol - Regular diet - BR with BRP - SCD hose - NST daily - Dr. Marice Potter assumes care at time of admission  Monica Mora, MSN, CNM 09/06/2019, 4:04 AM

## 2019-09-06 NOTE — Consult Note (Signed)
[facility] 09/06/2019    2:15 PM  Neonatal Medicine Consultation         LEEONA MCCARDLE          MRN:  [id number]  I was called at the request of the patient's obstetrician (Dr. Marice Potter) to speak to this patient due to impending premature delivery (28+ weeks).  The patient's prenatal course includes gestational hypertension, gestational diabetes (diet), COVID positive on 06/17/19.  She is 28 1/7 weeks currently.  She is admitted to [facility] for PROM late last night.  She is receiving treatment that includes latency antibiotics (ampicillin/amoxicillin and erythromycin), betamethasone (3/30, 3/31).  The baby is a female.  I reviewed expectations for a baby born at 28+ weeks, including survival, length of stay, morbidities such as respiratory distress, IVH, infection, feeding intolerance, retinopathy.  I described how we provide respiratory and feeding support.  Mom plans to breast feed so I discussed the use of donor breast milk along with the need for pumping following the delivery.  I spent 20 minutes reviewing the record, speaking to the patient, and entering appropriate documentation.  More than 50% of the time was spent face to face with patient.   _____________________ Angelita Ingles, MD Attending Neonatologist

## 2019-09-07 DIAGNOSIS — O42113 Preterm premature rupture of membranes, onset of labor more than 24 hours following rupture, third trimester: Secondary | ICD-10-CM

## 2019-09-07 HISTORY — DX: Preterm premature rupture of membranes, onset of labor more than 24 hours following rupture, third trimester: O42.113

## 2019-09-07 LAB — GLUCOSE, CAPILLARY
Glucose-Capillary: 120 mg/dL — ABNORMAL HIGH (ref 70–99)
Glucose-Capillary: 157 mg/dL — ABNORMAL HIGH (ref 70–99)
Glucose-Capillary: 188 mg/dL — ABNORMAL HIGH (ref 70–99)
Glucose-Capillary: 167 mg/dL — ABNORMAL HIGH (ref 70–99)
Glucose-Capillary: 177 mg/dL — ABNORMAL HIGH (ref 70–99)
Glucose-Capillary: 170 mg/dL — ABNORMAL HIGH (ref 70–99)

## 2019-09-07 LAB — GC/CHLAMYDIA PROBE AMP (~~LOC~~) NOT AT ARMC
Chlamydia: NEGATIVE
Comment: NEGATIVE
Comment: NORMAL
Neisseria Gonorrhea: NEGATIVE

## 2019-09-07 MED ORDER — AZITHROMYCIN 250 MG PO TABS
1000.0000 mg | ORAL_TABLET | Freq: Once | ORAL | Status: AC
  Administered 2019-09-07: 11:00:00 1000 mg via ORAL
  Filled 2019-09-07: qty 4

## 2019-09-07 NOTE — Progress Notes (Addendum)
   PRENATAL VISIT NOTE  Subjective:  Monica Wolfe is a 34 y.o. [id number] at [redacted]w[redacted]d being seen today for ongoing prenatal care.  She is currently monitored for the following issues for this high-risk pregnancy and has Bilateral knee pain; Health care maintenance; Supervision of high risk pregnancy, antepartum; Chronic hypertension; Breast lesion; H/O postpartum hemorrhage, currently pregnant; Language barrier; COVID-19; Diabetes mellitus without complication (HCC); and Preterm premature rupture of membranes (PPROM) with onset of labor after 24 hours of rupture in third trimester, antepartum on their problem list.  gDM-Diet controlled.   Patient reports no contractions, no cramping and pink tinged leaking intermittent.   . Vag. Bleeding: None.   . Denies HA, RUQ pain, vision changes, subjective fever/chills, N/V.   The following portions of the patient's history were reviewed and updated as appropriate: allergies, current medications, past family history, past medical history, past social history, past surgical history and problem list.   Objective:   Vitals:   09/07/19 0500 09/07/19 0600 09/07/19 0601 09/07/19 0825  BP:   134/80 124/79  Pulse:   88 91  Resp: 18 18 18 18   Temp:   97.7 F (36.5 C) 97.9 F (36.6 C)  TempSrc:   Oral Oral  SpO2:   100% 98%  Weight:      Height:        Fetal Status: NST BID, Pt currently on monitor, reactive and reassuring.   General:  Alert, oriented and cooperative. Patient is in no acute distress.  Skin: Skin is warm and dry. No rash noted.   Abdomen: Soft, gravid, appropriate for gestational age.        Pelvic: Cervical exam deferred        Extremities: Normal range of motion.     Mental Status: Normal mood and affect. Normal behavior. Normal judgment and thought content.   Assessment and Plan:  Pregnancy: at [redacted]w[redacted]d Admitted for PPROM on 3/30, with gDM, cHTN, continue latency Abx, s/p betamethasone. Continue SSI regarding beta effect  on gDM. Precautions reviewed.  Preterm labor symptoms and general obstetric precautions including but not limited to vaginal bleeding, contractions, leaking of fluid and fetal movement were reviewed in detail with the patient. Please refer to After Visit Summary for other counseling recommendations.   No follow-ups on file.  Future Appointments  Date Time Provider Department Center  09/14/2019 10:15 AM 11/14/2019, PA-C [facility]  09/28/2019 11:15 AM [facility] NURSE [facility]  09/28/2019 11:15 AM [facility] 09/30/2019 4 [facility]    [place], MD

## 2019-09-07 NOTE — Progress Notes (Signed)
Check on pt needs and meals, by Orlan Leavens Spanish Interpreter.

## 2019-09-08 LAB — GLUCOSE, CAPILLARY
Glucose-Capillary: 114 mg/dL — ABNORMAL HIGH (ref 70–99)
Glucose-Capillary: 108 mg/dL — ABNORMAL HIGH (ref 70–99)
Glucose-Capillary: 96 mg/dL (ref 70–99)
Glucose-Capillary: 141 mg/dL — ABNORMAL HIGH (ref 70–99)

## 2019-09-08 LAB — CULTURE, BETA STREP (GROUP B ONLY)

## 2019-09-08 MED ORDER — INSULIN NPH (HUMAN) (ISOPHANE) 100 UNIT/ML ~~LOC~~ SUSP
10.0000 [IU] | Freq: Once | SUBCUTANEOUS | Status: AC
  Administered 2019-09-08: 10 [IU] via SUBCUTANEOUS
  Filled 2019-09-08 (×2): qty 10

## 2019-09-08 MED ORDER — INSULIN NPH (HUMAN) (ISOPHANE) 100 UNIT/ML ~~LOC~~ SUSP: 10.0000 [IU] | Freq: Two times a day (BID) | SUBCUTANEOUS | Status: DC

## 2019-09-08 NOTE — Progress Notes (Signed)
   PRENATAL VISIT NOTE  Subjective:  Monica Wolfe is a 34 y.o. [id number] at [redacted]w[redacted]d being seen today for ongoing prenatal care.  She is currently monitored for the following issues for this high-risk pregnancy and has Bilateral knee pain; Health care maintenance; Supervision of high risk pregnancy, antepartum; Chronic hypertension; Breast lesion; H/O postpartum hemorrhage, currently pregnant; Language barrier; COVID-19; Diabetes mellitus without complication (HCC); and Preterm premature rupture of membranes (PPROM) with onset of labor after 24 hours of rupture in third trimester, antepartum on their problem list.  gDM-Diet controlled.   Patient reports no contractions, no cramping and no leaking.   . Vag. Bleeding: None(none reported by patient).   . Denies HA, RUQ pain, vision changes, subjective fever/chills, N/V.   The following portions of the patient's history were reviewed and updated as appropriate: allergies, current medications, past family history, past medical history, past social history, past surgical history and problem list.   Objective:   Vitals:   09/07/19 1923 09/08/19 0001 09/08/19 0616 09/08/19 0739  BP: 128/78 129/75 128/75 121/73  Pulse: 86 86 70 69  Resp: 18 18 18 18   Temp: 98.4 F (36.9 C) 97.6 F (36.4 C) 97.8 F (36.6 C) 98.5 F (36.9 C)  TempSrc: Oral Oral Oral Oral  SpO2: 100% 95% 100% 95%  Weight:      Height:        Fetal Status: NST BID, Pt currently on monitor, reactive and reassuring.   General:  Alert, oriented and cooperative. Patient is in no acute distress.  Skin: Skin is warm and dry. No rash noted.   Abdomen: Soft, gravid, appropriate for gestational age.        Pelvic: Cervical exam deferred        Extremities: Normal range of motion.     Mental Status: Normal mood and affect. Normal behavior. Normal judgment and thought content.   Assessment and Plan:  Pregnancy: at [redacted]w[redacted]d Admitted for PPROM on 3/30, with gDM, cHTN,  continue latency Abx, s/p betamethasone. Discussed with DM coordinator, will add NPH 10qAM and 10uqhs, due to elevated fasting 110s and postprandials 160-180s.  Preterm labor symptoms and general obstetric precautions including but not limited to vaginal bleeding, contractions, leaking of fluid and fetal movement were reviewed in detail with the patient. Please refer to After Visit Summary for other counseling recommendations.   No follow-ups on file.  Future Appointments  Date Time Provider Department Center  09/14/2019 10:15 AM 11/14/2019, PA-C [facility]  09/28/2019 11:15 AM [facility] NURSE [facility]  09/28/2019 11:15 AM [facility] 09/30/2019 4 [facility]    [place], MD

## 2019-09-08 NOTE — Progress Notes (Addendum)
Inpatient Diabetes Program Recommendations  Diabetes Treatment Program Recommendations  ADA Standards of Care 2018 Diabetes in Pregnancy Target Glucose Ranges:  Fasting: 60 - 90 mg/dL Preprandial: 60 - 099 mg/dL 1 hr postprandial: Less than 140mg /dL (from first bite of meal) 2 hr postprandial: Less than 120 mg/dL (from first bite of meal)    Lab Results  Component Value Date   GLUCAP 170 (H) 09/07/2019   HGBA1C 5.6 09/06/2019    Review of Glycemic Control Results for Monica Wolfe, Monica Wolfe (MRN Monica Wolfe) as of 09/08/2019 09:20  Ref. Range 09/07/2019 14:52 09/07/2019 17:39 09/07/2019 20:16 09/07/2019 22:03  Glucose-Capillary Latest Ref Range: 70 - 99 mg/dL 09/09/2019 (H) 976 (H) 734 (H) 170 (H)   Diabetes history: GDM Outpatient Diabetes medications: none Current orders for Inpatient glycemic control: Novolog 0-16 units TID BMZ x2  Inpatient Diabetes Program Recommendations:    In the setting of steroids, glucose trends exceed inpatient goals for pregnancy. Anticipate trends to improve over next 24 hours. However, with elevations would recommend adding NPH 10 units BID for today, then reassessing insulin needs in AM.   Thanks, 193, MSN, RNC-OB Diabetes Coordinator [phone] (8a-5p)

## 2019-09-08 NOTE — Progress Notes (Signed)
CBGs not transferring Fasting 114 2 hour PP 108

## 2019-09-09 DIAGNOSIS — Z3A28 28 weeks gestation of pregnancy: Secondary | ICD-10-CM

## 2019-09-09 LAB — TYPE AND SCREEN
ABO/RH(D): O POS
Antibody Screen: NEGATIVE

## 2019-09-09 LAB — GLUCOSE, CAPILLARY
Glucose-Capillary: 78 mg/dL (ref 70–99)
Glucose-Capillary: 97 mg/dL (ref 70–99)
Glucose-Capillary: 85 mg/dL (ref 70–99)
Glucose-Capillary: 84 mg/dL (ref 70–99)
Glucose-Capillary: 84 mg/dL (ref 70–99)

## 2019-09-09 NOTE — Progress Notes (Signed)
FACULTY PRACTICE ANTEPARTUM(COMPREHENSIVE) NOTE  Monica Wolfe is a 34 y.o. [id number] at [redacted]w[redacted]d by best clinical estimate who is admitted for PROM.   Fetal presentation is cephalic. Length of Stay:  0  Days  ASSESSMENT: Active Problems:   Preterm premature rupture of membranes (PPROM) with onset of labor after 24 hours of rupture in third trimester, antepartum   PLAN: Continue inpatient monitoring Complete Abx Delivery with signs/symptoms of infection Lengthy discussion around PPROM, benefits and risks of delivery  Subjective: Feels well Patient reports the fetal movement as active. Patient reports uterine contraction  activity as none. Patient reports  vaginal bleeding as scant staining. Patient describes fluid per vagina as Other pink-tinged, unchanged.  Vitals:  Blood pressure (!) 106/57, pulse 84, temperature 98.3 F (36.8 C), temperature source Oral, resp. rate 18, height 5\' 6"  (1.676 m), weight 103 kg, last menstrual period 01/17/2019, SpO2 95 %. Physical Examination:  General appearance - alert, well appearing, and in no distress Chest - normal effort Abdomen - gravid, non-tender Fundal Height:  size equals dates Extremities: Homans sign is negative, no sign of DVT  Membranes:intact  Fetal Monitoring:  Baseline: 145 bpm, Variability: Good {> 6 bpm), Accelerations: Reactive and Decelerations: Absent  Labs:  Results for orders placed or performed during the hospital encounter of 09/06/19 (from the past 24 hour(s))  Glucose, capillary   Collection Time: 09/08/19  4:13 PM  Result Value Ref Range   Glucose-Capillary 96 70 - 99 mg/dL  Glucose, capillary   Collection Time: 09/08/19 10:02 PM  Result Value Ref Range   Glucose-Capillary 141 (H) 70 - 99 mg/dL  Glucose, capillary   Collection Time: 09/09/19  8:28 AM  Result Value Ref Range   Glucose-Capillary 78 70 - 99 mg/dL  Glucose, capillary   Collection Time: 09/09/19 10:35 AM  Result Value Ref Range   Glucose-Capillary 97 70 - 99 mg/dL    Medications:  Scheduled . amoxicillin  500 mg Oral Q8H  . aspirin  81 mg Oral Daily  . insulin aspart  0-16 Units Subcutaneous TID PC   I have reviewed the patient's current medications.   11/09/19, MD 09/09/2019,12:48 PM

## 2019-09-09 NOTE — Progress Notes (Signed)
Ordered pt meals, check for her needs, by Orlan Leavens Spanish Interpreter.

## 2019-09-10 DIAGNOSIS — E119 Type 2 diabetes mellitus without complications: Secondary | ICD-10-CM

## 2019-09-10 DIAGNOSIS — O10913 Unspecified pre-existing hypertension complicating pregnancy, third trimester: Secondary | ICD-10-CM

## 2019-09-10 DIAGNOSIS — O42113 Preterm premature rupture of membranes, onset of labor more than 24 hours following rupture, third trimester: Principal | ICD-10-CM

## 2019-09-10 DIAGNOSIS — O24313 Unspecified pre-existing diabetes mellitus in pregnancy, third trimester: Secondary | ICD-10-CM

## 2019-09-10 DIAGNOSIS — Z3A29 29 weeks gestation of pregnancy: Secondary | ICD-10-CM

## 2019-09-10 DIAGNOSIS — Z603 Acculturation difficulty: Secondary | ICD-10-CM

## 2019-09-10 DIAGNOSIS — Z8616 Personal history of COVID-19: Secondary | ICD-10-CM

## 2019-09-10 LAB — GLUCOSE, CAPILLARY
Glucose-Capillary: 105 mg/dL — ABNORMAL HIGH (ref 70–99)
Glucose-Capillary: 122 mg/dL — ABNORMAL HIGH (ref 70–99)
Glucose-Capillary: 133 mg/dL — ABNORMAL HIGH (ref 70–99)
Glucose-Capillary: 75 mg/dL (ref 70–99)
Glucose-Capillary: 85 mg/dL (ref 70–99)

## 2019-09-10 NOTE — Progress Notes (Signed)
Patient ID: Monica Wolfe, female   DOB: 1986/02/22, 34 y.o.   MRN: [id number] [place]) NOTE  Monica Wolfe is a 34 y.o. [id number] with Estimated Date of Delivery: 11/28/19   By  early ultrasound [redacted]w[redacted]d  who is admitted for PROM.    Fetal presentation is cephalic. Length of Stay:  0  Days  Date of admission:09/06/2019  Subjective:  Patient reports the fetal movement as active. Patient reports uterine contraction  activity as none. Patient reports  vaginal bleeding as none. Patient describes fluid per vagina as Clear.  Vitals:  Blood pressure (!) 104/54, pulse 68, temperature 98.2 F (36.8 C), temperature source Oral, resp. rate 18, height 5\' 6"  (1.676 m), weight 103 kg, last menstrual period 01/17/2019, SpO2 100 %. Vitals:   09/09/19 1611 09/09/19 1944 09/09/19 2308 09/10/19 0617  BP: 120/72 (!) 143/90 119/69 (!) 104/54  Pulse: 85 77 70 68  Resp: 20 18 18 18   Temp: 98.8 F (37.1 C) 97.6 F (36.4 C) 97.8 F (36.6 C) 98.2 F (36.8 C)  TempSrc: Oral Oral Oral Oral  SpO2: 96% 100% 98% 100%  Weight:      Height:       Physical Examination:  General appearance - alert, well appearing, and in no distress Abdomen - soft, nontender, nondistended, no masses or organomegaly Fundal Height:  size equals dates Pelvic Exam:  examination not indicated Cervical Exam: Not evaluated.Extremities: extremities normal, atraumatic, no cyanosis or edema with DTRs 2+ bilaterally Membranes:ruptured, clear fluid  Fetal Monitoring:  Baseline: 135 bpm, Variability: Good {> 6 bpm), Accelerations: Reactive and Decelerations: Absent   reactive  Labs:  Results for orders placed or performed during the hospital encounter of 09/06/19 (from the past 24 hour(s))  Glucose, capillary   Collection Time: 09/09/19  8:28 AM  Result Value Ref Range   Glucose-Capillary 78 70 - 99 mg/dL  Glucose, capillary   Collection Time: 09/09/19 10:35 AM  Result Value Ref  Range   Glucose-Capillary 97 70 - 99 mg/dL  Glucose, capillary   Collection Time: 09/09/19  2:31 PM  Result Value Ref Range   Glucose-Capillary 85 70 - 99 mg/dL  Type and screen [place]   Collection Time: 09/09/19  8:30 PM  Result Value Ref Range   ABO/RH(D) O POS    Antibody Screen NEG    Sample Expiration      09/12/2019,2359 Performed at [facility], [place] [address] [place], [place] [address]   Glucose, capillary   Collection Time: 09/09/19  8:43 PM  Result Value Ref Range   Glucose-Capillary 84 70 - 99 mg/dL  Glucose, capillary   Collection Time: 09/09/19 11:15 PM  Result Value Ref Range   Glucose-Capillary 84 70 - 99 mg/dL  Glucose, capillary   Collection Time: 09/10/19  6:20 AM  Result Value Ref Range   Glucose-Capillary 75 70 - 99 mg/dL    Imaging Studies:    No results found.   Medications:  Scheduled . amoxicillin  500 mg Oral Q8H  . aspirin  81 mg Oral Daily  . insulin aspart  0-16 Units Subcutaneous TID PC   I have reviewed the patient's current medications.  ASSESSMENT: [id number] [redacted]w[redacted]d Estimated Date of Delivery: 11/28/19  Patient Active Problem List   Diagnosis Date Noted  . Preterm premature rupture of membranes (PPROM) with onset of labor after 24 hours of rupture in third trimester, antepartum 09/07/2019  . Diabetes mellitus without complication ([place])   .  COVID-19 06/18/2019  . Supervision of high risk pregnancy, antepartum 05/10/2019  . Chronic hypertension 05/10/2019  . Breast lesion 05/10/2019  . H/O postpartum hemorrhage, currently pregnant 05/10/2019  . Language barrier 05/10/2019  . Bilateral knee pain 07/19/2015  . Health care maintenance 07/19/2015    PLAN: >completed BMZ course >continue latency antibiotics >If labors expectant management >dewlivery 34 weeks if undelivered or as otherwise indicated >CBG normalizing after the BMZ >BP stable   Shellby Schlink H Ival [employer] 09/10/2019,7:55 AM

## 2019-09-11 LAB — GLUCOSE, CAPILLARY
Glucose-Capillary: 106 mg/dL — ABNORMAL HIGH (ref 70–99)
Glucose-Capillary: 111 mg/dL — ABNORMAL HIGH (ref 70–99)
Glucose-Capillary: 128 mg/dL — ABNORMAL HIGH (ref 70–99)
Glucose-Capillary: 145 mg/dL — ABNORMAL HIGH (ref 70–99)
Glucose-Capillary: 148 mg/dL — ABNORMAL HIGH (ref 70–99)

## 2019-09-11 NOTE — Progress Notes (Signed)
Dr. Despina Hidden notified of two consecutive CBG >120. Two hr PP CBG @2000  of 145 treated w/ 3 units of NovoLOG insulin and Bedtime CBG @2200  of 148. No new orders at this time.

## 2019-09-11 NOTE — Progress Notes (Signed)
At 0630 this RN observed a small clot in the patients pink tinged urine along with a small amount of dark pink and light red tinged discharge on a peri pad. The patient has described small amounts of pink tinged discharged without clots up till this point. Dr Despina Hidden notified @0648  of these findings. No new orders at this time.

## 2019-09-11 NOTE — Progress Notes (Signed)
Patient ID: Monica Wolfe, female   DOB: 1985-12-31, 34 y.o.   MRN: [id number] FACULTY PRACTICE ANTEPARTUM(COMPREHENSIVE) NOTE  Monica Wolfe is a 34 y.o. [id number] at [redacted]w[redacted]d by best clinical estimate who is admitted for PROM.   Fetal presentation is cephalic. Length of Stay:  0  Days  ASSESSMENT: Active Problems:   Preterm premature rupture of membranes (PPROM) with onset of labor after 24 hours of rupture in third trimester, antepartum   PLAN: Completing Abx. S/p BMZ Worsening bleeding overnight, now resolved. No signs of overt infection right now Delivery with worsening maternal/fetal status. CBGs ok.  Subjective: Had some brighter bleeding overnight on pad and in toilet. Now back to just spotting. Patient reports the fetal movement as active. Patient reports uterine contraction  activity as none. Patient reports  vaginal bleeding as scant staining. Patient describes fluid per vagina as Other pink tinged.  Vitals:  Blood pressure 120/77, pulse 73, temperature 98.1 F (36.7 C), temperature source Oral, resp. rate 18, height 5\' 6"  (1.676 m), weight 103 kg, last menstrual period 01/17/2019, SpO2 100 %. Physical Examination:  General appearance - alert, well appearing, and in no distress Chest - normal effort Abdomen - gravid, non-tender Fundal Height:  size equals dates Extremities: Homans sign is negative, no sign of DVT  Membranes:Ruptured  Fetal Monitoring:  Baseline: 145 bpm, Variability: Good {> 6 bpm), Accelerations: Reactive and Decelerations: Absent  Labs:  Results for orders placed or performed during the hospital encounter of 09/06/19 (from the past 24 hour(s))  Glucose, capillary   Collection Time: 09/10/19  2:38 PM  Result Value Ref Range   Glucose-Capillary 133 (H) 70 - 99 mg/dL  Glucose, capillary   Collection Time: 09/10/19  9:31 PM  Result Value Ref Range   Glucose-Capillary 105 (H) 70 - 99 mg/dL  Glucose, capillary   Collection Time:  09/10/19 11:35 PM  Result Value Ref Range   Glucose-Capillary 122 (H) 70 - 99 mg/dL  Glucose, capillary   Collection Time: 09/11/19  6:29 AM  Result Value Ref Range   Glucose-Capillary 106 (H) 70 - 99 mg/dL  Glucose, capillary   Collection Time: 09/11/19 11:23 AM  Result Value Ref Range   Glucose-Capillary 111 (H) 70 - 99 mg/dL     Medications:  Scheduled . amoxicillin  500 mg Oral Q8H  . aspirin  81 mg Oral Daily  . insulin aspart  0-16 Units Subcutaneous TID PC   I have reviewed the patient's current medications.   11/11/19, MD 09/11/2019,1:54 PM

## 2019-09-12 ENCOUNTER — Encounter: Payer: Self-pay | Admitting: Obstetrics and Gynecology

## 2019-09-12 LAB — GLUCOSE, CAPILLARY
Glucose-Capillary: 100 mg/dL — ABNORMAL HIGH (ref 70–99)
Glucose-Capillary: 106 mg/dL — ABNORMAL HIGH (ref 70–99)
Glucose-Capillary: 111 mg/dL — ABNORMAL HIGH (ref 70–99)
Glucose-Capillary: 108 mg/dL — ABNORMAL HIGH (ref 70–99)
Glucose-Capillary: 141 mg/dL — ABNORMAL HIGH (ref 70–99)

## 2019-09-12 MED ORDER — METFORMIN HCL 500 MG PO TABS: 500.0000 mg | ORAL_TABLET | Freq: Two times a day (BID) | ORAL | Status: DC

## 2019-09-12 MED ORDER — PRENATAL MULTIVITAMIN CH
1.0000 | ORAL_TABLET | Freq: Every morning | ORAL | Status: DC
  Administered 2019-09-12 – 2019-09-18 (×7): 1 via ORAL
  Filled 2019-09-12 (×7): qty 1

## 2019-09-12 NOTE — Progress Notes (Signed)
Patient ID: Monica Wolfe, female   DOB: February 15, 1986, 34 y.o.   MRN: [id number] FACULTY PRACTICE ANTEPARTUM(COMPREHENSIVE) NOTE  Monica Wolfe is a 34 y.o. U2P5361 with Estimated Date of Delivery: 11/28/19   By   [redacted]w[redacted]d  who is admitted for PROM.  On 09/06/19  Fetal presentation is cephalic. Length of Stay:  0  Days  Date of admission:09/06/2019  Subjective:  Patient reports the fetal movement as active. Patient reports uterine contraction  activity as none. Patient reports  vaginal bleeding as scant staining. Patient describes fluid per vagina as None.  Vitals:  Blood pressure 119/79, pulse 76, temperature 97.6 F (36.4 C), temperature source Oral, resp. rate 18, height 5\' 6"  (1.676 m), weight 103 kg, last menstrual period 01/17/2019, SpO2 100 %. Vitals:   09/11/19 1508 09/11/19 2004 09/11/19 2202 09/12/19 0613  BP: 122/72 124/73 115/75 119/79  Pulse: 83 77 79 76  Resp: 18 18 17 18   Temp: (!) 97.3 F (36.3 C) 97.8 F (36.6 C) 97.9 F (36.6 C) 97.6 F (36.4 C)  TempSrc: Oral Oral Oral Oral  SpO2: 100% 100% 100% 100%  Weight:      Height:       Physical Examination:  General appearance - alert, well appearing, and in no distress Abdomen - soft, nontender, nondistended, no masses or organomegaly Fundal Height:  size equals dates Pelvic Exam:  examination not indicated Cervical Exam: Not evaluated.  Extremities: extremities normal, atraumatic, no cyanosis or edema with DTRs 2+ bilaterally Membranes:ruptured, clear fluid  Fetal Monitoring:  Baseline: 135 bpm, Variability: Good {> 6 bpm), Accelerations: Reactive and Decelerations: Absent   reactive  Labs:  Results for orders placed or performed during the hospital encounter of 09/06/19 (from the past 24 hour(s))  Glucose, capillary   Collection Time: 09/11/19 11:23 AM  Result Value Ref Range   Glucose-Capillary 111 (H) 70 - 99 mg/dL  Glucose, capillary   Collection Time: 09/11/19  2:26 PM  Result Value Ref  Range   Glucose-Capillary 128 (H) 70 - 99 mg/dL  Glucose, capillary   Collection Time: 09/11/19  8:02 PM  Result Value Ref Range   Glucose-Capillary 145 (H) 70 - 99 mg/dL  Glucose, capillary   Collection Time: 09/11/19 10:00 PM  Result Value Ref Range   Glucose-Capillary 148 (H) 70 - 99 mg/dL  Glucose, capillary   Collection Time: 09/12/19  6:09 AM  Result Value Ref Range   Glucose-Capillary 100 (H) 70 - 99 mg/dL    Imaging Studies:    No results found.   Medications:  Scheduled . amoxicillin  500 mg Oral Q8H  . aspirin  81 mg Oral Daily  . insulin aspart  0-16 Units Subcutaneous TID PC  . metFORMIN  500 mg Oral BID WC   I have reviewed the patient's current medications.  ASSESSMENT: 11/11/19 [redacted]w[redacted]d Estimated Date of Delivery: 11/28/19  PPROM 09/06/19  Patient Active Problem List   Diagnosis Date Noted  . Preterm premature rupture of membranes (PPROM) with onset of labor after 24 hours of rupture in third trimester, antepartum 09/07/2019  . Diabetes mellitus without complication (HCC)   . COVID-19 06/18/2019  . Supervision of high risk pregnancy, antepartum 05/10/2019  . Chronic hypertension 05/10/2019  . Breast lesion 05/10/2019  . H/O postpartum hemorrhage, currently pregnant 05/10/2019  . Language barrier 05/10/2019  . Bilateral knee pain 07/19/2015  . Health care maintenance 07/19/2015    PLAN: >finishing up latency antibiotics >S/P BMZ x 2\ >S/P Magnesium prophylaxis 09/06/19 >  CBG a bit elevated due to BMZ hopefully will normalize in the coming days as BMZ effects wane, for now use sliding scale for corrections, consider metformin if her CBG remains elevated later this week  Continue in house observation, allow to labor spontaneously, or as clinically indicated, with delivery planned at 34 weeks if undelivered or  [place] 09/12/2019,7:36 AM

## 2019-09-13 LAB — GLUCOSE, CAPILLARY
Glucose-Capillary: 80 mg/dL (ref 70–99)
Glucose-Capillary: 80 mg/dL (ref 70–99)
Glucose-Capillary: 111 mg/dL — ABNORMAL HIGH (ref 70–99)
Glucose-Capillary: 184 mg/dL — ABNORMAL HIGH (ref 70–99)

## 2019-09-13 LAB — TYPE AND SCREEN
ABO/RH(D): O POS
Antibody Screen: NEGATIVE

## 2019-09-13 NOTE — Progress Notes (Signed)
Patient ID: Monica Wolfe, female   DOB: 09/20/1985, 34 y.o.   MRN: [id number]   [place]) NOTE  Monica Wolfe is a 34 y.o. [id number] with Estimated Date of Delivery: 11/28/19   By   [redacted]w[redacted]d  who is admitted for PROM.    Fetal presentation is cephalic. Length of Stay:  0  Days  Date of admission:09/06/2019  Subjective:  Patient reports the fetal movement as active. Patient reports uterine contraction  activity as none. Patient reports  vaginal bleeding as none. Patient describes fluid per vagina as None.  Vitals:  Blood pressure 111/67, pulse 68, temperature 98 F (36.7 C), temperature source Oral, resp. rate 18, height 5\' 6"  (1.676 m), weight 103 kg, last menstrual period 01/17/2019, SpO2 96 %. Vitals:   09/12/19 1555 09/12/19 1941 09/12/19 2306 09/13/19 0634  BP: 108/64 131/74 112/60 111/67  Pulse: 83 87 75 68  Resp: 18 20 18 18   Temp: 97.9 F (36.6 C) 98.5 F (36.9 C) 98.2 F (36.8 C) 98 F (36.7 C)  TempSrc: Oral  Oral Oral  SpO2: 97% 99% 96% 96%  Weight:      Height:       Physical Examination:  General appearance - alert, well appearing, and in no distress Abdomen - soft, nontender, nondistended, no masses or organomegaly Fundal Height:  size equals dates Pelvic Exam:  examination not indicated Cervical Exam: Not evaluated. Extremities: extremities normal, atraumatic, no cyanosis or edema with DTRs 2+ bilaterally Membranes:ruptured, clear fluid  Fetal Monitoring:  Baseline: 140 bpm, Variability: Good {> 6 bpm), Accelerations: Reactive and Decelerations: Absent   reactive  Labs:  Results for orders placed or performed during the hospital encounter of 09/06/19 (from the past 24 hour(s))  Glucose, capillary   Collection Time: 09/12/19 10:03 AM  Result Value Ref Range   Glucose-Capillary 106 (H) 70 - 99 mg/dL   Comment 1 Notify RN    Comment 2 Document in Chart   Glucose, capillary   Collection Time: 09/12/19  4:33  PM  Result Value Ref Range   Glucose-Capillary 111 (H) 70 - 99 mg/dL   Comment 1 Notify RN    Comment 2 Document in Chart   Type and screen [place]   Collection Time: 09/12/19  7:48 PM  Result Value Ref Range   ABO/RH(D) O POS    Antibody Screen NEG    Sample Expiration      09/15/2019,2359 Performed at [facility], [place] [address], [place], [place] [address]   Glucose, capillary   Collection Time: 09/12/19  9:03 PM  Result Value Ref Range   Glucose-Capillary 108 (H) 70 - 99 mg/dL  Glucose, capillary   Collection Time: 09/12/19 11:07 PM  Result Value Ref Range   Glucose-Capillary 141 (H) 70 - 99 mg/dL  Glucose, capillary   Collection Time: 09/13/19  6:34 AM  Result Value Ref Range   Glucose-Capillary 80 70 - 99 mg/dL    Imaging Studies:    No results found.   Medications:  Scheduled . aspirin  81 mg Oral Daily  . insulin aspart  0-16 Units Subcutaneous TID PC  . prenatal multivitamin  1 tablet Oral q morning - 10a   I have reviewed the patient's current medications.  ASSESSMENT: [id number] [redacted]w[redacted]d Estimated Date of Delivery: 11/28/19  Patient Active Problem List   Diagnosis Date Noted  . Preterm premature rupture of membranes (PPROM) with onset of labor after 24 hours of rupture in  third trimester, antepartum 09/07/2019  . Diabetes mellitus without complication (HCC)   . COVID-19 06/18/2019  . Supervision of high risk pregnancy, antepartum 05/10/2019  . Chronic hypertension 05/10/2019  . Breast lesion 05/10/2019  . H/O postpartum hemorrhage, currently pregnant 05/10/2019  . Language barrier 05/10/2019  . Bilateral knee pain 07/19/2015  . Health care maintenance 07/19/2015    PLAN: >completed latency antibiotics S/P BMZ x 2 >S/P magnesium prophylaxis  Remain in house until delivery at 34 weeks if undelivered or as clinically indicated for delivery or if pt labors allow that to happen without intervention  [employer] 09/13/2019,7:57  AM

## 2019-09-13 NOTE — Plan of Care (Signed)
  Problem: Education: Goal: Knowledge of disease or condition will improve Outcome: Completed/Met Goal: Knowledge of the prescribed therapeutic regimen will improve Outcome: Completed/Met   Problem: Education: Goal: Knowledge of General Education information will improve Description: Including pain rating scale, medication(s)/side effects and non-pharmacologic comfort measures Outcome: Completed/Met   Problem: Nutrition: Goal: Adequate nutrition will be maintained Outcome: Completed/Met

## 2019-09-14 ENCOUNTER — Ambulatory Visit: Payer: Self-pay | Admitting: Physician Assistant

## 2019-09-14 LAB — GLUCOSE, CAPILLARY
Glucose-Capillary: 98 mg/dL (ref 70–99)
Glucose-Capillary: 77 mg/dL (ref 70–99)
Glucose-Capillary: 120 mg/dL — ABNORMAL HIGH (ref 70–99)
Glucose-Capillary: 142 mg/dL — ABNORMAL HIGH (ref 70–99)
Glucose-Capillary: 115 mg/dL — ABNORMAL HIGH (ref 70–99)

## 2019-09-14 NOTE — Progress Notes (Addendum)
Daily Antepartum Note  Admission Date: 09/06/2019 Current Date: 09/14/2019 11:21 AM  Monica Wolfe is a 34 y.o. [id number] @ [redacted]w[redacted]d, HD#9, admitted for PPROM @ 28/1.  Pregnancy complicated by: Patient Active Problem List   Diagnosis Date Noted  . Preterm premature rupture of membranes (PPROM) with onset of labor after 24 hours of rupture in third trimester, antepartum 09/07/2019  . Diabetes mellitus without complication (HCC)   . COVID-19 06/18/2019  . Supervision of high risk pregnancy, antepartum 05/10/2019  . Chronic hypertension 05/10/2019  . Breast lesion 05/10/2019  . H/O postpartum hemorrhage, currently pregnant 05/10/2019  . Language barrier 05/10/2019  . Bilateral knee pain 07/19/2015  . Health care maintenance 07/19/2015    Overnight/24hr events:  none  Subjective:  No decreased FM, VB or spotting, no LOF, no contractions, no fevers/chills  Objective:    Current Vital Signs 24h Vital Sign Ranges  T 98.2 F (36.8 C) Temp  Avg: 98.2 F (36.8 C)  Min: 97.8 F (36.6 C)  Max: 98.3 F (36.8 C)  BP (!) 105/52 BP  Min: 104/55  Max: 127/79  HR 69 Pulse  Avg: 75.3  Min: 69  Max: 89  RR 18 Resp  Avg: 17.7  Min: 16  Max: 20  SaO2 99 % Room Air SpO2  Avg: 98.7 %  Min: 96 %  Max: 100 %       24 Hour I/O Current Shift I/O  Time Ins Outs No intake/output data recorded. No intake/output data recorded.   FHT: 140 baseline, +accels, no decel, mod variliability Toco:quiet x 69m  Physical exam: General: Well nourished, well developed female in no acute distress. Abdomen: gravid nttp Cardiovascular: S1, S2 normal, no murmur, rub or gallop, regular rate and rhythm Respiratory: CTAB Extremities: no clubbing, cyanosis or edema Skin: Warm and dry.   Medications: Current Facility-Administered Medications  Medication Dose Route Frequency Provider Last Rate Last Admin  . acetaminophen (TYLENOL) tablet 650 mg  650 mg Oral Q6H PRN Reva Bores, MD   650 mg at 09/07/19  0541  . aspirin chewable tablet 81 mg  81 mg Oral Daily Dove, Myra C, MD   81 mg at 09/14/19 1051  . insulin aspart (novoLOG) injection 0-16 Units  0-16 Units Subcutaneous TID PC Levie Heritage, DO   4 Units at 09/13/19 2053  . prenatal multivitamin tablet 1 tablet  1 tablet Oral q morning - 10a Lazaro Arms, MD   1 tablet at 09/14/19 1051    Labs:  No new labs  Radiology:  No new imaging 3/30: cephalic by bedside u/s 3/24: cephalic, EFW 97%  Assessment & Plan:  Pt doing well *Pregnancy: routine care. Bid NSTs *PPROM: delivery at 34wks or later or for any s/s of maternal-fetal distress -s/p latency abx -GBS neg *Preterm: Mg if <32wks for fetal NP. Repeat steroids next week -s/p BMZ on 3/30 and 3/31 -s/p NICU consult *CV, cHTN: no issues. Continue low dose asa -S/p cardiology consult earlier in pregnancy with normal maternal echo *GDM: CBGs normalizing on no meds. Continue to follow -s/p normal fetal echo at [facility] *h/o COVID: no sequelae *PPx: SCDs *FEN/GI: DM diet *Dispo: inpatient until delivery  Interpeter used  Cornelia Copa MD Attending Center for [facility])

## 2019-09-15 LAB — GLUCOSE, CAPILLARY
Glucose-Capillary: 83 mg/dL (ref 70–99)
Glucose-Capillary: 85 mg/dL (ref 70–99)
Glucose-Capillary: 133 mg/dL — ABNORMAL HIGH (ref 70–99)
Glucose-Capillary: 165 mg/dL — ABNORMAL HIGH (ref 70–99)

## 2019-09-15 LAB — TYPE AND SCREEN
ABO/RH(D): O POS
Antibody Screen: NEGATIVE

## 2019-09-15 NOTE — Progress Notes (Signed)
Daily Antepartum Note  Admission Date: 09/06/2019 Current Date: 09/15/2019 1:11 PM  Scarlett Avelina Laine is a 34 y.o. [id number] @ [redacted]w[redacted]d, HD#10, admitted for PPROM @ 28/1.  Pregnancy complicated by: Patient Active Problem List   Diagnosis Date Noted  . Preterm premature rupture of membranes (PPROM) with onset of labor after 24 hours of rupture in third trimester, antepartum 09/07/2019  . Diabetes mellitus without complication (HCC)   . COVID-19 06/18/2019  . Supervision of high risk pregnancy, antepartum 05/10/2019  . Chronic hypertension 05/10/2019  . Breast lesion 05/10/2019  . H/O postpartum hemorrhage, currently pregnant 05/10/2019  . Language barrier 05/10/2019  . Bilateral knee pain 07/19/2015  . Health care maintenance 07/19/2015    Overnight/24hr events:  none  Subjective:  No decreased FM or contractions. Has some pink d/c with wiping and has clear LOF. No belly pain or discharge smell.   Objective:    Current Vital Signs 24h Vital Sign Ranges  T 98.4 F (36.9 C) Temp  Avg: 98.4 F (36.9 C)  Min: 98.1 F (36.7 C)  Max: 98.5 F (36.9 C)  BP 124/87 BP  Min: 98/54  Max: 132/82  HR 71 Pulse  Avg: 75  Min: 64  Max: 85  RR 18 Resp  Avg: 18  Min: 18  Max: 18  SaO2 96 % Room Air SpO2  Avg: 98 %  Min: 96 %  Max: 100 %       24 Hour I/O Current Shift I/O  Time Ins Outs No intake/output data recorded. No intake/output data recorded.   FHT: 140 baseline, +accels, no decel, mod variliability Toco:quiet x 57m  Physical exam: General: Well nourished, well developed female in no acute distress. Abdomen: gravid nttp Respiratory: No respiratory distress Extremities: no clubbing, cyanosis or edema Skin: Warm and dry.   Medications: Current Facility-Administered Medications  Medication Dose Route Frequency Provider Last Rate Last Admin  . acetaminophen (TYLENOL) tablet 650 mg  650 mg Oral Q6H PRN Reva Bores, MD   650 mg at 09/07/19 0541  . aspirin chewable tablet 81  mg  81 mg Oral Daily Dove, Myra C, MD   81 mg at 09/15/19 0903  . insulin aspart (novoLOG) injection 0-16 Units  0-16 Units Subcutaneous TID PC Levie Heritage, DO   3 Units at 09/14/19 2118  . prenatal multivitamin tablet 1 tablet  1 tablet Oral q morning - 10a Lazaro Arms, MD   1 tablet at 09/15/19 3976    Labs:  No new labs  Radiology:  No new imaging 3/30: cephalic by bedside u/s 3/24: cephalic, EFW 97%  Assessment & Plan:  Pt doing well *Pregnancy: routine care. Bid NSTs *PPROM: delivery at 34wks or later or for any s/s of maternal-fetal distress -s/p latency abx -GBS neg *Preterm: Mg if <32wks for fetal NP. Repeat steroids next week -s/p BMZ on 3/30 and 3/31 -s/p NICU consult *CV, cHTN: no issues. Continue low dose asa -S/p cardiology consult earlier in pregnancy with normal maternal echo *GDM: CBGs normalizing on no meds. Continue to follow -s/p normal fetal echo at [facility] *h/o COVID: no sequelae *PPx: SCDs *FEN/GI: DM diet *Dispo: inpatient until delivery  Interpeter used  Cornelia Copa MD Attending Center for [facility])

## 2019-09-15 NOTE — Progress Notes (Signed)
Initial Nutrition Assessment  DOCUMENTATION CODES:   Obesity unspecified  INTERVENTION:  Carbohydrate modified gestational diabetic diet   NUTRITION DIAGNOSIS:   Increased nutrient needs related to (pregancy and fetal growth requirements) as evidenced by (29 weeks IUP).  GOAL:   Patient will meet greater than or equal to 90% of their needs  MONITOR:   Labs  REASON FOR ASSESSMENT:   Antenatal, Gestational Diabetes    ASSESSMENT:   29 3/7 weeks, PROM.  GDM. Prepreg weight 215 lbs, BMI 35.9.  11 lb weight gain. Will be adm until delivery. Not requiring medication to manage GDM   Diet Order:   Diet Order            Diet gestational carb mod Fluid consistency: Thin; Room service appropriate? Yes  Diet effective now              EDUCATION NEEDS:   Education needs have been addressed(Outpt educ 06/28/19, 07/26/19)  Skin:  Skin Assessment: Reviewed RN Assessment   Height:   Ht Readings from Last 1 Encounters:  09/06/19 5\' 6"  (1.676 m)    Weight:   Wt Readings from Last 1 Encounters:  09/06/19 103 kg    Ideal Body Weight:   130 lbs  BMI:  Body mass index is 36.64 kg/m.  Estimated Nutritional Needs:   Kcal:  2200-2400  Protein:  100-110 g  Fluid:  2.5 L    09/08/19 M.M LDN Neonatal Nutrition Support Specialist/RD III

## 2019-09-16 LAB — GLUCOSE, CAPILLARY
Glucose-Capillary: 83 mg/dL (ref 70–99)
Glucose-Capillary: 90 mg/dL (ref 70–99)
Glucose-Capillary: 97 mg/dL (ref 70–99)
Glucose-Capillary: 180 mg/dL — ABNORMAL HIGH (ref 70–99)
Glucose-Capillary: 139 mg/dL — ABNORMAL HIGH (ref 70–99)

## 2019-09-16 NOTE — Progress Notes (Signed)
Daily Antepartum Note  Admission Date: 09/06/2019 Current Date: 09/16/2019 2:15 PM  Monica Wolfe is a 34 y.o. [id number] @ [redacted]w[redacted]d, HD#11, admitted for PPROM @ 28/1.  Pregnancy complicated by: Patient Active Problem List   Diagnosis Date Noted  . Preterm premature rupture of membranes (PPROM) with onset of labor after 24 hours of rupture in third trimester, antepartum 09/07/2019  . Diabetes mellitus without complication (HCC)   . COVID-19 06/18/2019  . Supervision of high risk pregnancy, antepartum 05/10/2019  . Chronic hypertension 05/10/2019  . Breast lesion 05/10/2019  . H/O postpartum hemorrhage, currently pregnant 05/10/2019  . Language barrier 05/10/2019  . Bilateral knee pain 07/19/2015  . Health care maintenance 07/19/2015    Overnight/24hr events:  none  Subjective:  No decreased FM or PTL s/s. +LOF, no VB  Objective:    Current Vital Signs 24h Vital Sign Ranges  T 98.4 F (36.9 C) Temp  Avg: 98.1 F (36.7 C)  Min: 97.5 F (36.4 C)  Max: 98.4 F (36.9 C)  BP 122/65 BP  Min: 114/63  Max: 134/89  HR 70 Pulse  Avg: 75.7  Min: 67  Max: 91  RR 17 Resp  Avg: 17.8  Min: 17  Max: 18  SaO2 100 % Room Air SpO2  Avg: 99.7 %  Min: 98 %  Max: 100 %       24 Hour I/O Current Shift I/O  Time Ins Outs No intake/output data recorded. No intake/output data recorded.   FHT: 145 baseline, +accels, no decel, mod variliability Toco:quiet x 19m  Physical exam: General: Well nourished, well developed female in no acute distress. Abdomen: gravid nttp Respiratory: No respiratory distress Extremities: no clubbing, cyanosis or edema Skin: Warm and dry.   Medications: Current Facility-Administered Medications  Medication Dose Route Frequency Provider Last Rate Last Admin  . acetaminophen (TYLENOL) tablet 650 mg  650 mg Oral Q6H PRN Reva Bores, MD   650 mg at 09/07/19 0541  . aspirin chewable tablet 81 mg  81 mg Oral Daily Dove, Myra C, MD   81 mg at 09/16/19 1031  .  insulin aspart (novoLOG) injection 0-16 Units  0-16 Units Subcutaneous TID PC Levie Heritage, DO   4 Units at 09/15/19 2122  . prenatal multivitamin tablet 1 tablet  1 tablet Oral q morning - 10a Lazaro Arms, MD   1 tablet at 09/16/19 1031    Labs:  AM fasting 83/83/98/80 2 hour PP Breakfast 90/85/77/80 Lunch 133/120/111 Dinner 165/142/184/108  Radiology:  No new imaging 3/30: cephalic by bedside u/s 3/24: cephalic, EFW 97%  Assessment & Plan:  Pt doing well *Pregnancy: routine care. Reactive NST. Bid NSTs *PPROM: delivery at 34wks or later or for any s/s of maternal-fetal distress -s/p latency abx -GBS neg *Preterm: Mg if <32wks for fetal NP. Repeat steroids next week -s/p BMZ on 3/30 and 3/31 -s/p NICU consult *CV, cHTN: no issues. Continue low dose asa -S/p cardiology consult earlier in pregnancy with normal maternal echo *GDM: follow up CBGs, particularly 2h PP dinner. May have to start something with dinner -s/p normal fetal echo at [facility] *h/o COVID: no sequelae *PPx: SCDs *FEN/GI: DM diet *Dispo: inpatient until delivery  Interpeter used  Cornelia Copa MD Attending Center for [facility])

## 2019-09-17 LAB — GLUCOSE, CAPILLARY
Glucose-Capillary: 96 mg/dL (ref 70–99)
Glucose-Capillary: 89 mg/dL (ref 70–99)
Glucose-Capillary: 136 mg/dL — ABNORMAL HIGH (ref 70–99)
Glucose-Capillary: 154 mg/dL — ABNORMAL HIGH (ref 70–99)
Glucose-Capillary: 130 mg/dL — ABNORMAL HIGH (ref 70–99)

## 2019-09-17 MED ORDER — METFORMIN HCL 500 MG PO TABS
500.0000 mg | ORAL_TABLET | Freq: Every day | ORAL | Status: DC
  Administered 2019-09-17: 500 mg via ORAL
  Filled 2019-09-17: qty 1

## 2019-09-17 NOTE — Progress Notes (Signed)
Daily Antepartum Note  Admission Date: 09/06/2019 Current Date: 09/17/2019 6:45 AM  Monica Wolfe is a 34 y.o. [id number] @ [redacted]w[redacted]d, HD#12, admitted for PPROM @ 28/1.  Pregnancy complicated by: Patient Active Problem List   Diagnosis Date Noted  . Preterm premature rupture of membranes (PPROM) with onset of labor after 24 hours of rupture in third trimester, antepartum 09/07/2019  . Diabetes mellitus without complication (HCC)   . COVID-19 06/18/2019  . Supervision of high risk pregnancy, antepartum 05/10/2019  . Chronic hypertension 05/10/2019  . Breast lesion 05/10/2019  . H/O postpartum hemorrhage, currently pregnant 05/10/2019  . Language barrier 05/10/2019  . Bilateral knee pain 07/19/2015  . Health care maintenance 07/19/2015    Overnight/24hr events:  none  Subjective:  No decreased FM or PTL s/s. +LOF (pink tinged), no VB  Objective:    Current Vital Signs 24h Vital Sign Ranges  T 97.7 F (36.5 C) Temp  Avg: 98.1 F (36.7 C)  Min: 97.5 F (36.4 C)  Max: 98.6 F (37 C)  BP 100/62 BP  Min: 100/62  Max: 137/88  HR 69 Pulse  Avg: 72.9  Min: 67  Max: 81  RR 18 Resp  Avg: 17.9  Min: 17  Max: 18  SaO2 100 % (Room Air ) SpO2  Avg: 99.1 %  Min: 98 %  Max: 100 %       24 Hour I/O Current Shift I/O  Time Ins Outs No intake/output data recorded. No intake/output data recorded.   FHT: 140s baseline, +accels, no decel, mod variliability Toco:quiet x 34m  Physical exam: General: Well nourished, well developed female in no acute distress. Abdomen: gravid nttp Respiratory: No respiratory distress Extremities: no clubbing, cyanosis or edema Skin: Warm and dry.   Medications: Current Facility-Administered Medications  Medication Dose Route Frequency Provider Last Rate Last Admin  . acetaminophen (TYLENOL) tablet 650 mg  650 mg Oral Q6H PRN Reva Bores, MD   650 mg at 09/07/19 0541  . aspirin chewable tablet 81 mg  81 mg Oral Daily Dove, Myra C, MD   81 mg at  09/16/19 1031  . insulin aspart (novoLOG) injection 0-16 Units  0-16 Units Subcutaneous TID PC Levie Heritage, DO   4 Units at 09/16/19 1946  . prenatal multivitamin tablet 1 tablet  1 tablet Oral q morning - 10a Lazaro Arms, MD   1 tablet at 09/16/19 1031    Labs:  AM fasting 96/83/83/98/80 2 hour PP Breakfast 90/90/85/77/80 Lunch 97/133/120/111 Dinner 180/165/142/184/108  Radiology:  3/30: cephalic by bedside u/s 3/24: cephalic, EFW 97%  Assessment & Plan:  Pt doing well *Pregnancy: routine care. Reactive NST. Bid NSTs *PPROM: delivery at 34wks or later or for any s/s of maternal-fetal distress -s/p latency abx -GBS neg *Preterm: Mg if <32wks for fetal NP. Repeat steroids next week -s/p BMZ on 3/30 and 3/31 -s/p NICU consult *CV, cHTN: no issues. Continue low dose asa -S/p cardiology consult earlier in pregnancy with normal maternal echo *GDM: follow up CBGs, particularly 2h PP dinner. Will start metformin 500mg  w/ dinner -s/p normal fetal echo at [facility] *h/o COVID: no sequelae *PPx: SCDs *FEN/GI: DM diet *Dispo: inpatient until delivery  Interpeter used  [facility], MD Attending Center for [facility])

## 2019-09-17 NOTE — Progress Notes (Signed)
Inpatient Diabetes Program Recommendations  ADA Standards of Care 2021 Diabetes in Pregnancy Target Glucose Ranges:  Fasting: 60 - 90 mg/dL Preprandial: 60 - 591 mg/dL 1 hr postprandial: Less than 140mg /dL (from first bite of meal) 2 hr postprandial: Less than 120 mg/dL (from first bit of meal)   Lab Results  Component Value Date   GLUCAP 96 09/17/2019   HGBA1C 5.6 09/06/2019    Review of Glycemic Control Results for Monica Wolfe, Monica Wolfe (MRN [name]) as of 09/17/2019 08:41  Ref. Range 09/16/2019 06:50 09/16/2019 10:25 09/16/2019 15:00 09/16/2019 19:31 09/16/2019 21:55 09/17/2019 04:58  Glucose-Capillary Latest Ref Range: 70 - 99 mg/dL 83 90 97 11/17/2019 (H) 144 (H) 96   Diabetes history: GDM  Current orders for Inpatient glycemic control:  Novolog 0-16 units Metformin 500 mg Daily at supper  Inpatient Diabetes Program Recommendations:    Noted metformin 500 mg daily at supper to start.  Pt may benefit from Novolog 2 units meal coverage at supper only.  Thanks,  360 RN, MSN, BC-ADM Inpatient Diabetes Coordinator Team Pager [phone] (8a-5p)

## 2019-09-18 LAB — GLUCOSE, CAPILLARY
Glucose-Capillary: 94 mg/dL (ref 70–99)
Glucose-Capillary: 104 mg/dL — ABNORMAL HIGH (ref 70–99)
Glucose-Capillary: 133 mg/dL — ABNORMAL HIGH (ref 70–99)
Glucose-Capillary: 102 mg/dL — ABNORMAL HIGH (ref 70–99)
Glucose-Capillary: 102 mg/dL — ABNORMAL HIGH (ref 70–99)

## 2019-09-18 LAB — TYPE AND SCREEN
ABO/RH(D): O POS
Antibody Screen: NEGATIVE

## 2019-09-18 MED ORDER — METFORMIN HCL 500 MG PO TABS
500.0000 mg | ORAL_TABLET | Freq: Two times a day (BID) | ORAL | Status: DC
  Administered 2019-09-18 – 2019-09-21 (×7): 500 mg via ORAL
  Filled 2019-09-18 (×8): qty 1

## 2019-09-18 NOTE — Progress Notes (Signed)
Patient ID: BETHENE HANKINSON, female   DOB: 21-May-1986, 34 y.o.   MRN: [id number] FACULTY PRACTICE ANTEPARTUM(COMPREHENSIVE) NOTE  Symphony Avelina Laine is a 34 y.o. [id number] at [redacted]w[redacted]d  who is admitted for rupture of membranes.    Fetal presentation is cephalic. Length of Stay:  0  Days  Date of admission:09/06/2019  Subjective: Patient reports feeling well with persistent leakage of fluid Patient reports the fetal movement as active. Patient reports uterine contraction  activity as none. Patient reports  vaginal bleeding as none. Patient describes fluid per vagina as Other pale pink.  Vitals:  Blood pressure 126/74, pulse 71, temperature 98.3 F (36.8 C), temperature source Oral, resp. rate 16, height 5\' 6"  (1.676 m), weight 103 kg, last menstrual period 01/17/2019, SpO2 97 %. Vitals:   09/17/19 1924 09/17/19 2307 09/18/19 0617 09/18/19 0733  BP: (!) 111/58 100/75 (!) 103/55 126/74  Pulse: 84 87 70 71  Resp: 16 17 16 16   Temp: 98.1 F (36.7 C) 97.7 F (36.5 C) 98.3 F (36.8 C) 98.3 F (36.8 C)  TempSrc: Oral Oral Oral Oral  SpO2: 98% 97% 98% 97%  Weight:      Height:       Physical Examination: GENERAL: Well-developed, well-nourished female in no acute distress.  LUNGS: Clear to auscultation bilaterally.  HEART: Regular rate and rhythm. ABDOMEN: Soft, nontender, gravid PELVIC: Not performed EXTREMITIES: No cyanosis, clubbing, or edema, 2+ distal pulses.   Fetal Monitoring:  Baseline: 145 bpm, Variability: Good {> 6 bpm), Accelerations: Reactive and Decelerations: Absent   reactive  Labs:  Results for orders placed or performed during the hospital encounter of 09/06/19 (from the past 24 hour(s))  Glucose, capillary   Collection Time: 09/17/19 10:44 AM  Result Value Ref Range   Glucose-Capillary 89 70 - 99 mg/dL  Glucose, capillary   Collection Time: 09/17/19  3:38 PM  Result Value Ref Range   Glucose-Capillary 136 (H) 70 - 99 mg/dL  Glucose, capillary   Collection Time: 09/17/19  9:38 PM  Result Value Ref Range   Glucose-Capillary 154 (H) 70 - 99 mg/dL  Glucose, capillary   Collection Time: 09/17/19 11:09 PM  Result Value Ref Range   Glucose-Capillary 130 (H) 70 - 99 mg/dL  Glucose, capillary   Collection Time: 09/18/19  6:13 AM  Result Value Ref Range   Glucose-Capillary 94 70 - 99 mg/dL    Imaging Studies:    No results found.   Medications:  Scheduled . aspirin  81 mg Oral Daily  . insulin aspart  0-16 Units Subcutaneous TID PC  . metFORMIN  500 mg Oral BID WC  . prenatal multivitamin  1 tablet Oral q morning - 10a   I have reviewed the patient's current medications.  ASSESSMENT: 11/17/19 [redacted]w[redacted]d Estimated Date of Delivery: 11/28/19  Patient Active Problem List   Diagnosis Date Noted  . Preterm premature rupture of membranes (PPROM) with onset of labor after 24 hours of rupture in third trimester, antepartum 09/07/2019  . Diabetes mellitus without complication (HCC)   . COVID-19 06/18/2019  . Supervision of high risk pregnancy, antepartum 05/10/2019  . Chronic hypertension 05/10/2019  . Breast lesion 05/10/2019  . H/O postpartum hemorrhage, currently pregnant 05/10/2019  . Language barrier 05/10/2019  . Bilateral knee pain 07/19/2015  . Health care maintenance 07/19/2015    PLAN: Patient completed BMZ course Patient completed latency antibiotics Will increase metformin to 500 BID Continue inpatient monitoring for si/sx of chorio   Analya Louissaint 09/18/2019,7:53 AM

## 2019-09-19 ENCOUNTER — Encounter (HOSPITAL_COMMUNITY): Payer: Self-pay | Admitting: Obstetrics & Gynecology

## 2019-09-19 ENCOUNTER — Inpatient Hospital Stay (HOSPITAL_COMMUNITY): Payer: Self-pay | Admitting: Anesthesiology

## 2019-09-19 DIAGNOSIS — O24419 Gestational diabetes mellitus in pregnancy, unspecified control: Secondary | ICD-10-CM

## 2019-09-19 DIAGNOSIS — O1092 Unspecified pre-existing hypertension complicating childbirth: Secondary | ICD-10-CM

## 2019-09-19 DIAGNOSIS — Z3A28 28 weeks gestation of pregnancy: Secondary | ICD-10-CM

## 2019-09-19 DIAGNOSIS — Z3A30 30 weeks gestation of pregnancy: Secondary | ICD-10-CM

## 2019-09-19 DIAGNOSIS — O24425 Gestational diabetes mellitus in childbirth, controlled by oral hypoglycemic drugs: Secondary | ICD-10-CM

## 2019-09-19 LAB — CBC
HCT: 36 % (ref 36.0–46.0)
Hemoglobin: 11.6 g/dL — ABNORMAL LOW (ref 12.0–15.0)
MCH: 27.6 pg (ref 26.0–34.0)
MCHC: 32.2 g/dL (ref 30.0–36.0)
MCV: 85.7 fL (ref 80.0–100.0)
Platelets: 247 10*3/uL (ref 150–400)
RBC: 4.2 MIL/uL (ref 3.87–5.11)
RDW: 14.6 % (ref 11.5–15.5)
WBC: 16.4 10*3/uL — ABNORMAL HIGH (ref 4.0–10.5)
nRBC: 0 % (ref 0.0–0.2)

## 2019-09-19 LAB — COMPREHENSIVE METABOLIC PANEL
ALT: 13 U/L (ref 0–44)
AST: 18 U/L (ref 15–41)
Albumin: 2.7 g/dL — ABNORMAL LOW (ref 3.5–5.0)
Alkaline Phosphatase: 85 U/L (ref 38–126)
Anion gap: 12 (ref 5–15)
BUN: 7 mg/dL (ref 6–20)
CO2: 18 mmol/L — ABNORMAL LOW (ref 22–32)
Calcium: 8.7 mg/dL — ABNORMAL LOW (ref 8.9–10.3)
Chloride: 103 mmol/L (ref 98–111)
Creatinine, Ser: 0.41 mg/dL — ABNORMAL LOW (ref 0.44–1.00)
GFR calc Af Amer: >60 mL/min (ref >60)
GFR calc non Af Amer: >60 mL/min (ref >60)
Glucose, Bld: 86 mg/dL (ref 70–99)
Potassium: 3.6 mmol/L (ref 3.5–5.1)
Sodium: 133 mmol/L — ABNORMAL LOW (ref 135–145)
Total Bilirubin: 0.3 mg/dL (ref 0.3–1.2)
Total Protein: 6.4 g/dL — ABNORMAL LOW (ref 6.5–8.1)

## 2019-09-19 LAB — RESPIRATORY PANEL BY RT PCR (FLU A&B, COVID)
Influenza A by PCR: NEGATIVE
Influenza B by PCR: NEGATIVE
SARS Coronavirus 2 by RT PCR: NEGATIVE

## 2019-09-19 LAB — RPR: RPR Ser Ql: NONREACTIVE

## 2019-09-19 LAB — GLUCOSE, CAPILLARY
Glucose-Capillary: 82 mg/dL (ref 70–99)
Glucose-Capillary: 104 mg/dL — ABNORMAL HIGH (ref 70–99)
Glucose-Capillary: 158 mg/dL — ABNORMAL HIGH (ref 70–99)
Glucose-Capillary: 272 mg/dL — ABNORMAL HIGH (ref 70–99)

## 2019-09-19 LAB — MAGNESIUM: Magnesium: 3.9 mg/dL — ABNORMAL HIGH (ref 1.7–2.4)

## 2019-09-19 MED ORDER — LACTATED RINGERS IV SOLN: 500.0000 mL | INTRAVENOUS | Status: DC | PRN

## 2019-09-19 MED ORDER — TRANEXAMIC ACID-NACL 1000-0.7 MG/100ML-% IV SOLN: 1000.0000 mg | INTRAVENOUS | Status: DC

## 2019-09-19 MED ORDER — BETAMETHASONE SOD PHOS & ACET 6 (3-3) MG/ML IJ SUSP
12.0000 mg | INTRAMUSCULAR | Status: DC
  Administered 2019-09-19: 12 mg via INTRAMUSCULAR
  Filled 2019-09-19: qty 5

## 2019-09-19 MED ORDER — MAGNESIUM HYDROXIDE 400 MG/5ML PO SUSP: 30.0000 mL | ORAL | Status: DC | PRN

## 2019-09-19 MED ORDER — OXYCODONE HCL 5 MG PO TABS: 10.0000 mg | ORAL_TABLET | ORAL | Status: DC | PRN

## 2019-09-19 MED ORDER — FENTANYL-BUPIVACAINE-NACL 0.5-0.125-0.9 MG/250ML-% EP SOLN
12.0000 mL/h | EPIDURAL | Status: DC | PRN
  Filled 2019-09-19: qty 250

## 2019-09-19 MED ORDER — EPHEDRINE 5 MG/ML INJ: 10.0000 mg | INTRAVENOUS | Status: DC | PRN

## 2019-09-19 MED ORDER — DIBUCAINE (PERIANAL) 1 % EX OINT: 1.0000 "application " | TOPICAL_OINTMENT | CUTANEOUS | Status: DC | PRN

## 2019-09-19 MED ORDER — FLEET ENEMA 7-19 GM/118ML RE ENEM: 1.0000 | ENEMA | Freq: Every day | RECTAL | Status: DC | PRN

## 2019-09-19 MED ORDER — ACETAMINOPHEN 325 MG PO TABS: 650.0000 mg | ORAL_TABLET | ORAL | Status: DC | PRN

## 2019-09-19 MED ORDER — FENTANYL CITRATE (PF) 100 MCG/2ML IJ SOLN
INTRAMUSCULAR | Status: AC
  Filled 2019-09-19: qty 2

## 2019-09-19 MED ORDER — OXYCODONE-ACETAMINOPHEN 5-325 MG PO TABS: 1.0000 | ORAL_TABLET | ORAL | Status: DC | PRN

## 2019-09-19 MED ORDER — PHENYLEPHRINE 40 MCG/ML (10ML) SYRINGE FOR IV PUSH (FOR BLOOD PRESSURE SUPPORT)
80.0000 ug | PREFILLED_SYRINGE | INTRAVENOUS | Status: DC | PRN
  Administered 2019-09-19 (×2): 80 ug via INTRAVENOUS
  Filled 2019-09-19: qty 10

## 2019-09-19 MED ORDER — WITCH HAZEL-GLYCERIN EX PADS: 1.0000 "application " | MEDICATED_PAD | CUTANEOUS | Status: DC | PRN

## 2019-09-19 MED ORDER — ONDANSETRON HCL 4 MG PO TABS: 4.0000 mg | ORAL_TABLET | ORAL | Status: DC | PRN

## 2019-09-19 MED ORDER — OXYCODONE HCL 5 MG PO TABS: 5.0000 mg | ORAL_TABLET | ORAL | Status: DC | PRN

## 2019-09-19 MED ORDER — LACTATED RINGERS IV SOLN: INTRAVENOUS | Status: DC

## 2019-09-19 MED ORDER — SIMETHICONE 80 MG PO CHEW: 80.0000 mg | CHEWABLE_TABLET | ORAL | Status: DC | PRN

## 2019-09-19 MED ORDER — MAGNESIUM SULFATE 40 GM/1000ML IV SOLN: 2.0000 g/h | INTRAVENOUS | Status: DC

## 2019-09-19 MED ORDER — SENNOSIDES-DOCUSATE SODIUM 8.6-50 MG PO TABS
2.0000 | ORAL_TABLET | ORAL | Status: DC
  Administered 2019-09-19: 2 via ORAL
  Filled 2019-09-19 (×2): qty 2

## 2019-09-19 MED ORDER — DIPHENHYDRAMINE HCL 25 MG PO CAPS: 25.0000 mg | ORAL_CAPSULE | Freq: Four times a day (QID) | ORAL | Status: DC | PRN

## 2019-09-19 MED ORDER — OXYCODONE-ACETAMINOPHEN 5-325 MG PO TABS: 2.0000 | ORAL_TABLET | ORAL | Status: DC | PRN

## 2019-09-19 MED ORDER — HYDROXYZINE HCL 50 MG PO TABS
50.0000 mg | ORAL_TABLET | Freq: Four times a day (QID) | ORAL | Status: DC | PRN
  Administered 2019-09-19: 50 mg via ORAL
  Filled 2019-09-19 (×2): qty 1

## 2019-09-19 MED ORDER — PHENYLEPHRINE 40 MCG/ML (10ML) SYRINGE FOR IV PUSH (FOR BLOOD PRESSURE SUPPORT): 80.0000 ug | PREFILLED_SYRINGE | INTRAVENOUS | Status: DC | PRN

## 2019-09-19 MED ORDER — SOD CITRATE-CITRIC ACID 500-334 MG/5ML PO SOLN: 30.0000 mL | ORAL | Status: DC | PRN

## 2019-09-19 MED ORDER — SODIUM CHLORIDE (PF) 0.9 % IJ SOLN
INTRAMUSCULAR | Status: DC | PRN
  Administered 2019-09-19: 12 mL/h via EPIDURAL

## 2019-09-19 MED ORDER — LIDOCAINE HCL (PF) 1 % IJ SOLN: 30.0000 mL | INTRAMUSCULAR | Status: DC | PRN

## 2019-09-19 MED ORDER — OXYTOCIN 40 UNITS IN NORMAL SALINE INFUSION - SIMPLE MED
2.5000 [IU]/h | INTRAVENOUS | Status: DC
  Administered 2019-09-19: 2.5 [IU]/h via INTRAVENOUS
  Filled 2019-09-19: qty 1000

## 2019-09-19 MED ORDER — DIPHENHYDRAMINE HCL 50 MG/ML IJ SOLN: 12.5000 mg | INTRAMUSCULAR | Status: DC | PRN

## 2019-09-19 MED ORDER — ACETAMINOPHEN 325 MG PO TABS
650.0000 mg | ORAL_TABLET | ORAL | Status: DC | PRN
  Administered 2019-09-20: 650 mg via ORAL
  Filled 2019-09-19: qty 2

## 2019-09-19 MED ORDER — LIDOCAINE-EPINEPHRINE (PF) 2 %-1:200000 IJ SOLN
INTRAMUSCULAR | Status: DC | PRN
  Administered 2019-09-19: 2 mL via EPIDURAL
  Administered 2019-09-19: 3 mL via EPIDURAL

## 2019-09-19 MED ORDER — ONDANSETRON HCL 4 MG/2ML IJ SOLN: 4.0000 mg | Freq: Four times a day (QID) | INTRAMUSCULAR | Status: DC | PRN

## 2019-09-19 MED ORDER — LACTATED RINGERS IV SOLN: 500.0000 mL | Freq: Once | INTRAVENOUS | Status: DC

## 2019-09-19 MED ORDER — PRENATAL MULTIVITAMIN CH
1.0000 | ORAL_TABLET | Freq: Every day | ORAL | Status: DC
  Administered 2019-09-19 – 2019-09-20 (×2): 1 via ORAL
  Filled 2019-09-19 (×2): qty 1

## 2019-09-19 MED ORDER — IBUPROFEN 600 MG PO TABS
600.0000 mg | ORAL_TABLET | Freq: Four times a day (QID) | ORAL | Status: DC
  Administered 2019-09-19 – 2019-09-21 (×8): 600 mg via ORAL
  Filled 2019-09-19 (×8): qty 1

## 2019-09-19 MED ORDER — MAGNESIUM SULFATE 40 GM/1000ML IV SOLN
INTRAVENOUS | Status: AC
  Filled 2019-09-19: qty 1000

## 2019-09-19 MED ORDER — FENTANYL CITRATE (PF) 100 MCG/2ML IJ SOLN
50.0000 ug | INTRAMUSCULAR | Status: DC | PRN
  Administered 2019-09-19: 100 ug via INTRAVENOUS

## 2019-09-19 MED ORDER — OXYTOCIN BOLUS FROM INFUSION
500.0000 mL | Freq: Once | INTRAVENOUS | Status: AC
  Administered 2019-09-19: 500 mL via INTRAVENOUS

## 2019-09-19 MED ORDER — MAGNESIUM SULFATE BOLUS VIA INFUSION
6.0000 g | Freq: Once | INTRAVENOUS | Status: AC
  Administered 2019-09-19: 07:00:00 6 g via INTRAVENOUS

## 2019-09-19 MED ORDER — BENZOCAINE-MENTHOL 20-0.5 % EX AERO: 1.0000 "application " | INHALATION_SPRAY | CUTANEOUS | Status: DC | PRN

## 2019-09-19 MED ORDER — COCONUT OIL OIL: 1.0000 "application " | TOPICAL_OIL | Status: DC | PRN

## 2019-09-19 MED ORDER — ONDANSETRON HCL 4 MG/2ML IJ SOLN: 4.0000 mg | INTRAMUSCULAR | Status: DC | PRN

## 2019-09-19 NOTE — Discharge Summary (Signed)
Postpartum Discharge Summary  Date of Service updated 09/21/19     Patient Name: Monica Wolfe DOB: 1985-09-17 MRN: [id number]  Date of admission: 09/06/2019 Delivering Provider: Clarnce Flock   Date of discharge: 09/21/2019  Admitting diagnosis: Preterm labor in third trimester [O60.03] Intrauterine pregnancy: [redacted]w[redacted]d    Secondary diagnosis:  Principal Problem:   Preterm labor in third trimester Active Problems:   Supervision of high risk pregnancy, antepartum   Chronic hypertension   H/O postpartum hemorrhage, currently pregnant   Language barrier   Preterm premature rupture of membranes (PPROM) with onset of labor after 24 hours of rupture in third trimester, antepartum   Preterm delivery  Additional problems: NA     Discharge diagnosis: Preterm Pregnancy Delivered, CHTN and GDM A2                                                                                                Post partum procedures:NA  Augmentation: none  Complications: RQPR>91hours  Hospital course:  Onset of Labor With Vaginal Delivery     34y.o. yo [id number] [id number] admitted in Latent Labor on 09/06/2019. Patient had an uncomplicated labor course as follows: admitted 09/06/2019 with PPROM. Managed on antepartum in usual fashion until 09/19/2019 when she started to contract painfully and was moved to L&D. She received a rescue dose of betamethasone and was started on magnesium for neuroprotection. She progressed spontaneously to complete and had uncomplicated NSVD. Membrane Rupture Time/Date: 12:00 AM ,09/07/2019   Intrapartum Procedures: Episiotomy: None [1]                                         Lacerations:  None [1]  Patient had a delivery of a Viable infant. 09/19/2019  Information for the patient's newborn:  EsRaigan, Baria0[[id number]]Delivery Method: Vaginal, Spontaneous(Filed from Delivery Summary)     Pateint had an uncomplicated postpartum course.  She is  ambulating, tolerating a regular diet, passing flatus, and urinating well. Patient is discharged home in stable condition on 09/21/19.  Delivery time: 11:46 AM    Magnesium Sulfate received: Yes BMZ received: Yes Rhophylac:N/A MMR:No Transfusion:No  Physical exam  Vitals:   09/20/19 1930 09/20/19 2331 09/21/19 0546 09/21/19 0741  BP: 129/87 134/84 134/82 (!) 130/93  Pulse: 78 87 71 72  Resp: '18 18 18 18  ' Temp: 98.1 F (36.7 C) 97.7 F (36.5 C) 98 F (36.7 C) 98.1 F (36.7 C)  TempSrc: Oral Oral Oral Oral  SpO2: 100% 99% 100% 100%  Weight:      Height:       General: alert Lochia: appropriate Uterine Fundus: firm Incision: Healing well with no significant drainage DVT Evaluation: No evidence of DVT seen on physical exam. Labs: Lab Results  Component Value Date   WBC 16.4 (H) 09/19/2019   HGB 11.6 (L) 09/19/2019   HCT 36.0 09/19/2019   MCV 85.7 09/19/2019   PLT 247 09/19/2019   CMP Latest Ref Rng &  Units 09/19/2019  Glucose 70 - 99 mg/dL 86  BUN 6 - 20 mg/dL 7  Creatinine 0.44 - 1.00 mg/dL 0.41(L)  Sodium 135 - 145 mmol/L 133(L)  Potassium 3.5 - 5.1 mmol/L 3.6  Chloride 98 - 111 mmol/L 103  CO2 22 - 32 mmol/L 18(L)  Calcium 8.9 - 10.3 mg/dL 8.7(L)  Total Protein 6.5 - 8.1 g/dL 6.4(L)  Total Bilirubin 0.3 - 1.2 mg/dL 0.3  Alkaline Phos 38 - 126 U/L 85  AST 15 - 41 U/L 18  ALT 0 - 44 U/L 13   Edinburgh Score: No flowsheet data found.  Discharge instruction: per After Visit Summary and "Baby and Me Booklet".  After visit meds:  Allergies as of 09/21/2019   No Known Allergies     Medication List    STOP taking these medications   aspirin 81 MG EC tablet     TAKE these medications   ibuprofen 600 MG tablet Commonly known as: ADVIL Take 1 tablet (600 mg total) by mouth every 6 (six) hours.   metFORMIN 500 MG tablet Commonly known as: GLUCOPHAGE Take 1 tablet (500 mg total) by mouth 2 (two) times daily with a meal.   Prenatal Vitamins 28-0.8 MG  Tabs Take 1 tablet by mouth daily.       Diet: carb modified diet  Activity: Advance as tolerated. Pelvic rest for 6 weeks.   Outpatient follow up:4 weeks Follow up Appt: Future Appointments  Date Time Provider [place]  09/29/2019  9:20 AM [place]  10/27/2019  8:20 AM [facility] LAB [facility] WOC  10/27/2019 10:55 AM Sloan Leiter, MD [facility] WOC   Follow up Visit: [facility]. Schedule an appointment as soon as possible for a visit in 4 week(s).   Specialty: Obstetrics and Gynecology Why: Postpartum visit Contact information: [address] [place], [place] [id number] [place] [phone]           Please schedule this patient for Postpartum visit in: 4 weeks with the following provider: MD In-Person For C/S patients schedule nurse incision check in weeks 2 weeks: no High risk pregnancy complicated by: cHTN, GDM vs DM2 Delivery mode:  SVD Anticipated Birth Control:  other/unsure PP Procedures needed: BP check, 2hr GTT  Schedule Integrated BH visit: no   Newborn Data: Live born female  Birth Weight: 4 lb 3.4 oz (1910 g) APGAR: 6, 7  Newborn Delivery   Birth date/time: 09/19/2019 11:46:00 Delivery type: Vaginal, Spontaneous      Baby Feeding: Breast Disposition:NICU   09/21/2019 Chancy Milroy, MD

## 2019-09-19 NOTE — Progress Notes (Signed)
Pt complained of SOB. Lung sounds clear, O2 sats 100%.

## 2019-09-19 NOTE — Progress Notes (Signed)
Faculty Practice OB/GYN Attending Note  Subjective:  Called to evaluate patient with painful frequent contractions since 0630.  Patient is Spanish-speaking only, interpreter present for this encounter. FHR reassuring, no further LOF or vaginal bleeding. Good FM.   Admitted on 09/06/2019 for PPROM    Objective:  Blood pressure 140/87, pulse 87, temperature 98.2 F (36.8 C), temperature source Oral, resp. rate 20, height 5\' 6"  (1.676 m), weight 103 kg, last menstrual period 01/17/2019, SpO2 98 %. FHT  Baseline 140 bpm, moderate variability, +accelerations, no decelerations Toco: q 3-4 minutes Gen: NAD HENT: Normocephalic, atraumatic Lungs: Normal respiratory effort Heart: Regular rate noted Abdomen: NT gravid fundus, soft Cervix: Dilation: 4 Effacement (%): 70 Station: -2 Presentation: Vertex Exam by:: Dr. [email or web address] Ext: 2+ DTRs, no edema, no cyanosis, negative Homan's sign  Bedside scan: Cephalic presentation  CBG (last 3)  Recent Labs    09/18/19 2000 09/18/19 2309 09/19/19 0625  GLUCAP 102* 102* 82    Assessment & Plan:  34 y.o. 38 at [redacted]w[redacted]d admitted for PPROM, now in PTL.  Will transfer to L&D. - Restarting BMZ regimen given that it has been almost 12 days since last one and she is in now in PTL.  Also will restart magnesium sulfate for neuroprotection/tocolysis. - L&D orders placed, RN in charge notified - CBGs will continue to be checked, continue Metformin 500 mg bid for now, RISS - Neonatology notified. Category I FHR tracing currently. - GBS neg on admission, no need for PCN prophylaxis - Continue close observation.  If PTL progresses, hopeful for SVD.   [redacted]w[redacted]d, MD, FACOG Obstetrician & Gynecologist, [facility] [place] for [facility]

## 2019-09-19 NOTE — Anesthesia Preprocedure Evaluation (Signed)
Anesthesia Evaluation  Patient identified by MRN, date of birth, ID band Patient awake    Reviewed: Allergy & Precautions, NPO status , Patient's Chart, lab work & pertinent test results  Airway Mallampati: III  TM Distance: >3 FB Neck ROM: Full    Dental no notable dental hx.    Pulmonary neg pulmonary ROS,  COVID positive 06/17/19, asymptomatic   Pulmonary exam normal breath sounds clear to auscultation       Cardiovascular hypertension (gHTN), Normal cardiovascular exam Rhythm:Regular Rate:Normal     Neuro/Psych negative neurological ROS  negative psych ROS   GI/Hepatic negative GI ROS, Neg liver ROS,   Endo/Other  diabetes, GestationalMorbid obesity  Renal/GU negative Renal ROS  negative genitourinary   Musculoskeletal negative musculoskeletal ROS (+)   Abdominal   Peds  Hematology negative hematology ROS (+)   Anesthesia Other Findings IOL for PPROM  Reproductive/Obstetrics (+) Pregnancy                             Anesthesia Physical Anesthesia Plan  ASA: III  Anesthesia Plan: Epidural   Post-op Pain Management:    Induction:   PONV Risk Score and Plan: Treatment may vary due to age or medical condition  Airway Management Planned: Natural Airway  Additional Equipment:   Intra-op Plan:   Post-operative Plan:   Informed Consent: I have reviewed the patients History and Physical, chart, labs and discussed the procedure including the risks, benefits and alternatives for the proposed anesthesia with the patient or authorized representative who has indicated his/her understanding and acceptance.       Plan Discussed with: Anesthesiologist  Anesthesia Plan Comments: (Patient identified. Risks, benefits, options discussed with patient including but not limited to bleeding, infection, nerve damage, paralysis, failed block, incomplete pain control, headache, blood pressure  changes, nausea, vomiting, reactions to medication, itching, and post partum back pain. Confirmed with bedside nurse the patient's most recent platelet count. Confirmed with the patient that they are not taking any anticoagulation, have any bleeding history or any family history of bleeding disorders. Patient expressed understanding and wishes to proceed. All questions were answered. )        Anesthesia Quick Evaluation

## 2019-09-19 NOTE — Progress Notes (Signed)
Inpatient Diabetes Program Recommendations  Diabetes Treatment Program Recommendations  ADA Standards of Care 2018 Diabetes in Pregnancy Target Glucose Ranges:  Fasting: 60 - 90 mg/dL Preprandial: 60 - 154 mg/dL 1 hr postprandial: Less than 140mg /dL (from first bite of meal) 2 hr postprandial: Less than 120 mg/dL (from first bite of meal)    Lab Results  Component Value Date   GLUCAP 82 09/19/2019   HGBA1C 5.6 09/06/2019    Review of Glycemic Control Results for KALEAH, HAGEMEISTER (MRN [name]) as of 09/19/2019 08:41  Ref. Range 09/18/2019 20:00 09/18/2019 23:09 09/19/2019 06:25  Glucose-Capillary Latest Ref Range: 70 - 99 mg/dL 11/19/2019 (H) 093 (H) 82   Diabetes history: GDM  Current orders for Inpatient glycemic control: Metformin 500 mg BID, Novolog 0-14 units TID BMZ x2   Inpatient Diabetes Program Recommendations:    Noted transfer to L&D, may want to change correction to Q4H.  If CBGs exceed goal of 120 mg/dL, consider IV insulin.   Thanks, 267, MSN, RNC-OB Diabetes Coordinator [phone] (8a-5p)

## 2019-09-19 NOTE — Lactation Note (Addendum)
This note was copied from a baby's chart. Lactation Consultation Note  Patient Name: Monica Wolfe GHWEX'H Date: 09/19/2019 Reason for consult: NICU baby;Preterm <34wks;Maternal endocrine disorder Type of Endocrine Disorder?: Diabetes  Baby is 51 hours old preterm baby of a P4 mother with breastfeeding experience. Mother reports breastfeeding her other children for more than one year and has the intention of breast and formula feed this baby. Mother expressed interest in pumping to feed her baby. DEBP brought to room and explained pump basics/cleaning/frequency as well as assisted mother with pumping. Talked about milk storage and showed how to use yellow dots for colostrum. Colostrum collected on flange. Talked about breast massage and hand expression but mother is not interested at this time to learn. LC  asked mother about breast lesion mentioned in her chart and she clarified it was a lump near her armpit. Mother added that her physician had checked the lump and told her it was not concerning.   Reviewed Breastfeeding a NICU baby booklet and lactation services information with mother and encouraged to contact [facility] for support and questions.  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     Maternal Data Has patient been taught Hand Expression?: No Does the patient have breastfeeding experience prior to this delivery?: Yes   Interventions Interventions: DEBP;Breast feeding basics reviewed  Lactation Tools Discussed/Used Tools: Pump Breast pump type: Double-Electric Breast Pump WIC Program: Yes Pump Review: Setup, frequency, and cleaning;Milk Storage Initiated by:: Kashara Blocher Wolfe IBCLC Date initiated:: 09/19/19   Consult Status Consult Status: Follow-up Date: 09/20/19 Follow-up type: In-patient    Monica Wolfe Ancidey 09/19/2019, 8:55 PM

## 2019-09-19 NOTE — Anesthesia Procedure Notes (Signed)
Epidural Patient location during procedure: OB Start time: 09/19/2019 9:00 AM End time: 09/19/2019 9:15 AM  Staffing Anesthesiologist: Elmer Picker, MD Performed: anesthesiologist   Preanesthetic Checklist Completed: patient identified, IV checked, risks and benefits discussed, monitors and equipment checked, pre-op evaluation and timeout performed  Epidural Patient position: sitting Prep: DuraPrep and site prepped and draped Patient monitoring: continuous pulse ox, blood pressure, heart rate and cardiac monitor Approach: midline Location: L3-L4 Injection technique: LOR air  Needle:  Needle type: Tuohy  Needle gauge: 17 G Needle length: 9 cm Needle insertion depth: 8 cm Catheter type: closed end flexible Catheter size: 19 Gauge Catheter at skin depth: 13 cm Test dose: negative  Assessment Sensory level: T8 Events: blood not aspirated, injection not painful, no injection resistance, no paresthesia and negative IV test  Additional Notes Patient identified. Risks/Benefits/Options discussed with patient including but not limited to bleeding, infection, nerve damage, paralysis, failed block, incomplete pain control, headache, blood pressure changes, nausea, vomiting, reactions to medication both or allergic, itching and postpartum back pain. Confirmed with bedside nurse the patient's most recent platelet count. Confirmed with patient that they are not currently taking any anticoagulation, have any bleeding history or any family history of bleeding disorders. Patient expressed understanding and wished to proceed. All questions were answered. Sterile technique was used throughout the entire procedure. Please see nursing notes for vital signs. Test dose was given through epidural catheter and negative prior to continuing to dose epidural or start infusion. Warning signs of high block given to the patient including shortness of breath, tingling/numbness in hands, complete motor block,  or any concerning symptoms with instructions to call for help. Patient was given instructions on fall risk and not to get out of bed. All questions and concerns addressed with instructions to call with any issues or inadequate analgesia.  Reason for block:procedure for pain

## 2019-09-20 LAB — GLUCOSE, CAPILLARY
Glucose-Capillary: 132 mg/dL — ABNORMAL HIGH (ref 70–99)
Glucose-Capillary: 175 mg/dL — ABNORMAL HIGH (ref 70–99)
Glucose-Capillary: 178 mg/dL — ABNORMAL HIGH (ref 70–99)
Glucose-Capillary: 94 mg/dL (ref 70–99)

## 2019-09-20 NOTE — Clinical Social Work Maternal (Signed)
CLINICAL SOCIAL WORK MATERNAL/CHILD NOTE  Patient Details  Name: Monica Wolfe MRN: [id number] Date of Birth: 05/09/1986  Date:  09/20/2019  Clinical Social Worker Initiating Note:  Mariaelena Cade, LCSW Date/Time: Initiated:  09/20/19/1426     Child's Name:  Monica Wolfe   Biological Parents:  Mother, Father(Father: Monica Venancio Hernandez)   Need for Interpreter:  None   Reason for Referral:  Parental Support of Premature Babies < 32 weeks/or Critically Ill babies   Address:  [address] [place] [address]    Phone number:  [phone] (home)     Additional phone number:   Household Members/Support Persons (HM/SP):   Household Member/Support Person 1, Household Member/Support Person 2, Household Member/Support Person 3, Household Member/Support Person 4, Household Member/Support Person 5   HM/SP Name Relationship DOB or Age  HM/SP -1 Shirlen Ayala daughter 08/23/02  HM/SP -2 Angel Ayala son 05/22/05  HM/SP -3 Gohanna Ayala daughter 06/07/06  HM/SP -4 Axel Bedal son 09/29/08  HM/SP -5 Monica Venancio Hernandez FOB    HM/SP -6        HM/SP -7        HM/SP -8          Natural Supports (not living in the home):      Professional Supports: None   Employment: Unemployed   Type of Work:     Education:  High school graduate   Homebound arranged:    Financial Resources:  Medicaid   Other Resources:  WIC, Food Stamps    Cultural/Religious Considerations Which May Impact Care:    Strengths:  Ability to meet basic needs , Pediatrician chosen, Understanding of illness   Psychotropic Medications:         Pediatrician:    [place] area  Pediatrician List:   [facility]  [place]    [place]    [place]    [place]    [place]      Pediatrician Fax Number:    Risk Factors/Current Problems:  None   Cognitive State:  Able to Concentrate , Alert , Linear  Thinking , Goal Oriented    Mood/Affect:  Calm , Interested , Relaxed    CSW Assessment: CSW met with parents at infant's bedside to discuss infant's NICU admission. CSW asked if parents wanted an interpreter, MOB reported no. CSW introduced self and explained reason for visit. MOB was welcoming, pleasant and remained engaged during assessment. FOB appeared to be listening but did not participate in assessment. MOB reported that she is currently unemployed and receives both WIC and food stamps. MOB reported that she has bought a few items for infant but has to get more. MOB reported that she does not anticipate needing any assistance obtaining items for infant. CSW informed MOB about [facility] [employer] if needed. CSW inquired about MOB's support system, MOB reported that FOB is her only support. MOB reported that her mom is in [place].   RT entered to see infant.   CSW and MOB discussed infant's NICU admission. CSW informed parents about the NICU, what to expect and resources/supports available while infant is admitted to the NICU. MOB reported that they feel well informed about infant's care and denied any questions/concerns regarding the NICU. MOB denied any transportation barriers with coming to visit infant in the NICU.   CSW provided review of Sudden Infant Death Syndrome (SIDS) precautions. MOB verbalized understanding and reported that they have a crib for infant.     CSW agreed to meet with MOB at a later time to discuss mental health history and postpartum depression due to multiple people being present in the room, MOB agreeable.   CSW will meet with MOB at a later time to discuss mental health history and provide PMADs education.   CSW will continue to offer resources/supports while infant is admitted to the NICU.   CSW Plan/Description:  Sudden Infant Death Syndrome (SIDS) Education, Other Patient/Family Education    Casie Sturgeon L Nyilah Kight, LCSW 09/20/2019, 2:44  PM 

## 2019-09-20 NOTE — Anesthesia Postprocedure Evaluation (Signed)
Anesthesia Post Note  Patient: Monica Wolfe  Procedure(s) Performed: AN AD HOC LABOR EPIDURAL     Patient location during evaluation: Specials Recovery Anesthesia Type: Epidural Level of consciousness: awake and alert Pain management: pain level controlled Vital Signs Assessment: post-procedure vital signs reviewed and stable Respiratory status: spontaneous breathing, nonlabored ventilation and respiratory function stable Cardiovascular status: stable Postop Assessment: no headache, no backache, epidural receding, no apparent nausea or vomiting, able to ambulate and adequate PO intake Anesthetic complications: no    Last Vitals:  Vitals:   09/20/19 0414 09/20/19 0825  BP: 115/66 117/67  Pulse: 77 81  Resp: 18   Temp: 36.6 C   SpO2: 98%     Last Pain:  Vitals:   09/20/19 0800  TempSrc:   PainSc: Asleep   Pain Goal: Patients Stated Pain Goal: 2 (09/20/19 0011)                 Blythe Stanford

## 2019-09-20 NOTE — Progress Notes (Signed)
Post Partum Day 1 SVD 28 weeks d/t PROM Subjective: Pt sitting up breast pumping. No complaints. Ambulating, voiding, tolerating diet and good oral pain control  Objective: Blood pressure 117/67, pulse 81, temperature 98.2 F (36.8 C), temperature source Oral, resp. rate 18, height 5\' 6"  (1.676 m), weight 103 kg, last menstrual period 01/17/2019, SpO2 100 %, unknown if currently breastfeeding.  Physical Exam:  General: alert Lochia: appropriate Uterine Fundus: firm Incision: healing well DVT Evaluation: No evidence of DVT seen on physical exam.  Recent Labs    09/19/19 0726  HGB 11.6*  HCT 36.0    Assessment/Plan: Stable CBG[s elevated but received BMZ yesterday Continue with SSl and metformin Plan discharge tomorrow   LOS: 1 day   11/19/19 09/20/2019, 9:27 AM

## 2019-09-20 NOTE — Lactation Note (Signed)
This note was copied from a baby's chart. Lactation Consultation Note  Patient Name: Monica Wolfe [id number] Date: 09/20/2019 Reason for consult: Follow-up assessment;NICU baby;Preterm <34wks;Maternal endocrine disorder Type of Endocrine Disorder?: Diabetes  LC in to visit with P5 Mom of preterm infant in the NICU.  Baby is 22 hrs old.  Mom was set up with a DEBP yesterday and has been regularly pumping and hand expressing and massaging breasts.  Mom has been able to express some colostrum to take to baby.    Wrote pumping regime on dry erase board to assist Mom to remember to try to pump every 2-3 hrs during the day, and every 3-4 hrs at night.  Reviewed benefits of STS with baby, and frequent pumping.  Mom got a call from [facility] already and plans to obtain her pump on discharge possibly tomorrow.   Pump parts soaking in bin in sink.  Reminded parents that CDC doesn't recommend soaking longer than 5 mins.  Its preferable to wash, rinse and air dry right away after double pumping.  Reminded parents to disassemble pump parts.  Washed parts for parents along with teach as they are both tired this am.    No other questions voiced.  Reminded Mom of lactation support available IP and OP.   Interventions Interventions: Breast feeding basics reviewed;Skin to skin;Breast massage;Hand express;DEBP  Lactation Tools Discussed/Used Tools: Pump Breast pump type: Double-Electric Breast Pump   Consult Status Consult Status: Follow-up Date: 09/21/19 Follow-up type: In-patient    Monica Wolfe 09/20/2019, 10:33 AM

## 2019-09-21 LAB — GLUCOSE, CAPILLARY
Glucose-Capillary: 89 mg/dL (ref 70–99)
Glucose-Capillary: 76 mg/dL (ref 70–99)

## 2019-09-21 LAB — SURGICAL PATHOLOGY

## 2019-09-21 MED ORDER — IBUPROFEN 600 MG PO TABS: 600.0000 mg | ORAL_TABLET | Freq: Four times a day (QID) | ORAL | 1 refills | Status: DC

## 2019-09-21 MED ORDER — METFORMIN HCL 500 MG PO TABS: 500.0000 mg | ORAL_TABLET | Freq: Two times a day (BID) | ORAL | 1 refills | Status: DC

## 2019-09-21 NOTE — Discharge Instructions (Signed)

## 2019-09-21 NOTE — Progress Notes (Signed)
Discharge instructions given to patient. Discussed follow medication changes, follow up appointments, and postpartum care. Patient verbalized understanding.

## 2019-09-21 NOTE — Lactation Note (Signed)
This note was copied from a baby's chart. Lactation Consultation Note  Patient Name: Monica Wolfe [id number] Date: 09/21/2019 Reason for consult: Follow-up assessment;NICU baby   Mother stated she did not need Spanish interpreter.   P5, Baby 46 hours old in NICU.  Mother pumped 60 ml at 0900 today. Praised her for her efforts.  Mother has no questions or concerns.  She is pumping regularly and will pick up [facility] pump later today. Discussed engorgement care.  Recommend pumping at bedside.    Maternal Data    Feeding Feeding Type: Donor Breast Milk  LATCH Score                   Interventions Interventions: DEBP  Lactation Tools Discussed/Used     Consult Status Consult Status: Complete Date: 09/23/19    Dahlia Byes [facility] 09/21/2019, 9:47 AM

## 2019-09-28 ENCOUNTER — Encounter (HOSPITAL_COMMUNITY): Payer: Self-pay

## 2019-09-28 ENCOUNTER — Ambulatory Visit (HOSPITAL_COMMUNITY): Payer: Self-pay

## 2019-09-29 ENCOUNTER — Ambulatory Visit (INDEPENDENT_AMBULATORY_CARE_PROVIDER_SITE_OTHER): Payer: Self-pay | Admitting: *Deleted

## 2019-09-29 ENCOUNTER — Other Ambulatory Visit: Payer: Self-pay

## 2019-09-29 VITALS — BP 135/99 | HR 80 | Wt 213.9 lb

## 2019-09-29 DIAGNOSIS — I10 Essential (primary) hypertension: Secondary | ICD-10-CM

## 2019-09-29 MED ORDER — NIFEDIPINE ER OSMOTIC RELEASE 30 MG PO TB24: 30.0000 mg | ORAL_TABLET | Freq: Every day | ORAL | 0 refills | Status: DC

## 2019-09-29 NOTE — Patient Instructions (Signed)
May take immondium AD or kaopectate for diarrhea

## 2019-09-29 NOTE — Progress Notes (Signed)
Agree with A & P. 

## 2019-09-29 NOTE — Progress Notes (Signed)
Here for bp check s/p vaginal delivery 09/19/19 with HX Chtn. Not on any bp medications. Denies pain, headaches, edema. Is pumping- baby in NICU. BP elevated , discussed with Dr. Alysia Penna. Orders for Procardia 30XL given and bp check in 2 weeks. Explained to patient . Also advised if she has severe headache at home or sudden severe edema to go to hospital for evaluation. She also c/o diarrhea x 7 days.Discussed with Dr. Alysia Penna. Advised may take immodium AD or kaopectate . She voices understanding.  Aseel Uhde,RN

## 2019-10-08 ENCOUNTER — Ambulatory Visit: Payer: Self-pay

## 2019-10-08 NOTE — Lactation Note (Signed)
This note was copied from a baby's chart. Lactation Consultation Note  Patient Name: Monica Wolfe WUJWJ'X Date: 10/08/2019  Telephone call from RN that mom wants assistance with feeding. Rn reports that mom hasn't brought any milk to hospital in a few days. Mom pumping about about 60-90 mls or less per pump session.  LC did not let mom know what it is recommended .  However encourage mom not to go more than 4 hours without pumping and try to pump every 2 hours in the day and three hours at night and add massage and hand expression to pumping. Urged her to try and pump prior to feeding that it may make it easier for him to latch.  Mom requested assistance with feeding.   Mom put him in front of her and tried to latch him. He was very sleepy.  Did start cuing when put a small amount of breast milk on his lips to taste.  She has not pumped in four hours but reports her breasts are not full. Mom has medium to short nipples and infant has a hard time latching and staying latched with breastfeeding. Mom reports he usually does a little better.  Assisted mom to try and get infant on the breast a little deeper. He takes a few short sucking bursts of 3-5 or less And then holds nipple in mouth..  Quit assisting mom and just watched. She got him latched but not very deeply and he did the same thing and when he came off her nipple was slightly lipsticked. Urged her not to wear him out at the breast.  Urged her not to try more than 15 minutes at the breast right now .Urged to call lactation as needed. Maternal Data    Feeding Feeding Type: Breast Milk(Lactation Consult)  LATCH Score                   Interventions    Lactation Tools Discussed/Used     Consult Status      Monica Wolfe 10/08/2019, 4:06 PM

## 2019-10-10 ENCOUNTER — Ambulatory Visit: Payer: Self-pay

## 2019-10-10 NOTE — Lactation Note (Signed)
This note was copied from a baby's chart. Lactation Consultation Note  Patient Name: Boy Sima Lindenberger WUJWJ'X Date: 10/10/2019 Reason for consult: Follow-up assessment    LC Follow Up Visit:  Rounding in the NICU; RN informed me that this mother was visiting.  I found mother relaxed and holding her baby in her lap.  Mother had a few questions about pumping.  She is currently obtaining approximately 100-120 mls per pumping session.  She has the [facility] DEBP for home use.  We discussed continuing her plan of pumping at least every three hours and, time permitting, mother will pump every two hours.  However, this is not very often.  Encouraged continued breast massage and hand expression.  Discussed proper hydration, nutrition and rest.  Mother questioned me about using any "products" that may aid in milk production.  She also informed me that baby has latched to the breast in the past, however, he is usually too sleepy.  Reassured her that this is typical behavior and suggested lots of STS and putting EBM on his lips and her nipple to help him associate the EBM with feeding.  Reminded her of his gestational age and that he is currently only 27+64 weeks old today.  Praised mother's efforts and reassured her that we can assist with latching when both mother and baby are ready to feed in the days ahead.  Mother appreciative.      Consult Status Consult Status: PRN Date: 10/10/19 Follow-up type: Call as needed    Zully Frane R Grayton Lobo 10/10/2019, 11:51 AM

## 2019-10-13 ENCOUNTER — Ambulatory Visit: Payer: Self-pay

## 2019-10-13 ENCOUNTER — Other Ambulatory Visit: Payer: Self-pay

## 2019-10-13 VITALS — BP 138/102 | HR 70 | Wt 217.1 lb

## 2019-10-13 DIAGNOSIS — E119 Type 2 diabetes mellitus without complications: Secondary | ICD-10-CM

## 2019-10-13 NOTE — Progress Notes (Signed)
Pt here today for BP check s/p starting Procardia 30 mg tablet for cHTN.  With Spanish Interpreter Eda R.,  BP 132/100 RA.  Pt reports that she has headache intermittently.  BP recheck 138/102 LA. Pt also c/o diarrhea that she continues to be ongoing even though she has taken Imodium.  I explained to the pt that Metformin can cause some patients to have diarrhea whole taking the medication. Reviewed BP values, that pt has not taken BP medication this am, and pt having diarrhea taking Metformin to Dr. Shawnie Pons.  Provider recommends that pt goes home to take Procardia recheck blood pressure at home and call it into the office.  Pt is to continue with current dose of Procardia and that we will evaluate at her pp visit on 10/27/19 unless she has sx's prior to appt.  Dr. Shawnie Pons recommends that pt discontinue the use of Metformin and pt will have her 2 hr gtt scheduled to make sure that she is not a diabetic.  Pt verbalized understanding.   Addison Naegeli, RN

## 2019-10-19 ENCOUNTER — Other Ambulatory Visit: Payer: Self-pay

## 2019-10-22 ENCOUNTER — Ambulatory Visit: Payer: Self-pay

## 2019-10-22 NOTE — Lactation Note (Signed)
This note was copied from a baby's chart. Lactation Consultation Note  Patient Name: Monica Wolfe [id number] Date: 10/22/2019 Reason for consult: Follow-up assessment;NICU baby;Preterm <34wks;Maternal endocrine disorder Type of Endocrine Disorder?: Diabetes  RN requests LC talk with Mom about pumping.  Mom had not been pumping last week, and baby was being supplemented with formula by bottle/gavage.    Mom had baby STS on her chest sleeping.    Mom interested in starting to pump again.  Pump set up in baby's room.  Encouraged Mom to double pump after baby breastfeeds or every 3 hrs for 15-30 mins.    Offered Mom to assist/assess with a feeding.  Mom states baby is latching well.   Mom to ask her nurse to call LC prn.    Interventions Interventions: Breast feeding basics reviewed;Skin to skin;Breast massage;Hand express;DEBP  Lactation Tools Discussed/Used Tools: Pump;Flanges Flange Size: 27 Breast pump type: Double-Electric Breast Pump Pump Review: Setup, frequency, and cleaning;Milk Storage Initiated by:: C Lauryl Seyer RN IBCLC Date initiated:: 10/22/19(Mom hasn't pumped in a week, but is showing desire to now.)   Consult Status Consult Status: Follow-up Date: 10/24/19 Follow-up type: In-patient    Judee Clara 10/22/2019, 8:56 AM

## 2019-10-27 ENCOUNTER — Encounter: Payer: Self-pay | Admitting: Obstetrics and Gynecology

## 2019-10-27 ENCOUNTER — Other Ambulatory Visit: Payer: Self-pay

## 2019-10-27 ENCOUNTER — Ambulatory Visit (INDEPENDENT_AMBULATORY_CARE_PROVIDER_SITE_OTHER): Payer: Self-pay | Admitting: Obstetrics and Gynecology

## 2019-10-27 DIAGNOSIS — O2413 Pre-existing diabetes mellitus, type 2, in the puerperium: Secondary | ICD-10-CM

## 2019-10-27 DIAGNOSIS — E119 Type 2 diabetes mellitus without complications: Secondary | ICD-10-CM

## 2019-10-27 DIAGNOSIS — I10 Essential (primary) hypertension: Secondary | ICD-10-CM

## 2019-10-27 DIAGNOSIS — Z3202 Encounter for pregnancy test, result negative: Secondary | ICD-10-CM

## 2019-10-27 DIAGNOSIS — E1169 Type 2 diabetes mellitus with other specified complication: Secondary | ICD-10-CM

## 2019-10-27 DIAGNOSIS — O1093 Unspecified pre-existing hypertension complicating the puerperium: Secondary | ICD-10-CM

## 2019-10-27 DIAGNOSIS — Z3009 Encounter for other general counseling and advice on contraception: Secondary | ICD-10-CM

## 2019-10-27 DIAGNOSIS — Z603 Acculturation difficulty: Secondary | ICD-10-CM

## 2019-10-27 DIAGNOSIS — Z1389 Encounter for screening for other disorder: Secondary | ICD-10-CM

## 2019-10-27 LAB — POCT PREGNANCY, URINE: Preg Test, Ur: NEGATIVE

## 2019-10-27 NOTE — Progress Notes (Signed)
Pt undecided about BC, May want Nexplanon.

## 2019-10-27 NOTE — Progress Notes (Signed)
error 

## 2019-10-27 NOTE — Progress Notes (Signed)
Obstetrics/Postpartum Visit  Appointment Date: 10/27/2019  OBGYN Clinic: [facility]  Primary Care Provider: Department, [facility]  Chief Complaint:  Chief Complaint  Patient presents with  . Postpartum Care    History of Present Illness: Monica Wolfe is a 34 y.o. Hispanic [phone] (Patient's last menstrual period was 01/17/2019.), seen for the above chief complaint. Her past medical history is significant for cHTN, T2DM.  She is s/p svd on 09/19/19 at 30 weeks, for PPROM; she was discharged to home on PPD#2. Pregnancy complicated by PPROM @ 28 weeks.  Complains of not getting enough sleep. Feels like she is coping well.   Vaginal bleeding or discharge: No  Breast or formula feeding: breast and bottle Intercourse: No  Contraception: wants to talk about nexplanon PP depression s/s: No  Any bowel or bladder issues: No  Pap smear: done at [facility], don't have records   Review of Systems: Positive for n/a.   Her 12 point review of systems is negative or as noted in the History of Present Illness.  Patient Active Problem List   Diagnosis Date Noted  . Preterm labor in third trimester 09/19/2019  . Preterm delivery   . Preterm premature rupture of membranes (PPROM) with onset of labor after 24 hours of rupture in third trimester, antepartum 09/07/2019  . Diabetes mellitus without complication (HCC)   . COVID-19 06/18/2019  . Supervision of high risk pregnancy, antepartum 05/10/2019  . Chronic hypertension 05/10/2019  . Breast lesion 05/10/2019  . H/O postpartum hemorrhage, currently pregnant 05/10/2019  . Language barrier 05/10/2019  . Bilateral knee pain 07/19/2015  . Health care maintenance 07/19/2015   Medications Dominika Avelina Laine had no medications administered during this visit. Current Outpatient Medications  Medication Sig Dispense Refill  . metFORMIN (GLUCOPHAGE) 500 MG tablet Take 1 tablet (500 mg total) by mouth 2 (two)  times daily with a meal. 60 tablet 1  . NIFEdipine (PROCARDIA XL) 30 MG 24 hr tablet Take 1 tablet (30 mg total) by mouth daily. 30 tablet 0  . Prenatal Vit-Fe Fumarate-FA (PRENATAL VITAMINS) 28-0.8 MG TABS Take 1 tablet by mouth daily. 30 tablet 6   No current facility-administered medications for this visit.   Allergies Patient has no known allergies.  Physical Exam:  BP 128/84   Pulse 82   Ht 5\' 6"  (1.676 m)   Wt 216 lb 8 oz (98.2 kg)   LMP 01/17/2019   BMI 34.94 kg/m  Body mass index is 34.94 kg/m. General appearance: Well nourished, well developed female in no acute distress.  Cardiovascular: regular rate and rhythm Respiratory: Normal respiratory effort Abdomen: no masses, hernias; diffusely non tender to palpation, non distended,  Breasts: not examined. Neuro/Psych:  Normal mood and affect.  Skin:  Warm and dry.   PP Depression Screening:   Edinburgh Postnatal Depression Scale - 10/27/19 1118      Edinburgh Postnatal Depression Scale:  In the Past 7 Days   I have been able to laugh and see the funny side of things.  0    I have looked forward with enjoyment to things.  0    I have blamed myself unnecessarily when things went wrong.  0    I have been anxious or worried for no good reason.  0    I have felt scared or panicky for no good reason.  0    Things have been getting on top of me.  0  I have been so unhappy that I have had difficulty sleeping.  0    I have felt sad or miserable.  0    I have been so unhappy that I have been crying.  0    The thought of harming myself has occurred to me.  0    Edinburgh Postnatal Depression Scale Total  0       Assessment: Patient is a 34 y.o. G1W2993 who is 5 weeks post partum from a SVD at 30 weeks. She is doing well.   Plan:   1. Postpartum state Doing well Patient has not had COVID vaccine. Reviewed risks/benefits in pregnancy/breastfeeding and gave info. She will consider and call with questions.  2. Chronic  hypertension Cont procardia, return to PCP  3. Type 2 diabetes mellitus with other specified complication, unspecified whether long term insulin use (HCC) 2 hr GTT today  4. Preterm delivery  5. Encounter for counseling regarding contraception Reviewed options for contraception, nexplanon and IUD in detail Desires IUD, will do at [place] components of care per ACOG recommendations:  1.  Mood and well being: Patient with negative depression screening today. Reviewed local resources for support.  - Patient does not use tobacco.  - hx of drug use? No    2. Infant care and feeding:  -Patient currently breastmilk feeding? Yes If breastmilk feeding discussed return to work and pumping. If needed, patient was provided letter for work to allow for every 2-3 hr pumping breaks, and to be granted a private location to express breastmilk and refrigerated area to store breastmilk. Reviewed importance of draining breast regularly to support lactation. -Social determinants of health (SDOH) reviewed in EPIC.  3. Sexuality, contraception and birth spacing - Patient does not want a pregnancy in the next year.  - Reviewed forms of contraception in tiered fashion. Patient desired IUD today.    4. Sleep and fatigue -Encouraged family/partner/community support of 4 hrs of uninterrupted sleep to help with mood and fatigue  5. Physical Recovery  - Discussed patients delivery and complications - Patient has urinary incontinence? No  - Patient is safe to resume physical and sexual activity  6.  Health Maintenance - Last pap smear done at [facility], need to obtain records  7. DM - PCP follow up   RTC prn   K. Arvilla Meres, M.D. Attending Center for [employer] Fish farm manager)

## 2019-10-28 ENCOUNTER — Telehealth: Payer: Self-pay

## 2019-10-28 LAB — GLUCOSE TOLERANCE, 2 HOURS
Glucose, 2 hour: 162 mg/dL — ABNORMAL HIGH (ref 65–139)
Glucose, GTT - Fasting: 88 mg/dL (ref 65–99)

## 2019-10-28 NOTE — Telephone Encounter (Signed)
-----   Message from Conan Bowens, MD sent at 10/28/2019 10:31 AM EDT ----- Elevated postpartum GTT, needs to go to PCP or get referral, please let her know

## 2019-10-28 NOTE — Telephone Encounter (Signed)
Called patient with GTT Results advised to see a primary care provider for care. Patient verbalized understanding and ended the call. Used pacific interpreters to make call.

## 2020-07-18 ENCOUNTER — Other Ambulatory Visit: Payer: Self-pay

## 2020-07-18 ENCOUNTER — Ambulatory Visit
Admission: RE | Admit: 2020-07-18 | Discharge: 2020-07-18 | Disposition: A | Discharge status: Home / Self Care | Payer: Self-pay | Source: Ambulatory Visit | Attending: Nurse Practitioner | Admitting: Nurse Practitioner

## 2020-07-18 ENCOUNTER — Other Ambulatory Visit: Payer: Self-pay | Admitting: Nurse Practitioner

## 2020-07-18 DIAGNOSIS — R52 Pain, unspecified: Secondary | ICD-10-CM

## 2022-06-09 DIAGNOSIS — O24419 Gestational diabetes mellitus in pregnancy, unspecified control: Secondary | ICD-10-CM

## 2022-06-09 HISTORY — DX: Gestational diabetes mellitus in pregnancy, unspecified control: O24.419

## 2022-06-09 NOTE — L&D Delivery Note (Signed)
OB/GYN Faculty Practice Delivery Note  Monica Wolfe is a 37 y.o. [id number] s/p SVD at [redacted]w[redacted]d. She was admitted for IOL for cHTN, A2GDM.   ROM: 5h 34m with clear fluid GBS Status:  negative Maximum Maternal Temperature: Temp (24hrs), Avg:98.4 F (36.9 C), Min:98 F (36.7 C), Max:98.7 F (37.1 C)   Labor Progress: Initial SVE: 3.5/50/-2. AROM and Pitocin required. She then progressed to complete.   Delivery Date/Time: 04/02/23 2224 Delivery: Called to room and patient was feeling increased pelvic pressure. Found to be 10/100/+1. Patient pushed quickly and delivered within 1 contraction. Head delivered LOA. Nuchal present x1. Shoulder and body delivered in usual fashion. Infant with spontaneous cry, placed on mother's abdomen, dried and stimulated. Cord clamped x 2 after 1-minute delay, and cut by FOB. Cord blood drawn. Placenta delivered spontaneously with gentle cord traction. Pitocin started after delivery of babe, and TXA given for grand multiparity with other risk factors for hemorrhage as well. Labia, perineum, and vagina inspected with no lacerations.   Code hemorrhage: patient with ~400 cc blood loss after delivery of placenta. Uterus intermittently boggy, so fundal massage continued. Gush of blood noted. Examined vagina again to ensure tear was not causing bleeding. Cytotec 800 mcg given rectally while fundal massage continued. Dr. Despina Hidden called to room as blood loss had reached ~700 cc at that time. Methergine given. (BP wnl after delivery.) Uterine sweep performed with several large clots swept out. Uterine tone still poor without massage, so decision to insert Mel Almond was made. Jada placed into the uterus, and vaginal balloon inflated to 120 cc without difficulty and hooked up to suction. Ancef 2g given for uterine sweep. Foley catheter placed to keep bladder decompressed. Plan to leave Mel Almond in place for 2 hours. Will then deflate balloon and reassess after additional hour. CBC  ordered. Patient HDS with babe skin-to-skin upon my departure from the room.  Baby Weight: pending  Placenta: 3 vessel, intact. Sent to L&D Complications: PPH Lacerations: none QBL: 1574 mL Anesthesia: epidural  Infant:  APGAR (1 MIN): 8  APGAR (5 MINS): 9  APGAR (10 MINS):    Joanne Gavel, MD [facility] Family Medicine Fellow, [facility] for [facility], [facility] 04/02/2023, 11:38 PM

## 2022-10-13 LAB — LEAD, BLOOD

## 2022-10-13 LAB — OB RESULTS CONSOLE HGB/HCT, BLOOD
HCT: 37 (ref 29–41)
Hemoglobin: 12.3

## 2022-10-13 LAB — OB RESULTS CONSOLE RUBELLA ANTIBODY, IGM: Rubella: IMMUNE

## 2022-10-13 LAB — OB RESULTS CONSOLE ABO/RH: RH Type: POSITIVE

## 2022-10-13 LAB — HEPATITIS C ANTIBODY: HCV Ab: NEGATIVE

## 2022-10-13 LAB — OB RESULTS CONSOLE HIV ANTIBODY (ROUTINE TESTING): HIV: NONREACTIVE

## 2022-10-13 LAB — OB RESULTS CONSOLE PLATELET COUNT: Platelets: 249

## 2022-10-13 LAB — OB RESULTS CONSOLE GC/CHLAMYDIA
Chlamydia: NEGATIVE
Neisseria Gonorrhea: NEGATIVE

## 2022-10-13 LAB — GLUCOSE TOLERANCE, 1 HOUR: Glucose, 1 Hour GTT: 175

## 2022-10-13 LAB — OB RESULTS CONSOLE ANTIBODY SCREEN: Antibody Screen: NEGATIVE

## 2022-10-13 LAB — AFP, SERUM, OPEN SPINA BIFIDA: AFP, Serum: NEGATIVE

## 2022-10-13 LAB — OB RESULTS CONSOLE RPR: RPR: NONREACTIVE

## 2022-10-13 LAB — OB RESULTS CONSOLE HEPATITIS B SURFACE ANTIGEN: Hepatitis B Surface Ag: NEGATIVE

## 2022-10-13 LAB — HEP B SURFACE ANTIGEN QUANT: Hepatitis B Surface Antigen: NONREACTIVE

## 2022-10-31 ENCOUNTER — Encounter: Payer: Self-pay | Admitting: Family Medicine

## 2022-10-31 ENCOUNTER — Ambulatory Visit (INDEPENDENT_AMBULATORY_CARE_PROVIDER_SITE_OTHER): Payer: Self-pay | Admitting: Family Medicine

## 2022-10-31 ENCOUNTER — Other Ambulatory Visit: Payer: Self-pay

## 2022-10-31 VITALS — BP 128/87 | HR 74 | Wt 220.0 lb

## 2022-10-31 DIAGNOSIS — O0992 Supervision of high risk pregnancy, unspecified, second trimester: Secondary | ICD-10-CM

## 2022-10-31 DIAGNOSIS — N390 Urinary tract infection, site not specified: Secondary | ICD-10-CM

## 2022-10-31 DIAGNOSIS — Z3A19 19 weeks gestation of pregnancy: Secondary | ICD-10-CM

## 2022-10-31 DIAGNOSIS — Z758 Other problems related to medical facilities and other health care: Secondary | ICD-10-CM

## 2022-10-31 DIAGNOSIS — Z8632 Personal history of gestational diabetes: Secondary | ICD-10-CM

## 2022-10-31 DIAGNOSIS — Z603 Acculturation difficulty: Secondary | ICD-10-CM

## 2022-10-31 DIAGNOSIS — O24419 Gestational diabetes mellitus in pregnancy, unspecified control: Secondary | ICD-10-CM | POA: Insufficient documentation

## 2022-10-31 DIAGNOSIS — O10919 Unspecified pre-existing hypertension complicating pregnancy, unspecified trimester: Secondary | ICD-10-CM | POA: Insufficient documentation

## 2022-10-31 DIAGNOSIS — Z8751 Personal history of pre-term labor: Secondary | ICD-10-CM

## 2022-10-31 DIAGNOSIS — O0990 Supervision of high risk pregnancy, unspecified, unspecified trimester: Secondary | ICD-10-CM | POA: Insufficient documentation

## 2022-10-31 DIAGNOSIS — Z641 Problems related to multiparity: Secondary | ICD-10-CM

## 2022-10-31 DIAGNOSIS — O10912 Unspecified pre-existing hypertension complicating pregnancy, second trimester: Secondary | ICD-10-CM

## 2022-10-31 NOTE — Progress Notes (Signed)
   PRENATAL VISIT NOTE  Subjective:  Monica Wolfe is a 37 y.o. [phone] at [redacted]w[redacted]d being seen today for transferring prenatal care from [facility].  She is currently monitored for the following issues for this high-risk pregnancy and has Chronic hypertension; Breast lesion; Language barrier; History of gestational diabetes; History of preterm delivery; Supervision of high risk pregnancy, antepartum; Chronic hypertension affecting pregnancy; Kidney stone; Recurrent UTI; and Grand multipara on their problem list.  Patient reports no complaints.  Contractions: Not present. Vag. Bleeding: None.  Movement: Present. Denies leaking of fluid.   The following portions of the patient's history were reviewed and updated as appropriate: allergies, current medications, past family history, past medical history, past social history, past surgical history and problem list.   Objective:   Vitals:   10/31/22 1026 10/31/22 1105  BP: (!) 137/93 128/87  Pulse: 76 74  Weight: 220 lb (99.8 kg)     Fetal Status: Fetal Heart Rate (bpm): 145   Movement: Present     General:  Alert, oriented and cooperative. Patient is in no acute distress.  Skin: Skin is warm and dry. No rash noted.   Cardiovascular: Normal heart rate noted  Respiratory: Normal respiratory effort, no problems with respiration noted  Abdomen: Soft, gravid, appropriate for gestational age.  Pain/Pressure: Absent     Pelvic: Cervical exam deferred        Extremities: Normal range of motion.  Edema: None  Mental Status: Normal mood and affect. Normal behavior. Normal judgment and thought content.   Assessment and Plan:  Pregnancy: [id number] at [redacted]w[redacted]d 1. History of gestational diabetes Early 1 hour is 175-->needs 2 hour  2. History of preterm delivery ? Due to stress. Has h/o 4 term SVDs prior to that  3. Supervision of high risk pregnancy, antepartum - [place] MFM OB DETAIL +14 WK; Future  4. Chronic hypertension affecting pregnancy On  Procardia 30 and ASA May need to increase her BP med if BPs remain elevated  5. Language barrier Spanish interpreter: Debarah Crape used  6. Recurrent UTI Check culture, reports dysuria following intercourse, could consider ppx. - Culture, OB Urine  7. Grand multipara At risk of [facility]  General obstetric precautions including but not limited to vaginal bleeding, contractions, leaking of fluid and fetal movement were reviewed in detail with the patient. Please refer to After Visit Summary for other counseling recommendations.   Return in about 4 weeks (around 11/28/2022) for [facility], needs 2 hour ASAP.  Future Appointments  Date Time Provider Department Center  11/28/2022  8:20 AM [facility] LAB [facility]  11/28/2022 10:15 AM Venora Maples, MD [facility]  12/02/2022  9:30 AM [facility] NURSE [facility]  12/02/2022  9:45 AM [facility] US6 [facility]    Reva Bores, MD

## 2022-11-02 LAB — URINE CULTURE, OB REFLEX

## 2022-11-02 LAB — CULTURE, OB URINE

## 2022-11-05 ENCOUNTER — Encounter: Payer: Self-pay | Admitting: *Deleted

## 2022-11-26 DIAGNOSIS — O99210 Obesity complicating pregnancy, unspecified trimester: Secondary | ICD-10-CM | POA: Insufficient documentation

## 2022-11-27 ENCOUNTER — Other Ambulatory Visit: Payer: Self-pay

## 2022-11-27 DIAGNOSIS — R7309 Other abnormal glucose: Secondary | ICD-10-CM

## 2022-11-27 DIAGNOSIS — Z8632 Personal history of gestational diabetes: Secondary | ICD-10-CM

## 2022-11-28 ENCOUNTER — Encounter: Payer: Self-pay | Admitting: Family Medicine

## 2022-11-28 ENCOUNTER — Other Ambulatory Visit: Payer: Self-pay

## 2022-11-28 ENCOUNTER — Ambulatory Visit (INDEPENDENT_AMBULATORY_CARE_PROVIDER_SITE_OTHER): Payer: Self-pay | Admitting: Family Medicine

## 2022-11-28 VITALS — BP 127/84 | HR 87 | Wt 223.8 lb

## 2022-11-28 DIAGNOSIS — Z3A23 23 weeks gestation of pregnancy: Secondary | ICD-10-CM

## 2022-11-28 DIAGNOSIS — Z603 Acculturation difficulty: Secondary | ICD-10-CM

## 2022-11-28 DIAGNOSIS — Z8632 Personal history of gestational diabetes: Secondary | ICD-10-CM

## 2022-11-28 DIAGNOSIS — Z641 Problems related to multiparity: Secondary | ICD-10-CM

## 2022-11-28 DIAGNOSIS — O0992 Supervision of high risk pregnancy, unspecified, second trimester: Secondary | ICD-10-CM

## 2022-11-28 DIAGNOSIS — Z758 Other problems related to medical facilities and other health care: Secondary | ICD-10-CM

## 2022-11-28 DIAGNOSIS — Z8751 Personal history of pre-term labor: Secondary | ICD-10-CM

## 2022-11-28 DIAGNOSIS — O10912 Unspecified pre-existing hypertension complicating pregnancy, second trimester: Secondary | ICD-10-CM

## 2022-11-28 DIAGNOSIS — R7309 Other abnormal glucose: Secondary | ICD-10-CM

## 2022-11-28 DIAGNOSIS — O99212 Obesity complicating pregnancy, second trimester: Secondary | ICD-10-CM

## 2022-11-28 DIAGNOSIS — N390 Urinary tract infection, site not specified: Secondary | ICD-10-CM

## 2022-11-28 DIAGNOSIS — O10919 Unspecified pre-existing hypertension complicating pregnancy, unspecified trimester: Secondary | ICD-10-CM

## 2022-11-28 DIAGNOSIS — O0990 Supervision of high risk pregnancy, unspecified, unspecified trimester: Secondary | ICD-10-CM

## 2022-11-28 NOTE — Progress Notes (Signed)
   Subjective:  Monica Wolfe is a 37 y.o. [phone] at [redacted]w[redacted]d being seen today for ongoing prenatal care.  She is currently monitored for the following issues for this high-risk pregnancy and has Language barrier; History of gestational diabetes; History of preterm delivery; Supervision of high risk pregnancy, antepartum; Chronic hypertension affecting pregnancy; Kidney stone; Recurrent UTI; Grand multipara; and Obesity affecting pregnancy on their problem list.  Patient reports no complaints.  Contractions: Not present. Vag. Bleeding: None.  Movement: Present. Denies leaking of fluid.   The following portions of the patient's history were reviewed and updated as appropriate: allergies, current medications, past family history, past medical history, past social history, past surgical history and problem list. Problem list updated.  Objective:   Vitals:   11/28/22 0915  BP: 127/84  Pulse: 87  Weight: 223 lb 12.8 oz (101.5 kg)    Fetal Status: Fetal Heart Rate (bpm): 135   Movement: Present     General:  Alert, oriented and cooperative. Patient is in no acute distress.  Skin: Skin is warm and dry. No rash noted.   Cardiovascular: Normal heart rate noted  Respiratory: Normal respiratory effort, no problems with respiration noted  Abdomen: Soft, gravid, appropriate for gestational age. Pain/Pressure: Absent     Pelvic: Vag. Bleeding: None     Cervical exam deferred        Extremities: Normal range of motion.  Edema: None  Mental Status: Normal mood and affect. Normal behavior. Normal judgment and thought content.   Urinalysis:      Assessment and Plan:  Pregnancy: [id number] at [redacted]w[redacted]d  1. Supervision of high risk pregnancy, antepartum BP and FHR normal Getting 2hr GTT today due to abnormal 1hr GTT at [facility] Has anatomy scan scheduled for next week on 12/02/2022 Having some round ligament pain, reassured this is normal  2. Chronic hypertension affecting pregnancy Normotensive on  Nifedipine 30 XL On ASA Will follow w MFM  3. Recurrent UTI Ucx normal from last visit  4. Obesity affecting pregnancy in second trimester, unspecified obesity type   5. Language barrier Spanish  6. History of preterm delivery PPROM at 28 wks last pregnancy, I attended that delivery at 30 weeks Remainder have been term  7. History of gestational diabetes 2hr GTT today  8. Grand multipara Plan for TXA at delivery  Preterm labor symptoms and general obstetric precautions including but not limited to vaginal bleeding, contractions, leaking of fluid and fetal movement were reviewed in detail with the patient. Please refer to After Visit Summary for other counseling recommendations.  Return in 4 weeks (on 12/26/2022) for [facility], ob visit, 28 wk labs, needs MD.   Venora Maples, MD

## 2022-11-28 NOTE — Patient Instructions (Signed)
Eleccin del mtodo anticonceptivo Contraception Choices La anticoncepcin, o los mtodos anticonceptivos, hace referencia a los mtodos o dispositivos que evitan el embarazo. Mtodos hormonales  Implante anticonceptivo Un implante anticonceptivo consiste en un tubo delgado de plstico que contiene una hormona que evita el embarazo. Es diferente de un dispositivo intrauterino (DIU). Un mdico lo inserta en la parte superior del brazo. Los implantes pueden ser eficaces durante un mximo de 3 aos. Inyecciones de progestina sola Las inyecciones de progestina sola contienen progestina, una forma sinttica de la hormona progesterona. Un mdico las administra cada 3 meses. Pldoras anticonceptivas Las pldoras anticonceptivas son pastillas que contienen hormonas que evitan el embarazo. Deben tomarse una vez al da, preferentemente a la misma hora cada da. Se necesita una receta para utilizar este mtodo anticonceptivo. Parche anticonceptivo El parche anticonceptivo contiene hormonas que evitan el embarazo. Se coloca en la piel, debe cambiarse una vez a la semana durante tres semanas y debe retirarse en la cuarta semana. Se necesita una receta para utilizar este mtodo anticonceptivo. Anillo vaginal Un anillo vaginal contiene hormonas que evitan el embarazo. Se coloca en la vagina durante tres semanas y se retira en la cuarta semana. Luego se repite el proceso con un anillo nuevo. Se necesita una receta para utilizar este mtodo anticonceptivo. Anticonceptivo de emergencia Los anticonceptivos de emergencia son mtodos para evitar un embarazo despus de tener sexo sin proteccin. Vienen en forma de pldora y pueden tomarse hasta 5 das despus de tener sexo. Funcionan mejor cuando se toman lo ms pronto posible luego de tener sexo. La mayora de los anticonceptivos de emergencia estn disponibles sin receta mdica. Este mtodo no debe utilizarse como el nico mtodo anticonceptivo. Mtodos de  barrera  Condn masculino Un condn masculino es una vaina delgada que se coloca sobre el pene durante el sexo. Los condones evitan que el esperma ingrese en el cuerpo de la mujer. Pueden utilizarse con un una sustancia que mata a los espermatozoides (espermicida) para aumentar la efectividad. Deben desecharse despus de un uso. Condn femenino Un condn femenino es una vaina blanda y holgada que se coloca en la vagina antes de tener sexo. El condn evita que el esperma ingrese en el cuerpo de la mujer. Deben desecharse despus de un uso. Diafragma Un diafragma es una barrera blanda con forma de cpula. Se inserta en la vagina antes del sexo, junto con un espermicida. El diafragma bloquea el ingreso de esperma en el tero, y el espermicida mata a los espermatozoides. El diafragma debe permanecer en la vagina durante 6 a 8 horas despus de tener sexo y debe retirarse en el plazo de las 24 horas. Un diafragma es recetado y colocado por un mdico. Debe reemplazarse cada 1 a 2 aos, despus de dar a luz, de aumentar ms de 15lb (6.8kg) y de una ciruga plvica. Capuchn cervical Un capuchn cervical es una copa redonda y blanda de ltex o plstico que se coloca en el cuello uterino. Se inserta en la vagina antes del sexo, junto con un espermicida. Bloquea el ingreso del esperma en el tero. El capuchn debe permanecer en el lugar durante 6 a 8 horas despus de tener sexo y debe retirarse en el plazo de las 48 horas. Un capuchn cervical debe ser recetado y colocado por un mdico. Debe reemplazarse cada 2aos. Esponja Una esponja es una pieza blanda y circular de espuma de poliuretano que contiene espermicida. La esponja ayuda a bloquear el ingreso de esperma en el tero, y el espermicida   mata a los espermatozoides. Para utilizarla, debe humedecerla e insertarla en la vagina. Debe insertarse antes de tener sexo, debe permanecer dentro al menos durante 6 horas despus de tener sexo y debe retirarse y  desecharse en el plazo de las 30 horas. Espermicidas Los espermicidas son sustancias qumicas que matan o bloquean al esperma y no lo dejan ingresar al cuello uterino y al tero. Vienen en forma de crema, gel, supositorio, espuma o comprimido. Un espermicida debe insertarse en la vagina con un aplicador al menos 10 o 15 minutos antes de tener sexo para dar tiempo a que surta efecto. El proceso debe repetirse cada vez que tenga sexo. Los espermicidas no requieren receta mdica. Anticonceptivos intrauterinos Dispositivo intrauterino (DIU) Un DIU es un dispositivo en forma de T que se coloca en el tero. Existen dos tipos: DIU hormonal.Este tipo contiene progestina, una forma sinttica de la hormona progesterona. Este tipo puede permanecer colocado durante 3 a 5 aos. DIU de cobre.Este tipo est recubierto con un alambre de cobre. Puede permanecer colocado durante 10 aos. Mtodos anticonceptivos permanentes Ligadura de trompas en la mujer En este mtodo, se sellan, atan u obstruyen las trompas de Falopio durante una ciruga para evitar que el vulo descienda hacia el tero. Esterilizacin histeroscpica En este mtodo, se coloca un implante pequeo y flexible dentro de cada trompa de Falopio. Los implantes hacen que se forme un tejido cicatricial en las trompas de Falopio y que las obstruya para que el espermatozoide no pueda llegar al vulo. El procedimiento demora alrededor de 3 meses para que sea efectivo. Debe utilizarse otro mtodo anticonceptivo durante esos 3 meses. Esterilizacin masculina Este es un procedimiento que consiste en atar los conductos que transportan el esperma (vasectoma). Luego del procedimiento, el hombre puede eyacular lquido (semen). Debe utilizarse otro mtodo anticonceptivo durante 3 meses despus del procedimiento. Mtodos de planificacin natural Planificacin familiar natural En este mtodo, la pareja no tiene sexo durante los das en que la mujer podra quedar  embarazada. Mtodo calendario En este mtodo, la mujer realiza un seguimiento de la duracin de cada ciclo menstrual, identifica los das en los que se puede producir un embarazo y no tiene sexo durante esos das. Mtodo de la ovulacin En este mtodo, la pareja evita tener sexo durante la ovulacin. Mtodo sintotrmico Este mtodo implica no tener sexo durante la ovulacin. Normalmente, la mujer comprueba la ovulacin al observar cambios en su temperatura y en la consistencia del moco cervical. Mtodo posovulacin En este mtodo, la pareja espera a que finalice la ovulacin para tener sexo. Dnde buscar ms informacin Centers for Disease Control and Prevention (Centros para el Control y la Prevencin de Enfermedades): www.cdc.gov Resumen La anticoncepcin, o los mtodos anticonceptivos, hace referencia a los mtodos o dispositivos que evitan el embarazo. Los mtodos anticonceptivos hormonales incluyen implantes, inyecciones, pastillas, parches, anillos vaginales y anticonceptivos de emergencia. Los mtodos anticonceptivos de barrera pueden incluir condones masculinos, condones femeninos, diafragmas, capuchones cervicales, esponjas y espermicidas. Existen dos tipos de DIU (dispositivo intrauterino). Un DIU puede colocarse en el tero de una mujer para evitar el embarazo durante 3 a 5 aos. La esterilizacin permanente puede realizarse mediante un procedimiento tanto en los hombres como en las mujeres. Los mtodos de planificacin familiar natural implican no tener sexo durante los das en que la mujer podra quedar embarazada. Esta informacin no tiene como fin reemplazar el consejo del mdico. Asegrese de hacerle al mdico cualquier pregunta que tenga. Document Revised: 12/27/2019 Document Reviewed: 12/27/2019 Elsevier Patient Education    2024 Elsevier Inc.  

## 2022-11-29 LAB — GLUCOSE TOLERANCE, 2 HOURS W/ 1HR
Glucose, 1 hour: 243 mg/dL — ABNORMAL HIGH (ref 70–179)
Glucose, 2 hour: 210 mg/dL — ABNORMAL HIGH (ref 70–152)
Glucose, Fasting: 106 mg/dL — ABNORMAL HIGH (ref 70–91)

## 2022-12-01 ENCOUNTER — Telehealth: Payer: Self-pay

## 2022-12-01 ENCOUNTER — Encounter: Payer: Self-pay | Admitting: Family Medicine

## 2022-12-01 NOTE — Telephone Encounter (Addendum)
-----   Message from Venora Maples, MD sent at 12/01/2022  8:32 AM EDT ----- Abnormal GTT Clinical staff please notify patient, place referral to DM educator and send testing supplies  Called pt with Spanish Interpreter Claudia, and left message to return call to the office for results.  Leonette Nutting  12/01/22

## 2022-12-02 ENCOUNTER — Other Ambulatory Visit: Payer: Self-pay | Admitting: Family Medicine

## 2022-12-02 ENCOUNTER — Ambulatory Visit: Payer: Self-pay | Attending: Family Medicine

## 2022-12-02 ENCOUNTER — Other Ambulatory Visit: Payer: Self-pay | Admitting: *Deleted

## 2022-12-02 ENCOUNTER — Ambulatory Visit: Payer: Self-pay | Admitting: *Deleted

## 2022-12-02 VITALS — BP 134/81 | HR 85

## 2022-12-02 DIAGNOSIS — Z641 Problems related to multiparity: Secondary | ICD-10-CM

## 2022-12-02 DIAGNOSIS — O10919 Unspecified pre-existing hypertension complicating pregnancy, unspecified trimester: Secondary | ICD-10-CM | POA: Insufficient documentation

## 2022-12-02 DIAGNOSIS — Z8751 Personal history of pre-term labor: Secondary | ICD-10-CM

## 2022-12-02 DIAGNOSIS — O99212 Obesity complicating pregnancy, second trimester: Secondary | ICD-10-CM

## 2022-12-02 DIAGNOSIS — O0990 Supervision of high risk pregnancy, unspecified, unspecified trimester: Secondary | ICD-10-CM

## 2022-12-02 DIAGNOSIS — O24419 Gestational diabetes mellitus in pregnancy, unspecified control: Secondary | ICD-10-CM | POA: Insufficient documentation

## 2022-12-03 DIAGNOSIS — O0990 Supervision of high risk pregnancy, unspecified, unspecified trimester: Secondary | ICD-10-CM

## 2022-12-04 ENCOUNTER — Ambulatory Visit: Payer: Self-pay

## 2022-12-11 IMAGING — CR DG LUMBAR SPINE COMPLETE 4+V
5 series · 5 of 5 positions shown · non-contrast
Comparison: None.

CLINICAL DATA: Back pain.

EXAM:
LUMBAR SPINE - COMPLETE 4+ VIEW

[t lumbar spine ap]
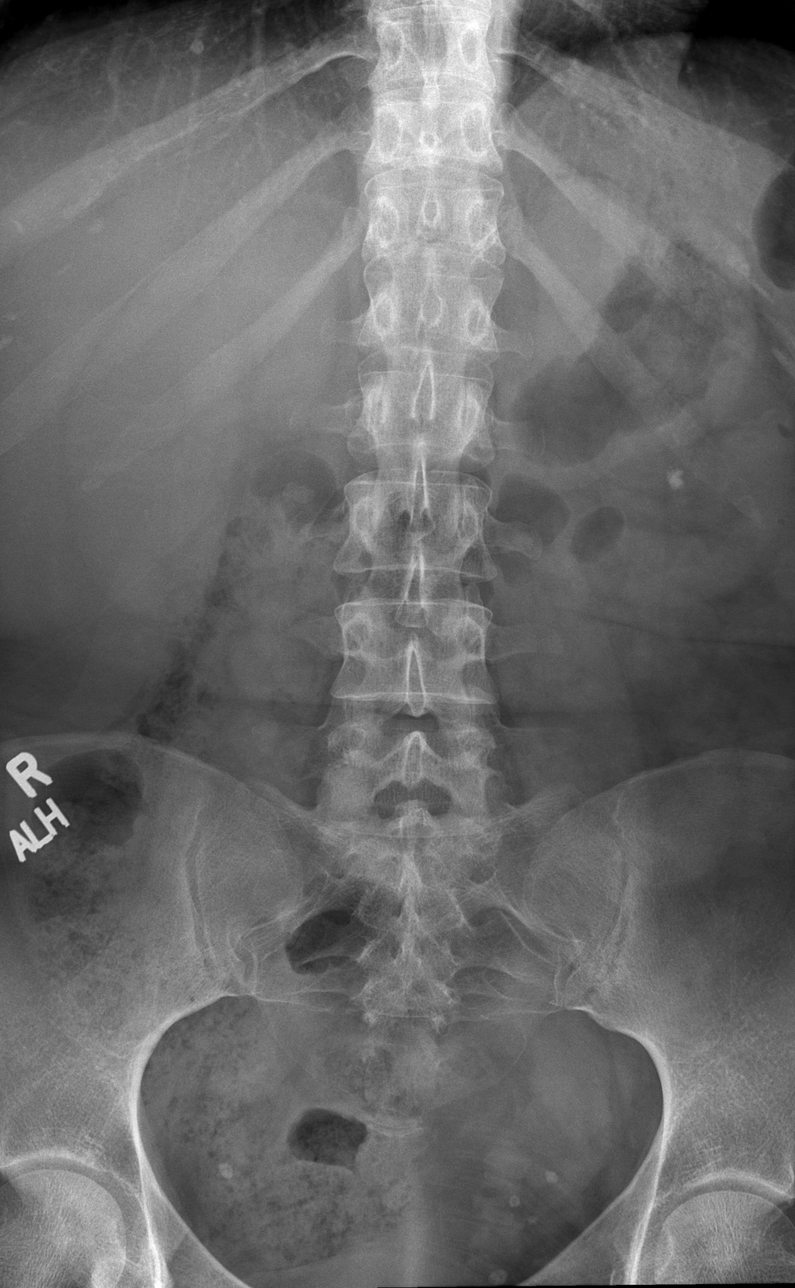

[t lumbar spine obl (1 of 2)]
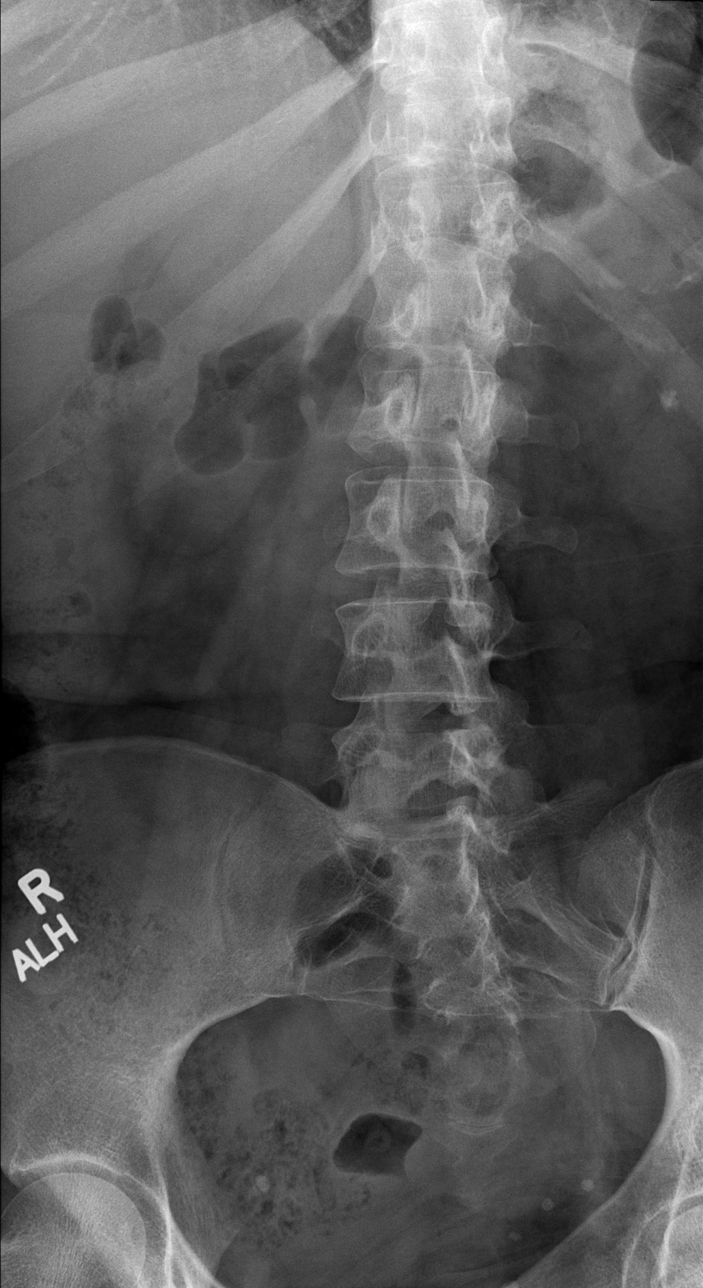

[t lumbar spine obl (2 of 2)]
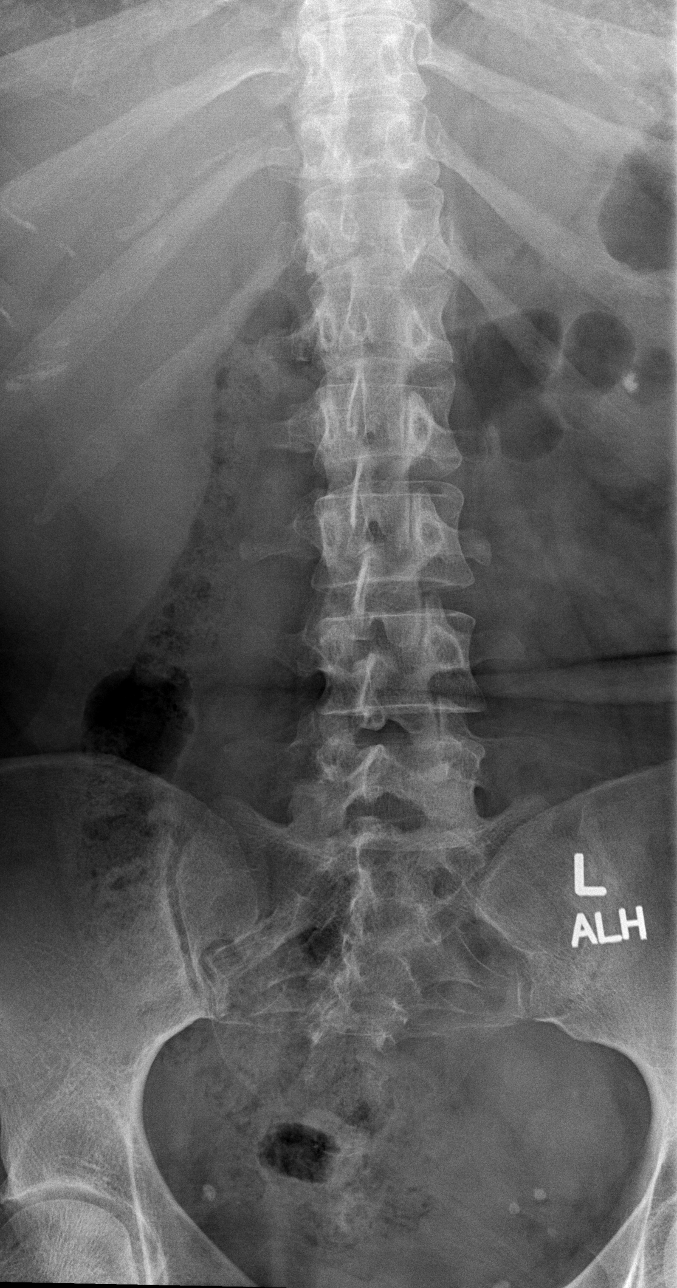

[t lumbar spine lat]
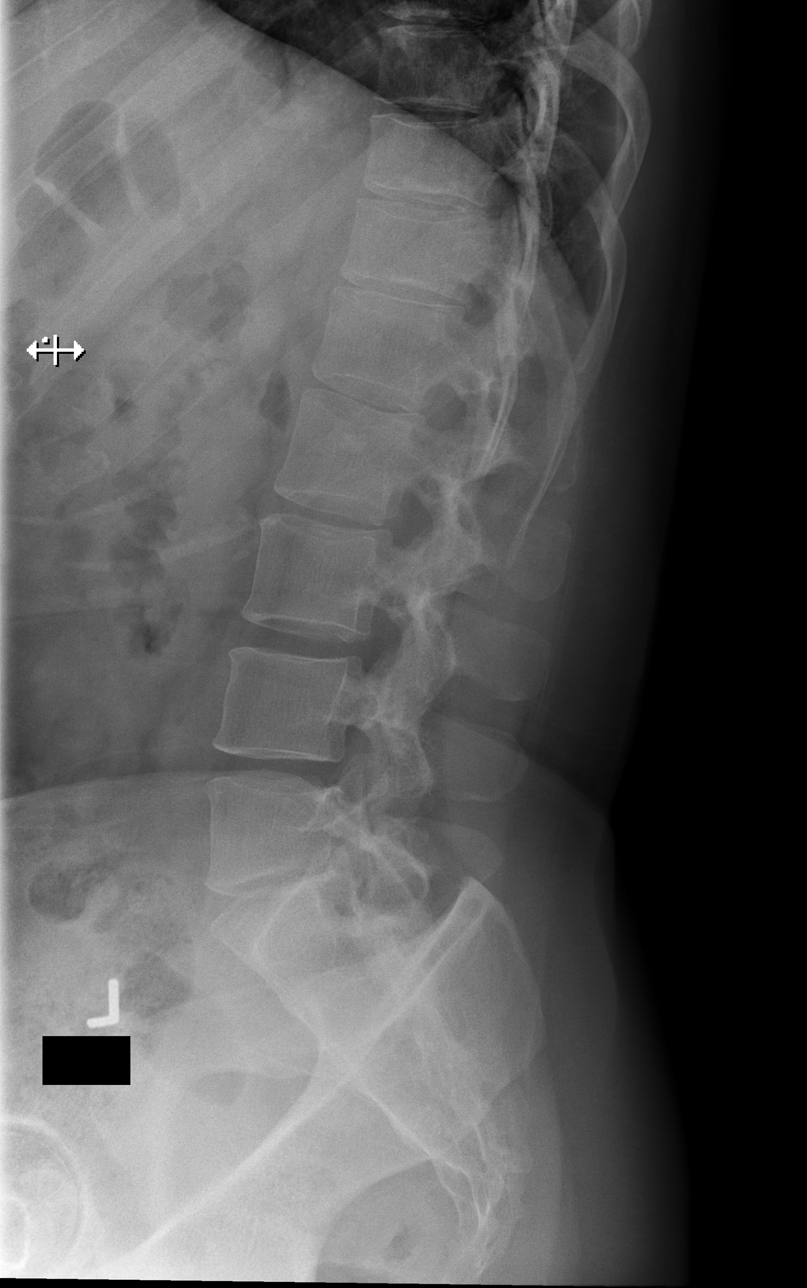

[t lumbar l-5 s-1 spot]
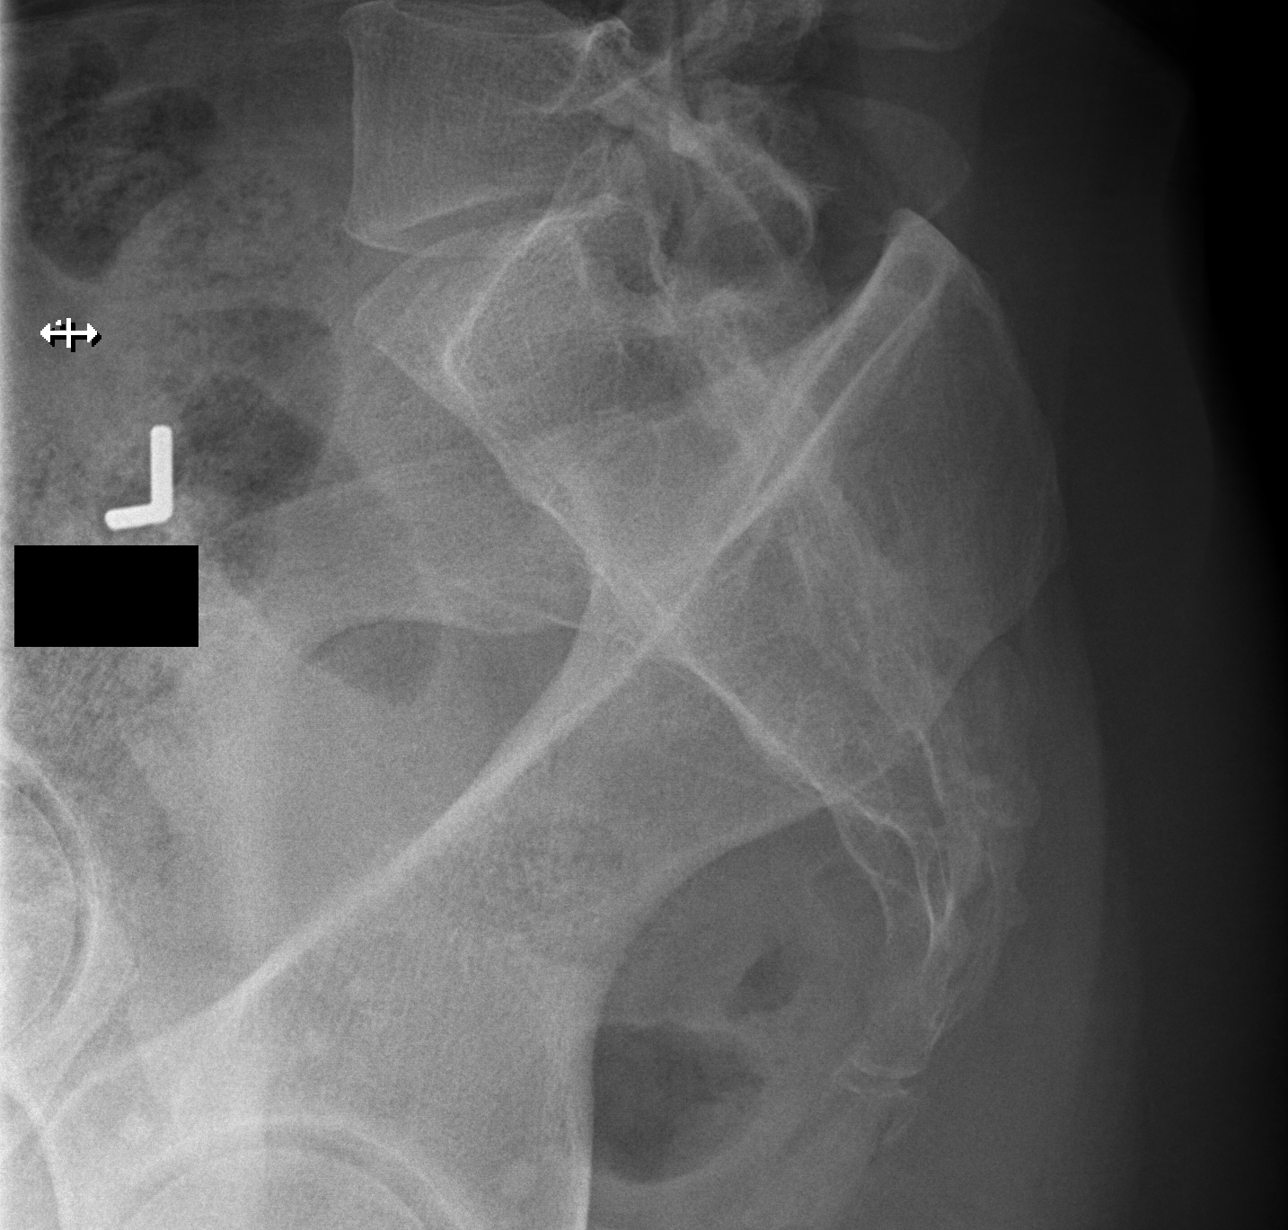

[5 of 5 positions shown; findings below may reference images not displayed]

FINDINGS: There is probable nephrolithiasis involving the lower pole the left
kidney. There are calcifications projecting over the patient's
pelvis that are favored to represent phleboliths. There is no acute
compression fracture. There is mild disc height loss throughout the
visualized thoracolumbar spine, greatest at the L5-S1 level. There
is no significant malalignment.
IMPRESSION: Mild degenerative disc disease throughout the visualized
thoracolumbar spine, greatest at the L5-S1 level. No acute
compression fracture.

## 2022-12-17 ENCOUNTER — Ambulatory Visit (HOSPITAL_BASED_OUTPATIENT_CLINIC_OR_DEPARTMENT_OTHER): Payer: Self-pay

## 2022-12-17 ENCOUNTER — Ambulatory Visit: Payer: Self-pay | Attending: Obstetrics and Gynecology | Admitting: *Deleted

## 2022-12-17 ENCOUNTER — Other Ambulatory Visit: Payer: Self-pay | Admitting: Obstetrics and Gynecology

## 2022-12-17 VITALS — BP 118/75 | HR 92

## 2022-12-17 DIAGNOSIS — O09212 Supervision of pregnancy with history of pre-term labor, second trimester: Secondary | ICD-10-CM | POA: Insufficient documentation

## 2022-12-17 DIAGNOSIS — O0942 Supervision of pregnancy with grand multiparity, second trimester: Secondary | ICD-10-CM

## 2022-12-17 DIAGNOSIS — O24410 Gestational diabetes mellitus in pregnancy, diet controlled: Secondary | ICD-10-CM | POA: Insufficient documentation

## 2022-12-17 DIAGNOSIS — Z3A21 21 weeks gestation of pregnancy: Secondary | ICD-10-CM | POA: Insufficient documentation

## 2022-12-17 DIAGNOSIS — O10012 Pre-existing essential hypertension complicating pregnancy, second trimester: Secondary | ICD-10-CM

## 2022-12-17 DIAGNOSIS — Z641 Problems related to multiparity: Secondary | ICD-10-CM

## 2022-12-17 DIAGNOSIS — O99212 Obesity complicating pregnancy, second trimester: Secondary | ICD-10-CM | POA: Insufficient documentation

## 2022-12-17 DIAGNOSIS — O0990 Supervision of high risk pregnancy, unspecified, unspecified trimester: Secondary | ICD-10-CM

## 2022-12-17 DIAGNOSIS — E669 Obesity, unspecified: Secondary | ICD-10-CM

## 2022-12-17 DIAGNOSIS — O09292 Supervision of pregnancy with other poor reproductive or obstetric history, second trimester: Secondary | ICD-10-CM | POA: Insufficient documentation

## 2022-12-17 DIAGNOSIS — O283 Abnormal ultrasonic finding on antenatal screening of mother: Secondary | ICD-10-CM

## 2022-12-17 DIAGNOSIS — O09522 Supervision of elderly multigravida, second trimester: Secondary | ICD-10-CM | POA: Insufficient documentation

## 2022-12-17 DIAGNOSIS — O24419 Gestational diabetes mellitus in pregnancy, unspecified control: Secondary | ICD-10-CM

## 2022-12-17 DIAGNOSIS — Z8751 Personal history of pre-term labor: Secondary | ICD-10-CM

## 2022-12-17 DIAGNOSIS — O10919 Unspecified pre-existing hypertension complicating pregnancy, unspecified trimester: Secondary | ICD-10-CM

## 2022-12-24 NOTE — Progress Notes (Signed)
   PRENATAL VISIT NOTE  Subjective:  Monica Wolfe is a 37 y.o. [phone] at [redacted]w[redacted]d being seen today for ongoing prenatal care.  She is currently monitored for the following issues for this high-risk pregnancy and has Language barrier; GDM (gestational diabetes mellitus); History of preterm delivery; Supervision of high risk pregnancy, antepartum; Chronic hypertension affecting pregnancy; Kidney stone; Recurrent UTI; Grand multipara; and Obesity affecting pregnancy on their problem list.  Patient reports no complaints.  Contractions: Not present. Vag. Bleeding: None.  Movement: Present. Denies leaking of fluid.   The following portions of the patient's history were reviewed and updated as appropriate: allergies, current medications, past family history, past medical history, past social history, past surgical history and problem list.   Objective:   Vitals:   12/25/22 0828  BP: 117/71  Pulse: 89  Weight: 223 lb (101.2 kg)    Fetal Status: Fetal Heart Rate (bpm): 141   Movement: Present     General:  Alert, oriented and cooperative. Patient is in no acute distress.  Skin: Skin is warm and dry. No rash noted.   Cardiovascular: Normal heart rate noted  Respiratory: Normal respiratory effort, no problems with respiration noted  Abdomen: Soft, gravid, appropriate for gestational age.  Pain/Pressure: Absent     Pelvic: Cervical exam deferred        Extremities: Normal range of motion.  Edema: None  Mental Status: Normal mood and affect. Normal behavior. Normal judgment and thought content.   Assessment and Plan:  Pregnancy: A5W0981 at 104w0d 1. Chronic hypertension affecting pregnancy Nifed 30 daily Baseline labs today.  BP today wnl.  2. Gestational diabetes mellitus (GDM) in second trimester, gestational diabetes method of control unspecified - Reviewed diagnosis of GDM - Discussed the risks associated in pregnancy especially with poor control including but not limited to  increased risk of preeclampsia, macrosomia, shoulder dystocia and neonatal hypoglycemia - Discussed diet and exercise modifications. Diabetes education referral recommended. Reviewed the importance of health weight gain and reviewed IOM recommendations.  - Growth [place]: 7/30 - Antenatal monitoring: Begin antenatal testing at 32 weeks, Check A1C today.  - Other needs:  Delivery plan will be around 37-38wks.  Has fetal echo scheduled for 7/22  3. Supervision of high risk pregnancy, antepartum 28w labs next visit along with tdap.  Discussed contraceptive plan - she is interested in tubal but no coverage. We spent a lot of time discussed LARC options as well as vasectomy. Partner with her today. Addressed some of his concerns but plan to have next appt with Dr. Crissie Reese.   4. Obesity affecting pregnancy in second trimester, unspecified obesity type  5. Language barrier Spanish interpreter used throughout  6. History of preterm delivery Normal serial CL thus far.   7. Grand multipara TXA at delivery  Preterm labor symptoms and general obstetric precautions including but not limited to vaginal bleeding, contractions, leaking of fluid and fetal movement were reviewed in detail with the patient. Please refer to After Visit Summary for other counseling recommendations.   Return in about 4 weeks (around 01/22/2023) for HROB VISIT, MD only.  Future Appointments  Date Time Provider Department Center  01/06/2023 10:15 AM [facility] NURSE [facility]  01/06/2023 10:30 AM [facility] US3 [facility]  01/20/2023 11:15 AM Hermina Staggers, MD [facility]    Milas Hock, MD

## 2022-12-25 ENCOUNTER — Other Ambulatory Visit: Payer: Self-pay

## 2022-12-25 ENCOUNTER — Ambulatory Visit (INDEPENDENT_AMBULATORY_CARE_PROVIDER_SITE_OTHER): Payer: Self-pay | Admitting: Obstetrics and Gynecology

## 2022-12-25 ENCOUNTER — Encounter: Payer: Self-pay | Admitting: Obstetrics and Gynecology

## 2022-12-25 VITALS — BP 117/71 | HR 89 | Wt 223.0 lb

## 2022-12-25 DIAGNOSIS — O10919 Unspecified pre-existing hypertension complicating pregnancy, unspecified trimester: Secondary | ICD-10-CM

## 2022-12-25 DIAGNOSIS — O24419 Gestational diabetes mellitus in pregnancy, unspecified control: Secondary | ICD-10-CM

## 2022-12-25 DIAGNOSIS — O0992 Supervision of high risk pregnancy, unspecified, second trimester: Secondary | ICD-10-CM

## 2022-12-25 DIAGNOSIS — O10912 Unspecified pre-existing hypertension complicating pregnancy, second trimester: Secondary | ICD-10-CM

## 2022-12-25 DIAGNOSIS — O0990 Supervision of high risk pregnancy, unspecified, unspecified trimester: Secondary | ICD-10-CM

## 2022-12-25 DIAGNOSIS — Z603 Acculturation difficulty: Secondary | ICD-10-CM

## 2022-12-25 DIAGNOSIS — O99212 Obesity complicating pregnancy, second trimester: Secondary | ICD-10-CM

## 2022-12-25 DIAGNOSIS — Z641 Problems related to multiparity: Secondary | ICD-10-CM

## 2022-12-25 DIAGNOSIS — Z3A25 25 weeks gestation of pregnancy: Secondary | ICD-10-CM

## 2022-12-25 DIAGNOSIS — Z8751 Personal history of pre-term labor: Secondary | ICD-10-CM

## 2022-12-25 DIAGNOSIS — Z758 Other problems related to medical facilities and other health care: Secondary | ICD-10-CM

## 2022-12-25 NOTE — Progress Notes (Signed)
Fetal echo scheduled for 7/22 by MFM

## 2022-12-25 NOTE — Patient Instructions (Signed)
Outpatient tubal ligation   Estimated out-of-pocket total for patient (does NOT include anesthesia)  Procedure fee + Facility fee  Salpingectomy: $855 + $5835.90 = $6690.90 [place]: $497.80 + $5835.90 = $6333.70 [place]: $497.80 + $5835.90 = $4270.62  Patient needs to inform staff of desire for tubal ligation Pre-payment of procedure fee is required for procedure to be scheduled  Payment plans are available upon request  Need to apply for Financial Aid to reduce your cost, ask [place] how!   ** Patient will need to call [facility] (Anesthesia) at [phone] for estimate   Ligadura de trompas para pacientes ambulatorios Costo total aproximado que le toca a la paciente (NO incluye la anestesia) Costo del procedimiento ms costo del centro Salpingectoma: $855 + $5835.90 = $6160.73 [place]: $497.80 + $5835.90 = $6333.70 [place] (clips) [place]: $497.80 + $5835.90 = $6333.70 La paciente debe informarle al personal de su deseo de hacerse la ligadura de trompas. Se requiere el pago por adelantado del costo del procedimiento para que este se Sports coach. Los planes de pago estn disponibles por solicitud. ** La paciente debe llamar a [facility] Janann August) al [phone] para pedir la cotizacin.    Tubal Ligation with C/S or Postpartum   With C/S: $1170  Postpartum: $1280 + anesthesia   Patient needs to inform staff of desire for tubal ligation as early as possible in the pregnancy  Pre-payment is required for procedure to be scheduled  Payment plans are available upon request   ** Patient will need to call [facility] (Anesthesia) at [phone] for estimate   Ligadura de trompas con cesrea o despus del parto Con cesrea: $1170 Despus del parto: $1280 ms la anestesia La paciente debe informarle al personal de su deseo de hacerse la ligadura de trompas tan pronto  como le sea posible durante el [place]. Se requiere el pago por adelantado para que el procedimiento pueda  programarse. Los planes de pago estn disponibles por solicitud. ** La paciente debe llamar a [facility] Janann August) al [phone] para pedir la cotizacin.

## 2022-12-26 LAB — COMPREHENSIVE METABOLIC PANEL
ALT: 10 IU/L (ref 0–32)
AST: 14 IU/L (ref 0–40)
Albumin: 3.7 g/dL — ABNORMAL LOW (ref 3.9–4.9)
Alkaline Phosphatase: 64 IU/L (ref 44–121)
BUN/Creatinine Ratio: 12 (ref 9–23)
BUN: 6 mg/dL (ref 6–20)
Bilirubin Total: 0.2 mg/dL (ref 0.0–1.2)
CO2: 19 mmol/L — ABNORMAL LOW (ref 20–29)
Calcium: 9.2 mg/dL (ref 8.7–10.2)
Chloride: 100 mmol/L (ref 96–106)
Creatinine, Ser: 0.52 mg/dL — ABNORMAL LOW (ref 0.57–1.00)
Globulin, Total: 3.1 g/dL (ref 1.5–4.5)
Glucose: 110 mg/dL — ABNORMAL HIGH (ref 70–99)
Potassium: 4.5 mmol/L (ref 3.5–5.2)
Sodium: 135 mmol/L (ref 134–144)
Total Protein: 6.8 g/dL (ref 6.0–8.5)
eGFR: 125 mL/min/{1.73_m2} (ref >59)

## 2022-12-26 LAB — TSH: TSH: 2.21 u[IU]/mL (ref 0.450–4.500)

## 2022-12-26 LAB — PROTEIN / CREATININE RATIO, URINE
Creatinine, Urine: 261.2 mg/dL
Protein, Ur: 135.6 mg/dL
Protein/Creat Ratio: 519 mg/g creat — ABNORMAL HIGH (ref 0–200)

## 2022-12-26 LAB — HEMOGLOBIN A1C
Est. average glucose Bld gHb Est-mCnc: 120 mg/dL
Hgb A1c MFr Bld: 5.8 % — ABNORMAL HIGH (ref 4.8–5.6)

## 2022-12-29 ENCOUNTER — Encounter: Payer: Self-pay | Admitting: Obstetrics and Gynecology

## 2022-12-29 ENCOUNTER — Telehealth: Payer: Self-pay

## 2022-12-29 DIAGNOSIS — R809 Proteinuria, unspecified: Secondary | ICD-10-CM | POA: Insufficient documentation

## 2022-12-29 NOTE — Telephone Encounter (Addendum)
-----   Message from Milas Hock sent at 12/29/2022  9:46 AM EDT ----- She has proteinuria but also history of UTI. Can you see if they can send this same urine for urine culture still? Thanks, pad   Reviewed with [facility] tech. Add on form submitted by Erie Noe ([facility] employee). Awaiting response from [facility].

## 2022-12-30 NOTE — Telephone Encounter (Signed)
[facility] confirmed they will be able to run urine culture.

## 2023-01-05 ENCOUNTER — Other Ambulatory Visit: Payer: Self-pay

## 2023-01-05 DIAGNOSIS — O24419 Gestational diabetes mellitus in pregnancy, unspecified control: Secondary | ICD-10-CM

## 2023-01-06 ENCOUNTER — Ambulatory Visit (INDEPENDENT_AMBULATORY_CARE_PROVIDER_SITE_OTHER): Payer: Self-pay | Admitting: Dietician

## 2023-01-06 ENCOUNTER — Ambulatory Visit: Payer: Self-pay | Admitting: *Deleted

## 2023-01-06 ENCOUNTER — Ambulatory Visit: Payer: Self-pay | Attending: Obstetrics and Gynecology

## 2023-01-06 ENCOUNTER — Other Ambulatory Visit: Payer: Self-pay

## 2023-01-06 ENCOUNTER — Encounter: Payer: Self-pay | Attending: Obstetrics and Gynecology | Admitting: Dietician

## 2023-01-06 ENCOUNTER — Other Ambulatory Visit: Payer: Self-pay | Admitting: *Deleted

## 2023-01-06 VITALS — BP 104/69 | HR 85

## 2023-01-06 DIAGNOSIS — O09292 Supervision of pregnancy with other poor reproductive or obstetric history, second trimester: Secondary | ICD-10-CM

## 2023-01-06 DIAGNOSIS — O10919 Unspecified pre-existing hypertension complicating pregnancy, unspecified trimester: Secondary | ICD-10-CM | POA: Insufficient documentation

## 2023-01-06 DIAGNOSIS — O24419 Gestational diabetes mellitus in pregnancy, unspecified control: Secondary | ICD-10-CM

## 2023-01-06 DIAGNOSIS — Z3A24 24 weeks gestation of pregnancy: Secondary | ICD-10-CM

## 2023-01-06 DIAGNOSIS — O10012 Pre-existing essential hypertension complicating pregnancy, second trimester: Secondary | ICD-10-CM

## 2023-01-06 DIAGNOSIS — Z713 Dietary counseling and surveillance: Secondary | ICD-10-CM | POA: Insufficient documentation

## 2023-01-06 DIAGNOSIS — O10912 Unspecified pre-existing hypertension complicating pregnancy, second trimester: Secondary | ICD-10-CM

## 2023-01-06 DIAGNOSIS — O99212 Obesity complicating pregnancy, second trimester: Secondary | ICD-10-CM | POA: Insufficient documentation

## 2023-01-06 DIAGNOSIS — Z8751 Personal history of pre-term labor: Secondary | ICD-10-CM | POA: Insufficient documentation

## 2023-01-06 DIAGNOSIS — O09522 Supervision of elderly multigravida, second trimester: Secondary | ICD-10-CM

## 2023-01-06 DIAGNOSIS — Z641 Problems related to multiparity: Secondary | ICD-10-CM

## 2023-01-06 DIAGNOSIS — O24410 Gestational diabetes mellitus in pregnancy, diet controlled: Secondary | ICD-10-CM

## 2023-01-06 DIAGNOSIS — O0942 Supervision of pregnancy with grand multiparity, second trimester: Secondary | ICD-10-CM

## 2023-01-06 DIAGNOSIS — O0990 Supervision of high risk pregnancy, unspecified, unspecified trimester: Secondary | ICD-10-CM | POA: Insufficient documentation

## 2023-01-06 DIAGNOSIS — Z3A26 26 weeks gestation of pregnancy: Secondary | ICD-10-CM | POA: Insufficient documentation

## 2023-01-06 DIAGNOSIS — E669 Obesity, unspecified: Secondary | ICD-10-CM

## 2023-01-06 DIAGNOSIS — O283 Abnormal ultrasonic finding on antenatal screening of mother: Secondary | ICD-10-CM

## 2023-01-06 NOTE — Progress Notes (Signed)
Patient was seen for Gestational Diabetes self-management on 01/06/2023  Start time 0920 and End time 1005   Estimated due date: 04/23/2023; [redacted]w[redacted]d   Clinical: Medications: prenatal vitamin Medical History: GDM history and current Labs:   A1c 5  .8% 12/25/2022  Dietary and Lifestyle History: Patient is pregnant with her 6th child.  Patient lives with her husband and 18-35 yo children.  She works in [employer] but is off during pregnancy.  Physical Activity: walks 5 days per week for 30 minutes Stress: good Sleep: good  24 hr Recall:  First Meal: skips Snack:  none Second meal 2 pm:  egg, ham Snack:  none Third meal 6pm:  potatoes, sausage Snack:  none Beverages:  water, sugar free flavor packets, coffee with half and half and 1 T sugar once per week, occasional regular soda, no juice   NUTRITION INTERVENTION  Nutrition education (E-1) on the following topics:   Initial Follow-up  [x]  []  Definition of Gestational Diabetes [x]  []  Why dietary management is important in controlling blood glucose [x]  []  Effects each nutrient has on blood glucose levels [x]  []  Simple carbohydrates vs complex carbohydrates [x]  []  Fluid intake [x]  []  Creating a balanced meal plan [x]  []  Carbohydrate counting  [x]  []  When to check blood glucose levels [x]  []  Proper blood glucose monitoring techniques [x]  []  Effect of stress and stress reduction techniques  [x]  []  Exercise effect on blood glucose levels, appropriate exercise during pregnancy [x]  []  Importance of limiting caffeine and abstaining from alcohol and smoking []  []  Medications used for blood sugar control during pregnancy [x]  []  Hypoglycemia and rule of 15 [x]  []  Postpartum self care  Blood glucose monitor given: [employer] Meter Lot # [id number] Strip Lot:  [id number] Strip Exp: 11/28/2023 CBG: 119 mg/dL  Patient instructed to monitor glucose levels: FBS: 60 - ? 95 mg/dL; 2 hour: ? 696 mg/dL  Patient received  handouts: Nutrition Diabetes and Pregnancy Carbohydrate Counting List  Patient will be seen for follow-up as needed.

## 2023-01-09 ENCOUNTER — Other Ambulatory Visit (INDEPENDENT_AMBULATORY_CARE_PROVIDER_SITE_OTHER): Payer: Self-pay | Admitting: Obstetrics and Gynecology

## 2023-01-09 DIAGNOSIS — I10 Essential (primary) hypertension: Secondary | ICD-10-CM

## 2023-01-12 ENCOUNTER — Telehealth: Payer: Self-pay | Admitting: Family Medicine

## 2023-01-12 MED ORDER — NIFEDIPINE ER OSMOTIC RELEASE 30 MG PO TB24
30.0000 mg | ORAL_TABLET | Freq: Every day | ORAL | 6 refills | Status: DC
Start: 2023-01-12 — End: 2023-01-12

## 2023-01-12 MED ORDER — NIFEDIPINE ER OSMOTIC RELEASE 30 MG PO TB24
30.0000 mg | ORAL_TABLET | Freq: Every day | ORAL | 6 refills | Status: DC
Start: 2023-01-12 — End: 2023-03-31

## 2023-01-12 NOTE — Telephone Encounter (Signed)
Patient called into office requesting refill on her nifedipine. Patient informed refills would be sent to pharmacy.

## 2023-01-12 NOTE — Telephone Encounter (Signed)
Rx phoned in to [facility]

## 2023-01-12 NOTE — Addendum Note (Signed)
Addended by: Kathee Delton on: 01/12/2023 01:23 PM   Modules accepted: Orders

## 2023-01-20 ENCOUNTER — Encounter: Payer: Self-pay | Admitting: Obstetrics and Gynecology

## 2023-01-28 ENCOUNTER — Encounter: Payer: Self-pay | Admitting: *Deleted

## 2023-02-03 ENCOUNTER — Other Ambulatory Visit: Payer: Self-pay

## 2023-02-03 ENCOUNTER — Encounter: Payer: Self-pay | Admitting: Family Medicine

## 2023-02-04 ENCOUNTER — Ambulatory Visit: Payer: Self-pay | Attending: Obstetrics

## 2023-02-04 ENCOUNTER — Ambulatory Visit: Payer: Self-pay | Admitting: *Deleted

## 2023-02-04 ENCOUNTER — Other Ambulatory Visit: Payer: Self-pay | Admitting: *Deleted

## 2023-02-04 VITALS — BP 117/72 | HR 84

## 2023-02-04 DIAGNOSIS — O09523 Supervision of elderly multigravida, third trimester: Secondary | ICD-10-CM

## 2023-02-04 DIAGNOSIS — Z3A28 28 weeks gestation of pregnancy: Secondary | ICD-10-CM

## 2023-02-04 DIAGNOSIS — O10919 Unspecified pre-existing hypertension complicating pregnancy, unspecified trimester: Secondary | ICD-10-CM

## 2023-02-04 DIAGNOSIS — O09522 Supervision of elderly multigravida, second trimester: Secondary | ICD-10-CM | POA: Insufficient documentation

## 2023-02-04 DIAGNOSIS — O24410 Gestational diabetes mellitus in pregnancy, diet controlled: Secondary | ICD-10-CM

## 2023-02-04 DIAGNOSIS — E669 Obesity, unspecified: Secondary | ICD-10-CM

## 2023-02-04 DIAGNOSIS — O24419 Gestational diabetes mellitus in pregnancy, unspecified control: Secondary | ICD-10-CM

## 2023-02-04 DIAGNOSIS — O10013 Pre-existing essential hypertension complicating pregnancy, third trimester: Secondary | ICD-10-CM

## 2023-02-04 DIAGNOSIS — O0943 Supervision of pregnancy with grand multiparity, third trimester: Secondary | ICD-10-CM

## 2023-02-04 DIAGNOSIS — O10913 Unspecified pre-existing hypertension complicating pregnancy, third trimester: Secondary | ICD-10-CM

## 2023-02-04 DIAGNOSIS — O09293 Supervision of pregnancy with other poor reproductive or obstetric history, third trimester: Secondary | ICD-10-CM

## 2023-02-04 DIAGNOSIS — Z8751 Personal history of pre-term labor: Secondary | ICD-10-CM

## 2023-02-04 DIAGNOSIS — Z641 Problems related to multiparity: Secondary | ICD-10-CM | POA: Insufficient documentation

## 2023-02-04 DIAGNOSIS — O283 Abnormal ultrasonic finding on antenatal screening of mother: Secondary | ICD-10-CM

## 2023-02-04 DIAGNOSIS — O99213 Obesity complicating pregnancy, third trimester: Secondary | ICD-10-CM

## 2023-02-04 DIAGNOSIS — O10912 Unspecified pre-existing hypertension complicating pregnancy, second trimester: Secondary | ICD-10-CM | POA: Insufficient documentation

## 2023-02-04 DIAGNOSIS — O0990 Supervision of high risk pregnancy, unspecified, unspecified trimester: Secondary | ICD-10-CM

## 2023-02-04 DIAGNOSIS — O09213 Supervision of pregnancy with history of pre-term labor, third trimester: Secondary | ICD-10-CM

## 2023-02-05 ENCOUNTER — Encounter: Payer: Self-pay | Admitting: Family Medicine

## 2023-02-05 ENCOUNTER — Ambulatory Visit (INDEPENDENT_AMBULATORY_CARE_PROVIDER_SITE_OTHER): Payer: Self-pay | Admitting: Family Medicine

## 2023-02-05 ENCOUNTER — Other Ambulatory Visit: Payer: Self-pay

## 2023-02-05 VITALS — BP 134/84 | HR 101 | Wt 230.2 lb

## 2023-02-05 DIAGNOSIS — O99212 Obesity complicating pregnancy, second trimester: Secondary | ICD-10-CM

## 2023-02-05 DIAGNOSIS — O24419 Gestational diabetes mellitus in pregnancy, unspecified control: Secondary | ICD-10-CM

## 2023-02-05 DIAGNOSIS — O0990 Supervision of high risk pregnancy, unspecified, unspecified trimester: Secondary | ICD-10-CM

## 2023-02-05 DIAGNOSIS — Z641 Problems related to multiparity: Secondary | ICD-10-CM

## 2023-02-05 DIAGNOSIS — O10913 Unspecified pre-existing hypertension complicating pregnancy, third trimester: Secondary | ICD-10-CM

## 2023-02-05 DIAGNOSIS — Z8751 Personal history of pre-term labor: Secondary | ICD-10-CM

## 2023-02-05 DIAGNOSIS — O99213 Obesity complicating pregnancy, third trimester: Secondary | ICD-10-CM

## 2023-02-05 DIAGNOSIS — O0993 Supervision of high risk pregnancy, unspecified, third trimester: Secondary | ICD-10-CM

## 2023-02-05 DIAGNOSIS — O10919 Unspecified pre-existing hypertension complicating pregnancy, unspecified trimester: Secondary | ICD-10-CM

## 2023-02-05 DIAGNOSIS — R809 Proteinuria, unspecified: Secondary | ICD-10-CM

## 2023-02-05 DIAGNOSIS — Z3A29 29 weeks gestation of pregnancy: Secondary | ICD-10-CM

## 2023-02-05 MED ORDER — METFORMIN HCL 500 MG PO TABS
1000.0000 mg | ORAL_TABLET | Freq: Two times a day (BID) | ORAL | 5 refills | Status: DC
Start: 2023-02-05 — End: 2023-04-04

## 2023-02-05 NOTE — Patient Instructions (Signed)

## 2023-02-05 NOTE — Progress Notes (Signed)
   Subjective:  Monica Wolfe is a 37 y.o. [phone] at [redacted]w[redacted]d being seen today for ongoing prenatal care.  She is currently monitored for the following issues for this high-risk pregnancy and has GDM (gestational diabetes mellitus); History of preterm delivery; Supervision of high risk pregnancy, antepartum; Chronic hypertension affecting pregnancy; Grand multipara; Obesity affecting pregnancy; and Proteinuria on their problem list.  Patient reports no complaints.  Contractions: Not present. Vag. Bleeding: None.  Movement: Present. Denies leaking of fluid.   The following portions of the patient's history were reviewed and updated as appropriate: allergies, current medications, past family history, past medical history, past social history, past surgical history and problem list. Problem list updated.  Objective:   Vitals:   02/05/23 1102 02/05/23 1108  BP: (!) 140/83 134/84  Pulse: (!) 103 (!) 101  Weight: 230 lb 3.2 oz (104.4 kg)     Fetal Status: Fetal Heart Rate (bpm): 152   Movement: Present     General:  Alert, oriented and cooperative. Patient is in no acute distress.  Skin: Skin is warm and dry. No rash noted.   Cardiovascular: Normal heart rate noted  Respiratory: Normal respiratory effort, no problems with respiration noted  Abdomen: Soft, gravid, appropriate for gestational age. Pain/Pressure: Absent     Pelvic: Vag. Bleeding: None     Cervical exam deferred        Extremities: Normal range of motion.     Mental Status: Normal mood and affect. Normal behavior. Normal judgment and thought content.   Urinalysis:         Assessment and Plan:  Pregnancy: G4W1027 at [redacted]w[redacted]d  1. Supervision of high risk pregnancy, antepartum BP normal on recheck, FHR normal  2. Gestational diabetes mellitus (GDM) in third trimester, gestational diabetes method of control unspecified Log reviewed, poor control Last growth [place] 02/04/23, EFW 72%, AC 93%, AFI 14 Start metformin 500 mg  BID  3. Chronic hypertension affecting pregnancy Controlled on nifed 30 XL Taking ASA Start antenatal testing at 32 weeks  4. Proteinuria, unspecified type ctm  5. Obesity affecting pregnancy in second trimester, unspecified obesity type   6. History of preterm delivery PPROM at 28 wks last pregnancy, I attended that delivery at 30 weeks Remainder have been term  7. Grand multipara TXA at delivery  Preterm labor symptoms and general obstetric precautions including but not limited to vaginal bleeding, contractions, leaking of fluid and fetal movement were reviewed in detail with the patient. Please refer to After Visit Summary for other counseling recommendations.  Return in 2 weeks (on 02/19/2023) for [facility], ob visit.   Venora Maples, MD

## 2023-02-19 ENCOUNTER — Encounter: Payer: Self-pay | Admitting: Family Medicine

## 2023-02-24 ENCOUNTER — Other Ambulatory Visit: Payer: Self-pay

## 2023-03-04 ENCOUNTER — Ambulatory Visit (HOSPITAL_BASED_OUTPATIENT_CLINIC_OR_DEPARTMENT_OTHER): Payer: Self-pay

## 2023-03-04 ENCOUNTER — Ambulatory Visit: Payer: Self-pay | Attending: Obstetrics and Gynecology | Admitting: *Deleted

## 2023-03-04 VITALS — BP 121/69 | HR 98

## 2023-03-04 DIAGNOSIS — O24419 Gestational diabetes mellitus in pregnancy, unspecified control: Secondary | ICD-10-CM

## 2023-03-04 DIAGNOSIS — O10013 Pre-existing essential hypertension complicating pregnancy, third trimester: Secondary | ICD-10-CM

## 2023-03-04 DIAGNOSIS — O09523 Supervision of elderly multigravida, third trimester: Secondary | ICD-10-CM

## 2023-03-04 DIAGNOSIS — O09293 Supervision of pregnancy with other poor reproductive or obstetric history, third trimester: Secondary | ICD-10-CM

## 2023-03-04 DIAGNOSIS — O09213 Supervision of pregnancy with history of pre-term labor, third trimester: Secondary | ICD-10-CM

## 2023-03-04 DIAGNOSIS — Z3A32 32 weeks gestation of pregnancy: Secondary | ICD-10-CM

## 2023-03-04 DIAGNOSIS — O283 Abnormal ultrasonic finding on antenatal screening of mother: Secondary | ICD-10-CM

## 2023-03-04 DIAGNOSIS — O99213 Obesity complicating pregnancy, third trimester: Secondary | ICD-10-CM | POA: Insufficient documentation

## 2023-03-04 DIAGNOSIS — Z8751 Personal history of pre-term labor: Secondary | ICD-10-CM

## 2023-03-04 DIAGNOSIS — O24410 Gestational diabetes mellitus in pregnancy, diet controlled: Secondary | ICD-10-CM | POA: Insufficient documentation

## 2023-03-04 DIAGNOSIS — O289 Unspecified abnormal findings on antenatal screening of mother: Secondary | ICD-10-CM | POA: Insufficient documentation

## 2023-03-04 DIAGNOSIS — E669 Obesity, unspecified: Secondary | ICD-10-CM

## 2023-03-04 DIAGNOSIS — O0990 Supervision of high risk pregnancy, unspecified, unspecified trimester: Secondary | ICD-10-CM

## 2023-03-04 DIAGNOSIS — Z641 Problems related to multiparity: Secondary | ICD-10-CM

## 2023-03-04 DIAGNOSIS — O10913 Unspecified pre-existing hypertension complicating pregnancy, third trimester: Secondary | ICD-10-CM

## 2023-03-04 DIAGNOSIS — O10919 Unspecified pre-existing hypertension complicating pregnancy, unspecified trimester: Secondary | ICD-10-CM

## 2023-03-04 DIAGNOSIS — O0943 Supervision of pregnancy with grand multiparity, third trimester: Secondary | ICD-10-CM

## 2023-03-06 ENCOUNTER — Ambulatory Visit (INDEPENDENT_AMBULATORY_CARE_PROVIDER_SITE_OTHER): Payer: Self-pay | Admitting: Obstetrics & Gynecology

## 2023-03-06 ENCOUNTER — Other Ambulatory Visit: Payer: Self-pay

## 2023-03-06 VITALS — BP 119/78 | HR 106 | Wt 235.5 lb

## 2023-03-06 DIAGNOSIS — O0993 Supervision of high risk pregnancy, unspecified, third trimester: Secondary | ICD-10-CM

## 2023-03-06 DIAGNOSIS — O24419 Gestational diabetes mellitus in pregnancy, unspecified control: Secondary | ICD-10-CM

## 2023-03-06 DIAGNOSIS — O0990 Supervision of high risk pregnancy, unspecified, unspecified trimester: Secondary | ICD-10-CM

## 2023-03-06 DIAGNOSIS — O10919 Unspecified pre-existing hypertension complicating pregnancy, unspecified trimester: Secondary | ICD-10-CM

## 2023-03-06 DIAGNOSIS — Z641 Problems related to multiparity: Secondary | ICD-10-CM

## 2023-03-06 DIAGNOSIS — O10913 Unspecified pre-existing hypertension complicating pregnancy, third trimester: Secondary | ICD-10-CM

## 2023-03-06 MED ORDER — INSULIN NPH (HUMAN) (ISOPHANE) 100 UNIT/ML ~~LOC~~ SUSP
10.0000 [IU] | Freq: Every day | SUBCUTANEOUS | 2 refills | Status: DC
Start: 2023-03-06 — End: 2023-04-04

## 2023-03-06 NOTE — Progress Notes (Signed)
   PRENATAL VISIT NOTE  Subjective:  Monica Wolfe is a 37 y.o. [phone] at [redacted]w[redacted]d being seen today for ongoing prenatal care.  She is currently monitored for the following issues for this high-risk pregnancy and has GDM (gestational diabetes mellitus); History of preterm delivery; Supervision of high risk pregnancy, antepartum; Chronic hypertension affecting pregnancy; Grand multipara; Obesity affecting pregnancy; and Proteinuria on their problem list.  Patient reports no complaints.  Contractions: Irritability. Vag. Bleeding: None.  Movement: Present. Denies leaking of fluid.   The following portions of the patient's history were reviewed and updated as appropriate: allergies, current medications, past family history, past medical history, past social history, past surgical history and problem list.   Objective:   Vitals:   03/06/23 1105  BP: 119/78  Pulse: (!) 106  Weight: 235 lb 8 oz (106.8 kg)    Fetal Status: Fetal Heart Rate (bpm): 150   Movement: Present     General:  Alert, oriented and cooperative. Patient is in no acute distress.  Skin: Skin is warm and dry. No rash noted.   Cardiovascular: Normal heart rate noted  Respiratory: Normal respiratory effort, no problems with respiration noted  Abdomen: Soft, gravid, appropriate for gestational age.  Pain/Pressure: Present     Pelvic: Cervical exam deferred        Extremities: Normal range of motion.  Edema: Trace  Mental Status: Normal mood and affect. Normal behavior. Normal judgment and thought content.   Assessment and Plan:  Pregnancy: A5W0981 at [redacted]w[redacted]d 1. Gestational diabetes mellitus (GDM) in third trimester, gestational diabetes method of control unspecified She reports her BG worsened since her last visit when metformn was started, with elevated FBS and PP up to 175 but she did not bring her log. Will start insulin  - insulin NPH Human (NOVOLIN N) 100 UNIT/ML injection; Inject 0.1 mLs (10 Units total) into the  skin daily before breakfast.  Dispense: 10 mL; Refill: 2 - Ambulatory referral to Nutrition and Diabetic Education  2. Chronic hypertension affecting pregnancy Well controlled  3. Supervision of high risk pregnancy, antepartum F/u US scheduled  4. Grand multipara   Preterm labor symptoms and general obstetric precautions including but not limited to vaginal bleeding, contractions, leaking of fluid and fetal movement were reviewed in detail with the patient. Please refer to After Visit Summary for other counseling recommendations.   Return in about 1 week (around 03/13/2023).  Future Appointments  Date Time Provider Department Center  03/09/2023  8:00 AM Bonnita Levan, RD [facility]  03/11/2023  9:15 AM [facility] NURSE [facility]  03/11/2023  9:30 AM [facility] US5 [facility]  03/18/2023  9:15 AM [facility] NURSE [facility]  03/18/2023  9:30 AM [facility] US5 [facility]  03/25/2023  9:15 AM [facility] NURSE [facility]  03/25/2023  9:30 AM [facility] US2 [facility]  04/01/2023  9:15 AM [facility] NURSE [facility]  04/01/2023  9:30 AM [facility] US2 [facility]    Scheryl Darter, MD

## 2023-03-06 NOTE — Progress Notes (Signed)
Informed needs to start insulin by Dr. Debroah Loop and will need diabetes education to teach how to administer insulin asap.I called and scheduled appointment for Monday 03/09/23 at 8am at [facility] . I informed patient with Interpreter Alvera Singh of appointment and address. She voices understanding and that she can attend the appointment. Nancy Fetter

## 2023-03-09 ENCOUNTER — Encounter: Payer: Self-pay | Attending: Family Medicine | Admitting: Dietician

## 2023-03-09 VITALS — Ht 66.0 in | Wt 236.0 lb

## 2023-03-09 DIAGNOSIS — Z713 Dietary counseling and surveillance: Secondary | ICD-10-CM | POA: Insufficient documentation

## 2023-03-09 DIAGNOSIS — O24419 Gestational diabetes mellitus in pregnancy, unspecified control: Secondary | ICD-10-CM | POA: Insufficient documentation

## 2023-03-09 DIAGNOSIS — Z3A33 33 weeks gestation of pregnancy: Secondary | ICD-10-CM | POA: Insufficient documentation

## 2023-03-09 NOTE — Progress Notes (Signed)
Patient was seen for Gestational Diabetes self-management on 01/06/2023  Start time 0810 and End time 0840 Patient is here today for insulin instruction.  Estimated due date: 04/23/2023; [redacted]w[redacted]d   Clinical: Medications: prenatal vitamin, metformin, NPH (Novolin N) - 10 units q am (not yet started) Medical History: GDM history and current Labs:   A1c 5  .8% 12/25/2022 Fasting blood glucose 106 and post meal high of 134  She did not bring her blood glucose log today. She skips meals frequently.    Dietary and Lifestyle History: Patient is pregnant with her 6th child.  Patient lives with her husband and 53-21 yo children.  She works in [employer] but is off during pregnancy.  Physical Activity: walks 5 days per week for 30 minutes Stress: good Sleep: good  24 hr Recall:  Appetite is low. First Meal: skips frequently  Snack:  none Second meal 2 pm:  chicken tacos Snack:  none Third meal 6pm:  none Snack:  none Beverages:  water, sugar free flavor packets, coffee with half and half and 1 T sugar once per week, occasional regular soda, no juice   NUTRITION INTERVENTION  Nutrition education (E-1) on the following topics:   Initial Follow-up  [x]  []  Definition of Gestational Diabetes [x]  []  Why dietary management is important in controlling blood glucose [x]  []  Effects each nutrient has on blood glucose levels [x]  []  Simple carbohydrates vs complex carbohydrates [x]  []  Fluid intake [x]  [x]  Creating a balanced meal plan [x]  []  Carbohydrate counting  [x]  []  When to check blood glucose levels [x]  []  Proper blood glucose monitoring techniques [x]  []  Effect of stress and stress reduction techniques  [x]  []  Exercise effect on blood glucose levels, appropriate exercise during pregnancy [x]  []  Importance of limiting caffeine and abstaining from alcohol and smoking []  [x]  Medications used for blood sugar control during pregnancy [x]  [x]  Hypoglycemia and rule of 15 [x]  []  Postpartum self  care Discussed the type of insulin, injection technique, site rotation, time of injection and dose, and disposal of needles. Encouraged small meals and avoid skipping.  Patient instructed to monitor glucose levels: FBS: 60 - <= 95 mg/dL; 2 hour: <= 161 mg/dL  Patient received handouts: initial visit Nutrition Diabetes and Pregnancy Carbohydrate Counting List  Patient will be seen for follow-up as needed.

## 2023-03-11 ENCOUNTER — Ambulatory Visit (HOSPITAL_BASED_OUTPATIENT_CLINIC_OR_DEPARTMENT_OTHER): Payer: Self-pay

## 2023-03-11 ENCOUNTER — Ambulatory Visit: Payer: Self-pay | Attending: Obstetrics and Gynecology | Admitting: *Deleted

## 2023-03-11 VITALS — BP 128/83 | HR 90

## 2023-03-11 DIAGNOSIS — O24419 Gestational diabetes mellitus in pregnancy, unspecified control: Secondary | ICD-10-CM

## 2023-03-11 DIAGNOSIS — O09213 Supervision of pregnancy with history of pre-term labor, third trimester: Secondary | ICD-10-CM

## 2023-03-11 DIAGNOSIS — O24414 Gestational diabetes mellitus in pregnancy, insulin controlled: Secondary | ICD-10-CM

## 2023-03-11 DIAGNOSIS — E669 Obesity, unspecified: Secondary | ICD-10-CM

## 2023-03-11 DIAGNOSIS — O09523 Supervision of elderly multigravida, third trimester: Secondary | ICD-10-CM

## 2023-03-11 DIAGNOSIS — O09293 Supervision of pregnancy with other poor reproductive or obstetric history, third trimester: Secondary | ICD-10-CM | POA: Insufficient documentation

## 2023-03-11 DIAGNOSIS — O99213 Obesity complicating pregnancy, third trimester: Secondary | ICD-10-CM

## 2023-03-11 DIAGNOSIS — O10913 Unspecified pre-existing hypertension complicating pregnancy, third trimester: Secondary | ICD-10-CM | POA: Insufficient documentation

## 2023-03-11 DIAGNOSIS — Z3A33 33 weeks gestation of pregnancy: Secondary | ICD-10-CM

## 2023-03-11 DIAGNOSIS — O0943 Supervision of pregnancy with grand multiparity, third trimester: Secondary | ICD-10-CM | POA: Insufficient documentation

## 2023-03-11 DIAGNOSIS — O283 Abnormal ultrasonic finding on antenatal screening of mother: Secondary | ICD-10-CM

## 2023-03-11 DIAGNOSIS — O10013 Pre-existing essential hypertension complicating pregnancy, third trimester: Secondary | ICD-10-CM

## 2023-03-16 ENCOUNTER — Other Ambulatory Visit: Payer: Self-pay

## 2023-03-16 ENCOUNTER — Ambulatory Visit (INDEPENDENT_AMBULATORY_CARE_PROVIDER_SITE_OTHER): Payer: Self-pay | Admitting: Family Medicine

## 2023-03-16 VITALS — BP 121/84 | HR 97 | Wt 238.7 lb

## 2023-03-16 DIAGNOSIS — O0993 Supervision of high risk pregnancy, unspecified, third trimester: Secondary | ICD-10-CM

## 2023-03-16 DIAGNOSIS — O10913 Unspecified pre-existing hypertension complicating pregnancy, third trimester: Secondary | ICD-10-CM

## 2023-03-16 DIAGNOSIS — Z8751 Personal history of pre-term labor: Secondary | ICD-10-CM

## 2023-03-16 DIAGNOSIS — O10919 Unspecified pre-existing hypertension complicating pregnancy, unspecified trimester: Secondary | ICD-10-CM

## 2023-03-16 DIAGNOSIS — Z641 Problems related to multiparity: Secondary | ICD-10-CM

## 2023-03-16 DIAGNOSIS — O99213 Obesity complicating pregnancy, third trimester: Secondary | ICD-10-CM

## 2023-03-16 DIAGNOSIS — O24414 Gestational diabetes mellitus in pregnancy, insulin controlled: Secondary | ICD-10-CM

## 2023-03-16 DIAGNOSIS — Z3A34 34 weeks gestation of pregnancy: Secondary | ICD-10-CM

## 2023-03-16 DIAGNOSIS — O0990 Supervision of high risk pregnancy, unspecified, unspecified trimester: Secondary | ICD-10-CM

## 2023-03-16 NOTE — Progress Notes (Signed)
   PRENATAL VISIT NOTE  Subjective:  Monica Wolfe is a 37 y.o. [phone] at [redacted]w[redacted]d being seen today for ongoing prenatal care.  She is currently monitored for the following issues for this high-risk pregnancy and has GDM (gestational diabetes mellitus); History of preterm delivery; Supervision of high risk pregnancy, antepartum; Chronic hypertension affecting pregnancy; Grand multipara; Obesity affecting pregnancy; and Proteinuria on their problem list.  Patient reports no complaints.  Contractions: Irritability. Vag. Bleeding: None.  Movement: Present. Denies leaking of fluid.   The following portions of the patient's history were reviewed and updated as appropriate: allergies, current medications, past family history, past medical history, past social history, past surgical history and problem list.   Objective:   Vitals:   03/16/23 1322  BP: 121/84  Pulse: 97  Weight: 238 lb 11.2 oz (108.3 kg)    Fetal Status: Fetal Heart Rate (bpm): 152   Movement: Present     General:  Alert, oriented and cooperative. Patient is in no acute distress.  Skin: Skin is warm and dry. No rash noted.   Cardiovascular: Normal heart rate noted  Respiratory: Normal respiratory effort, no problems with respiration noted  Abdomen: Soft, gravid, appropriate for gestational age.  Pain/Pressure: Present     Pelvic: Cervical exam deferred        Extremities: Normal range of motion.  Edema: Trace  Mental Status: Normal mood and affect. Normal behavior. Normal judgment and thought content.   Assessment and Plan:  Pregnancy: A5W0981 at [redacted]w[redacted]d 1. Chronic hypertension affecting pregnancy BP WNL today On procardia   2. Grand multipara High PPH risk  3. Obesity affecting pregnancy in third trimester, unspecified obesity type TWG=23 lb 11.2 oz (10.8 kg) which is above goal  4. History of preterm delivery  5. Supervision of high risk pregnancy, antepartum Up to date  Vigorous movement  6. Insulin  controlled gestational diabetes mellitus (GDM) in third trimester Fasting 94-119-- all excpet 94 value are above goal PP 115-160, 18 of 36 are abnormal Fetal echo at [facility] WNL Has BPPs scheduled IOL at 38-39 weeks--> possibly at 37 wks for poorly controlled GDM Last growth on 9/25 EFW 2403g 84th%, AC >99th%  Current insulin dose: NPH 10 u QAM and Metformin   Change today: NPH 12u qAM and NPH 5 u at bedtime Continue: Metformin BID   Preterm labor symptoms and general obstetric precautions including but not limited to vaginal bleeding, contractions, leaking of fluid and fetal movement were reviewed in detail with the patient. Please refer to After Visit Summary for other counseling recommendations.   Return in about 2 weeks (around 03/30/2023) for Routine prenatal care, MD only.  Future Appointments  Date Time Provider Department Center  03/18/2023  9:30 AM [facility] NURSE [facility]  03/18/2023  9:45 AM [facility] NST [facility]  03/25/2023  9:15 AM [facility] NURSE [facility]  03/25/2023  9:30 AM [facility] US2 [facility]  03/31/2023  1:15 PM Venora Maples, MD [facility]  04/01/2023  9:15 AM [facility] NURSE [facility]  04/01/2023  9:30 AM [facility] US2 [facility]  04/07/2023  3:15 PM Venora Maples, MD [facility]  04/14/2023  8:15 AM Venora Maples, MD [facility]  04/22/2023  8:15 AM [place] Bing, MD [facility]    Federico Flake, MD

## 2023-03-18 ENCOUNTER — Ambulatory Visit: Payer: Self-pay

## 2023-03-18 ENCOUNTER — Ambulatory Visit: Payer: Self-pay | Attending: Maternal & Fetal Medicine | Admitting: *Deleted

## 2023-03-18 ENCOUNTER — Ambulatory Visit: Payer: Self-pay | Admitting: *Deleted

## 2023-03-18 VITALS — BP 121/70 | HR 91

## 2023-03-18 DIAGNOSIS — O0990 Supervision of high risk pregnancy, unspecified, unspecified trimester: Secondary | ICD-10-CM

## 2023-03-18 DIAGNOSIS — O24414 Gestational diabetes mellitus in pregnancy, insulin controlled: Secondary | ICD-10-CM

## 2023-03-18 DIAGNOSIS — O09523 Supervision of elderly multigravida, third trimester: Secondary | ICD-10-CM | POA: Insufficient documentation

## 2023-03-18 DIAGNOSIS — O10913 Unspecified pre-existing hypertension complicating pregnancy, third trimester: Secondary | ICD-10-CM

## 2023-03-18 DIAGNOSIS — O10013 Pre-existing essential hypertension complicating pregnancy, third trimester: Secondary | ICD-10-CM | POA: Insufficient documentation

## 2023-03-18 DIAGNOSIS — O10919 Unspecified pre-existing hypertension complicating pregnancy, unspecified trimester: Secondary | ICD-10-CM

## 2023-03-18 DIAGNOSIS — Z8751 Personal history of pre-term labor: Secondary | ICD-10-CM

## 2023-03-18 DIAGNOSIS — Z641 Problems related to multiparity: Secondary | ICD-10-CM

## 2023-03-18 DIAGNOSIS — O24419 Gestational diabetes mellitus in pregnancy, unspecified control: Secondary | ICD-10-CM | POA: Insufficient documentation

## 2023-03-18 DIAGNOSIS — Z3A34 34 weeks gestation of pregnancy: Secondary | ICD-10-CM | POA: Insufficient documentation

## 2023-03-18 NOTE — Procedures (Signed)
Monica Wolfe [birth year] 104w6d  Fetus A Non-Stress Test Interpretation for 03/18/23-NST only  Indication: Chronic Hypertenstion, Gestational Diabetes medication controlled, and Advanced Maternal Age >40 years  Fetal Heart Rate A Mode: External Baseline Rate (A): 150 bpm Variability: Moderate Accelerations: 15 x 15 Decelerations: None Multiple birth?: No  Uterine Activity Mode: Toco Contraction Frequency (min): none Resting Tone Palpated: Relaxed  Interpretation (Fetal Testing) Nonstress Test Interpretation: Reactive Comments: Tracing reviewed byDr. booker

## 2023-03-25 ENCOUNTER — Other Ambulatory Visit: Payer: Self-pay

## 2023-03-25 ENCOUNTER — Ambulatory Visit (HOSPITAL_BASED_OUTPATIENT_CLINIC_OR_DEPARTMENT_OTHER): Payer: Self-pay

## 2023-03-25 ENCOUNTER — Ambulatory Visit: Payer: Self-pay | Attending: Obstetrics and Gynecology | Admitting: *Deleted

## 2023-03-25 VITALS — BP 136/77 | HR 97

## 2023-03-25 DIAGNOSIS — O24414 Gestational diabetes mellitus in pregnancy, insulin controlled: Secondary | ICD-10-CM | POA: Insufficient documentation

## 2023-03-25 DIAGNOSIS — O0943 Supervision of pregnancy with grand multiparity, third trimester: Secondary | ICD-10-CM | POA: Insufficient documentation

## 2023-03-25 DIAGNOSIS — O09293 Supervision of pregnancy with other poor reproductive or obstetric history, third trimester: Secondary | ICD-10-CM | POA: Insufficient documentation

## 2023-03-25 DIAGNOSIS — O09213 Supervision of pregnancy with history of pre-term labor, third trimester: Secondary | ICD-10-CM | POA: Insufficient documentation

## 2023-03-25 DIAGNOSIS — O289 Unspecified abnormal findings on antenatal screening of mother: Secondary | ICD-10-CM | POA: Insufficient documentation

## 2023-03-25 DIAGNOSIS — O0990 Supervision of high risk pregnancy, unspecified, unspecified trimester: Secondary | ICD-10-CM

## 2023-03-25 DIAGNOSIS — O10013 Pre-existing essential hypertension complicating pregnancy, third trimester: Secondary | ICD-10-CM | POA: Insufficient documentation

## 2023-03-25 DIAGNOSIS — O24419 Gestational diabetes mellitus in pregnancy, unspecified control: Secondary | ICD-10-CM

## 2023-03-25 DIAGNOSIS — O99213 Obesity complicating pregnancy, third trimester: Secondary | ICD-10-CM

## 2023-03-25 DIAGNOSIS — O283 Abnormal ultrasonic finding on antenatal screening of mother: Secondary | ICD-10-CM

## 2023-03-25 DIAGNOSIS — Z3A35 35 weeks gestation of pregnancy: Secondary | ICD-10-CM | POA: Insufficient documentation

## 2023-03-25 DIAGNOSIS — Z8751 Personal history of pre-term labor: Secondary | ICD-10-CM

## 2023-03-25 DIAGNOSIS — O10919 Unspecified pre-existing hypertension complicating pregnancy, unspecified trimester: Secondary | ICD-10-CM

## 2023-03-25 DIAGNOSIS — O09523 Supervision of elderly multigravida, third trimester: Secondary | ICD-10-CM

## 2023-03-25 DIAGNOSIS — E669 Obesity, unspecified: Secondary | ICD-10-CM

## 2023-03-25 DIAGNOSIS — Z641 Problems related to multiparity: Secondary | ICD-10-CM

## 2023-03-25 DIAGNOSIS — O10913 Unspecified pre-existing hypertension complicating pregnancy, third trimester: Secondary | ICD-10-CM

## 2023-03-31 ENCOUNTER — Other Ambulatory Visit: Payer: Self-pay

## 2023-03-31 ENCOUNTER — Encounter: Payer: Self-pay | Admitting: Family Medicine

## 2023-03-31 ENCOUNTER — Other Ambulatory Visit (HOSPITAL_COMMUNITY)
Admission: RE | Admit: 2023-03-31 | Discharge: 2023-03-31 | Disposition: A | Discharge status: Home / Self Care | Payer: Self-pay | Source: Ambulatory Visit | Attending: Family Medicine | Admitting: Family Medicine

## 2023-03-31 ENCOUNTER — Ambulatory Visit (INDEPENDENT_AMBULATORY_CARE_PROVIDER_SITE_OTHER): Payer: Self-pay | Admitting: Family Medicine

## 2023-03-31 VITALS — BP 136/90 | HR 91 | Wt 240.4 lb

## 2023-03-31 DIAGNOSIS — O0990 Supervision of high risk pregnancy, unspecified, unspecified trimester: Secondary | ICD-10-CM

## 2023-03-31 DIAGNOSIS — O10919 Unspecified pre-existing hypertension complicating pregnancy, unspecified trimester: Secondary | ICD-10-CM

## 2023-03-31 DIAGNOSIS — O99213 Obesity complicating pregnancy, third trimester: Secondary | ICD-10-CM

## 2023-03-31 DIAGNOSIS — Z8751 Personal history of pre-term labor: Secondary | ICD-10-CM

## 2023-03-31 DIAGNOSIS — Z641 Problems related to multiparity: Secondary | ICD-10-CM

## 2023-03-31 DIAGNOSIS — O24414 Gestational diabetes mellitus in pregnancy, insulin controlled: Secondary | ICD-10-CM

## 2023-03-31 DIAGNOSIS — R809 Proteinuria, unspecified: Secondary | ICD-10-CM

## 2023-03-31 DIAGNOSIS — R300 Dysuria: Secondary | ICD-10-CM

## 2023-03-31 DIAGNOSIS — I10 Essential (primary) hypertension: Secondary | ICD-10-CM

## 2023-03-31 MED ORDER — NIFEDIPINE ER OSMOTIC RELEASE 30 MG PO TB24: 30.0000 mg | ORAL_TABLET | Freq: Two times a day (BID) | ORAL | 5 refills | Status: DC

## 2023-03-31 NOTE — Addendum Note (Signed)
Addended by: Marjo Bicker on: 03/31/2023 04:51 PM   Modules accepted: Orders

## 2023-03-31 NOTE — Progress Notes (Signed)
   Subjective:  Monica Wolfe is a 37 y.o. [phone] at [redacted]w[redacted]d being seen today for ongoing prenatal care.  She is currently monitored for the following issues for this high-risk pregnancy and has GDM (gestational diabetes mellitus); History of preterm delivery; Supervision of high risk pregnancy, antepartum; Chronic hypertension affecting pregnancy; Grand multipara; Obesity affecting pregnancy; and Proteinuria on their problem list.  Patient reports no complaints.  Contractions: Irritability. Vag. Bleeding: None.  Movement: Present. Denies leaking of fluid.   The following portions of the patient's history were reviewed and updated as appropriate: allergies, current medications, past family history, past medical history, past social history, past surgical history and problem list. Problem list updated.  Objective:   Vitals:   03/31/23 1320  BP: (!) 136/90  Pulse: 91  Weight: 240 lb 6.4 oz (109 kg)    Fetal Status: Fetal Heart Rate (bpm): 142   Movement: Present     General:  Alert, oriented and cooperative. Patient is in no acute distress.  Skin: Skin is warm and dry. No rash noted.   Cardiovascular: Normal heart rate noted  Respiratory: Normal respiratory effort, no problems with respiration noted  Abdomen: Soft, gravid, appropriate for gestational age. Pain/Pressure: Present (pelvic)     Pelvic: Vag. Bleeding: None     Cervical exam performed Dilation: 3 Effacement (%): Thick Station: -2  Extremities: Normal range of motion.  Edema: Trace  Mental Status: Normal mood and affect. Normal behavior. Normal judgment and thought content.   Urinalysis:        Assessment and Plan:  Pregnancy: [id number] at [redacted]w[redacted]d  1. Supervision of high risk pregnancy, antepartum BP elevated, see below FHR normal Cervix 3/Thick/-2 - GC/Chlamydia probe amp ([place])not at [facility] - Culture, beta strep (group b only)  2. Insulin controlled gestational diabetes mellitus (GDM) in third  trimester On NPH 15/5 Fairly good control, see log above Last growth [place] 03/04/2023, EFW 84%, AC>99%, AFI 15  3. Chronic hypertension affecting pregnancy Having intermittent headaches, none currently Currently no vision changes, chest pain, SOB, RUQ pain Mild range BP's increasing, given worsening BP will increase Nifed to 30 BID and also schedule IOL at 37 weeks Continue ASA Form faxed, orders placed  4. Grand multipara NSVD x5 Plan for txa at delivery  5. History of preterm delivery At 30 weeks in setting of PPROM, remainder term  6. Proteinuria, unspecified type UPCR at 23 weeks 519  7. Obesity affecting pregnancy in third trimester, unspecified obesity type   Preterm labor symptoms and general obstetric precautions including but not limited to vaginal bleeding, contractions, leaking of fluid and fetal movement were reviewed in detail with the patient. Please refer to After Visit Summary for other counseling recommendations.  Return in 1 week (on 04/07/2023) for [facility], ob visit.   Venora Maples, MD

## 2023-04-01 ENCOUNTER — Other Ambulatory Visit: Payer: Self-pay

## 2023-04-01 ENCOUNTER — Ambulatory Visit: Payer: Self-pay | Attending: Obstetrics and Gynecology | Admitting: *Deleted

## 2023-04-01 ENCOUNTER — Ambulatory Visit (HOSPITAL_BASED_OUTPATIENT_CLINIC_OR_DEPARTMENT_OTHER): Payer: Self-pay

## 2023-04-01 VITALS — BP 129/88 | HR 94

## 2023-04-01 DIAGNOSIS — O10013 Pre-existing essential hypertension complicating pregnancy, third trimester: Secondary | ICD-10-CM

## 2023-04-01 DIAGNOSIS — O99213 Obesity complicating pregnancy, third trimester: Secondary | ICD-10-CM | POA: Insufficient documentation

## 2023-04-01 DIAGNOSIS — O10913 Unspecified pre-existing hypertension complicating pregnancy, third trimester: Secondary | ICD-10-CM

## 2023-04-01 DIAGNOSIS — O10919 Unspecified pre-existing hypertension complicating pregnancy, unspecified trimester: Secondary | ICD-10-CM

## 2023-04-01 DIAGNOSIS — O283 Abnormal ultrasonic finding on antenatal screening of mother: Secondary | ICD-10-CM

## 2023-04-01 DIAGNOSIS — O24419 Gestational diabetes mellitus in pregnancy, unspecified control: Secondary | ICD-10-CM | POA: Insufficient documentation

## 2023-04-01 DIAGNOSIS — O09293 Supervision of pregnancy with other poor reproductive or obstetric history, third trimester: Secondary | ICD-10-CM | POA: Insufficient documentation

## 2023-04-01 DIAGNOSIS — Z3A36 36 weeks gestation of pregnancy: Secondary | ICD-10-CM

## 2023-04-01 DIAGNOSIS — O42913 Preterm premature rupture of membranes, unspecified as to length of time between rupture and onset of labor, third trimester: Secondary | ICD-10-CM | POA: Insufficient documentation

## 2023-04-01 DIAGNOSIS — O3663X0 Maternal care for excessive fetal growth, third trimester, not applicable or unspecified: Secondary | ICD-10-CM | POA: Insufficient documentation

## 2023-04-01 DIAGNOSIS — O09523 Supervision of elderly multigravida, third trimester: Secondary | ICD-10-CM

## 2023-04-01 DIAGNOSIS — O0943 Supervision of pregnancy with grand multiparity, third trimester: Secondary | ICD-10-CM

## 2023-04-01 DIAGNOSIS — Z641 Problems related to multiparity: Secondary | ICD-10-CM

## 2023-04-01 DIAGNOSIS — O24414 Gestational diabetes mellitus in pregnancy, insulin controlled: Secondary | ICD-10-CM

## 2023-04-01 DIAGNOSIS — E669 Obesity, unspecified: Secondary | ICD-10-CM

## 2023-04-01 DIAGNOSIS — O09213 Supervision of pregnancy with history of pre-term labor, third trimester: Secondary | ICD-10-CM

## 2023-04-01 DIAGNOSIS — Z8751 Personal history of pre-term labor: Secondary | ICD-10-CM

## 2023-04-01 DIAGNOSIS — O0990 Supervision of high risk pregnancy, unspecified, unspecified trimester: Secondary | ICD-10-CM

## 2023-04-01 LAB — COMPREHENSIVE METABOLIC PANEL
ALT: 13 [IU]/L (ref 0–32)
AST: 23 [IU]/L (ref 0–40)
Albumin: 3.6 g/dL — ABNORMAL LOW (ref 3.9–4.9)
Alkaline Phosphatase: 124 [IU]/L — ABNORMAL HIGH (ref 44–121)
BUN/Creatinine Ratio: 14 (ref 9–23)
BUN: 7 mg/dL (ref 6–20)
Bilirubin Total: 0.4 mg/dL (ref 0.0–1.2)
CO2: 19 mmol/L — ABNORMAL LOW (ref 20–29)
Calcium: 9.5 mg/dL (ref 8.7–10.2)
Chloride: 102 mmol/L (ref 96–106)
Creatinine, Ser: 0.5 mg/dL — ABNORMAL LOW (ref 0.57–1.00)
Globulin, Total: 3.3 g/dL (ref 1.5–4.5)
Glucose: 74 mg/dL (ref 70–99)
Potassium: 5 mmol/L (ref 3.5–5.2)
Sodium: 137 mmol/L (ref 134–144)
Total Protein: 6.9 g/dL (ref 6.0–8.5)
eGFR: 125 mL/min/{1.73_m2} (ref >59)

## 2023-04-01 LAB — CBC
Hematocrit: 38.3 % (ref 34.0–46.6)
Hemoglobin: 12.6 g/dL (ref 11.1–15.9)
MCH: 30.4 pg (ref 26.6–33.0)
MCHC: 32.9 g/dL (ref 31.5–35.7)
MCV: 92 fL (ref 79–97)
Platelets: 216 10*3/uL (ref 150–450)
RBC: 4.15 x10E6/uL (ref 3.77–5.28)
RDW: 13.1 % (ref 11.7–15.4)
WBC: 9 10*3/uL (ref 3.4–10.8)

## 2023-04-01 LAB — POCT URINALYSIS DIP (DEVICE)
Bilirubin Urine: NEGATIVE
Glucose, UA: NEGATIVE mg/dL
Nitrite: NEGATIVE
Protein, ur: 100 mg/dL — AB
Specific Gravity, Urine: >=1.03 (ref 1.005–1.030)
Urobilinogen, UA: 0.2 mg/dL (ref 0.0–1.0)
pH: 6 (ref 5.0–8.0)

## 2023-04-01 LAB — GC/CHLAMYDIA PROBE AMP (~~LOC~~) NOT AT ARMC
Chlamydia: NEGATIVE
Comment: NEGATIVE
Comment: NORMAL
Neisseria Gonorrhea: NEGATIVE

## 2023-04-01 LAB — PROTEIN / CREATININE RATIO, URINE
Creatinine, Urine: 163 mg/dL
Protein, Ur: 138.4 mg/dL
Protein/Creat Ratio: 849 mg/g{creat} — ABNORMAL HIGH (ref 0–200)

## 2023-04-02 ENCOUNTER — Encounter (HOSPITAL_COMMUNITY): Payer: Self-pay | Admitting: Family Medicine

## 2023-04-02 ENCOUNTER — Inpatient Hospital Stay (HOSPITAL_COMMUNITY)
Admission: RE | Admit: 2023-04-02 | Discharge: 2023-04-04 | DRG: 768 | Disposition: A | Discharge status: Home / Self Care | Payer: Medicaid Other | Attending: Obstetrics & Gynecology | Admitting: Obstetrics & Gynecology

## 2023-04-02 ENCOUNTER — Inpatient Hospital Stay (HOSPITAL_COMMUNITY): Payer: Medicaid Other

## 2023-04-02 ENCOUNTER — Inpatient Hospital Stay (HOSPITAL_COMMUNITY): Payer: Medicaid Other | Admitting: Anesthesiology

## 2023-04-02 DIAGNOSIS — O10919 Unspecified pre-existing hypertension complicating pregnancy, unspecified trimester: Secondary | ICD-10-CM | POA: Diagnosis present

## 2023-04-02 DIAGNOSIS — O3663X0 Maternal care for excessive fetal growth, third trimester, not applicable or unspecified: Secondary | ICD-10-CM | POA: Diagnosis present

## 2023-04-02 DIAGNOSIS — Z833 Family history of diabetes mellitus: Secondary | ICD-10-CM | POA: Diagnosis not present

## 2023-04-02 DIAGNOSIS — O1002 Pre-existing essential hypertension complicating childbirth: Secondary | ICD-10-CM

## 2023-04-02 DIAGNOSIS — D62 Acute posthemorrhagic anemia: Secondary | ICD-10-CM | POA: Diagnosis not present

## 2023-04-02 DIAGNOSIS — Z87440 Personal history of urinary (tract) infections: Secondary | ICD-10-CM | POA: Diagnosis not present

## 2023-04-02 DIAGNOSIS — Z87442 Personal history of urinary calculi: Secondary | ICD-10-CM | POA: Diagnosis not present

## 2023-04-02 DIAGNOSIS — O1092 Unspecified pre-existing hypertension complicating childbirth: Secondary | ICD-10-CM | POA: Diagnosis present

## 2023-04-02 DIAGNOSIS — O24414 Gestational diabetes mellitus in pregnancy, insulin controlled: Secondary | ICD-10-CM

## 2023-04-02 DIAGNOSIS — O99210 Obesity complicating pregnancy, unspecified trimester: Secondary | ICD-10-CM | POA: Diagnosis present

## 2023-04-02 DIAGNOSIS — Z8249 Family history of ischemic heart disease and other diseases of the circulatory system: Secondary | ICD-10-CM | POA: Diagnosis not present

## 2023-04-02 DIAGNOSIS — Z641 Problems related to multiparity: Secondary | ICD-10-CM

## 2023-04-02 DIAGNOSIS — Z3A37 37 weeks gestation of pregnancy: Secondary | ICD-10-CM

## 2023-04-02 DIAGNOSIS — O24419 Gestational diabetes mellitus in pregnancy, unspecified control: Secondary | ICD-10-CM | POA: Diagnosis present

## 2023-04-02 DIAGNOSIS — Z8616 Personal history of COVID-19: Secondary | ICD-10-CM | POA: Diagnosis not present

## 2023-04-02 DIAGNOSIS — O99214 Obesity complicating childbirth: Secondary | ICD-10-CM | POA: Diagnosis present

## 2023-04-02 DIAGNOSIS — R809 Proteinuria, unspecified: Secondary | ICD-10-CM | POA: Diagnosis present

## 2023-04-02 DIAGNOSIS — O24420 Gestational diabetes mellitus in childbirth, diet controlled: Secondary | ICD-10-CM

## 2023-04-02 DIAGNOSIS — O0990 Supervision of high risk pregnancy, unspecified, unspecified trimester: Secondary | ICD-10-CM

## 2023-04-02 DIAGNOSIS — O24425 Gestational diabetes mellitus in childbirth, controlled by oral hypoglycemic drugs: Secondary | ICD-10-CM | POA: Diagnosis present

## 2023-04-02 DIAGNOSIS — O9081 Anemia of the puerperium: Secondary | ICD-10-CM | POA: Diagnosis not present

## 2023-04-02 LAB — COMPREHENSIVE METABOLIC PANEL
ALT: 15 U/L (ref 0–44)
ALT: 13 U/L (ref 0–44)
AST: 23 U/L (ref 15–41)
AST: 19 U/L (ref 15–41)
Albumin: 2.7 g/dL — ABNORMAL LOW (ref 3.5–5.0)
Albumin: 2.7 g/dL — ABNORMAL LOW (ref 3.5–5.0)
Alkaline Phosphatase: 95 U/L (ref 38–126)
Alkaline Phosphatase: 94 U/L (ref 38–126)
Anion gap: 9 (ref 5–15)
Anion gap: 10 (ref 5–15)
BUN: 6 mg/dL (ref 6–20)
BUN: 6 mg/dL (ref 6–20)
CO2: 21 mmol/L — ABNORMAL LOW (ref 22–32)
CO2: 25 mmol/L (ref 22–32)
Calcium: 8.4 mg/dL — ABNORMAL LOW (ref 8.9–10.3)
Calcium: 8.8 mg/dL — ABNORMAL LOW (ref 8.9–10.3)
Chloride: 102 mmol/L (ref 98–111)
Chloride: 100 mmol/L (ref 98–111)
Creatinine, Ser: 0.48 mg/dL (ref 0.44–1.00)
Creatinine, Ser: 0.51 mg/dL (ref 0.44–1.00)
GFR, Estimated: >60 mL/min (ref >60)
GFR, Estimated: >60 mL/min (ref >60)
Glucose, Bld: 113 mg/dL — ABNORMAL HIGH (ref 70–99)
Glucose, Bld: 86 mg/dL (ref 70–99)
Potassium: 3.9 mmol/L (ref 3.5–5.1)
Potassium: 5.1 mmol/L (ref 3.5–5.1)
Sodium: 132 mmol/L — ABNORMAL LOW (ref 135–145)
Sodium: 135 mmol/L (ref 135–145)
Total Bilirubin: 0.6 mg/dL (ref 0.3–1.2)
Total Bilirubin: 0.5 mg/dL (ref 0.3–1.2)
Total Protein: 6.7 g/dL (ref 6.5–8.1)
Total Protein: 6.8 g/dL (ref 6.5–8.1)

## 2023-04-02 LAB — RPR: RPR Ser Ql: NONREACTIVE

## 2023-04-02 LAB — TYPE AND SCREEN
ABO/RH(D): O POS
Antibody Screen: NEGATIVE

## 2023-04-02 LAB — PROTEIN / CREATININE RATIO, URINE
Creatinine, Urine: 140 mg/dL
Protein Creatinine Ratio: 0.73 mg/mg{creat} — ABNORMAL HIGH (ref 0.00–0.15)
Total Protein, Urine: 102 mg/dL

## 2023-04-02 LAB — CBC
HCT: 37.3 % (ref 36.0–46.0)
HCT: 37.3 % (ref 36.0–46.0)
Hemoglobin: 12.4 g/dL (ref 12.0–15.0)
Hemoglobin: 12.2 g/dL (ref 12.0–15.0)
MCH: 30.2 pg (ref 26.0–34.0)
MCH: 29.8 pg (ref 26.0–34.0)
MCHC: 33.2 g/dL (ref 30.0–36.0)
MCHC: 32.7 g/dL (ref 30.0–36.0)
MCV: 91 fL (ref 80.0–100.0)
MCV: 91 fL (ref 80.0–100.0)
Platelets: 231 10*3/uL (ref 150–400)
Platelets: 227 10*3/uL (ref 150–400)
RBC: 4.1 MIL/uL (ref 3.87–5.11)
RBC: 4.1 MIL/uL (ref 3.87–5.11)
RDW: 13.4 % (ref 11.5–15.5)
RDW: 13.4 % (ref 11.5–15.5)
WBC: 10.1 10*3/uL (ref 4.0–10.5)
WBC: 10.6 10*3/uL — ABNORMAL HIGH (ref 4.0–10.5)
nRBC: 0 % (ref 0.0–0.2)
nRBC: 0 % (ref 0.0–0.2)

## 2023-04-02 LAB — GLUCOSE, CAPILLARY
Glucose-Capillary: 85 mg/dL (ref 70–99)
Glucose-Capillary: 77 mg/dL (ref 70–99)
Glucose-Capillary: 75 mg/dL (ref 70–99)
Glucose-Capillary: 82 mg/dL (ref 70–99)

## 2023-04-02 MED ORDER — LACTATED RINGERS IV SOLN: 500.0000 mL | Freq: Once | INTRAVENOUS | Status: DC

## 2023-04-02 MED ORDER — HYDRALAZINE HCL 20 MG/ML IJ SOLN: 10.0000 mg | INTRAMUSCULAR | Status: DC | PRN

## 2023-04-02 MED ORDER — LACTATED RINGERS IV SOLN: INTRAVENOUS | Status: DC

## 2023-04-02 MED ORDER — MISOPROSTOL 200 MCG PO TABS
800.0000 ug | ORAL_TABLET | Freq: Once | ORAL | Status: AC
  Administered 2023-04-02: 800 ug via RECTAL

## 2023-04-02 MED ORDER — METHYLERGONOVINE MALEATE 0.2 MG/ML IJ SOLN
0.2000 mg | Freq: Once | INTRAMUSCULAR | Status: AC
  Administered 2023-04-02: 0.2 mg via INTRAMUSCULAR

## 2023-04-02 MED ORDER — OXYTOCIN BOLUS FROM INFUSION
333.0000 mL | Freq: Once | INTRAVENOUS | Status: AC
  Administered 2023-04-02: 333 mL via INTRAVENOUS

## 2023-04-02 MED ORDER — PHENYLEPHRINE 80 MCG/ML (10ML) SYRINGE FOR IV PUSH (FOR BLOOD PRESSURE SUPPORT): 80.0000 ug | PREFILLED_SYRINGE | INTRAVENOUS | Status: DC | PRN

## 2023-04-02 MED ORDER — LABETALOL HCL 5 MG/ML IV SOLN: 40.0000 mg | INTRAVENOUS | Status: DC | PRN

## 2023-04-02 MED ORDER — TERBUTALINE SULFATE 1 MG/ML IJ SOLN: 0.2500 mg | Freq: Once | INTRAMUSCULAR | Status: DC | PRN

## 2023-04-02 MED ORDER — OXYTOCIN-SODIUM CHLORIDE 30-0.9 UT/500ML-% IV SOLN: 1.0000 m[IU]/min | INTRAVENOUS | Status: DC

## 2023-04-02 MED ORDER — NIFEDIPINE ER OSMOTIC RELEASE 30 MG PO TB24: 30.0000 mg | ORAL_TABLET | Freq: Every day | ORAL | Status: DC

## 2023-04-02 MED ORDER — FENTANYL CITRATE (PF) 100 MCG/2ML IJ SOLN
50.0000 ug | INTRAMUSCULAR | Status: DC | PRN
  Administered 2023-04-02: 100 ug via INTRAVENOUS
  Filled 2023-04-02: qty 2

## 2023-04-02 MED ORDER — LABETALOL HCL 5 MG/ML IV SOLN: 80.0000 mg | INTRAVENOUS | Status: DC | PRN

## 2023-04-02 MED ORDER — SOD CITRATE-CITRIC ACID 500-334 MG/5ML PO SOLN
30.0000 mL | ORAL | Status: DC | PRN
Start: 2023-04-02 — End: 2023-04-03

## 2023-04-02 MED ORDER — FENTANYL-BUPIVACAINE-NACL 0.5-0.125-0.9 MG/250ML-% EP SOLN
12.0000 mL/h | EPIDURAL | Status: DC | PRN
  Administered 2023-04-02: 12 mL/h via EPIDURAL
  Filled 2023-04-02: qty 250

## 2023-04-02 MED ORDER — LABETALOL HCL 5 MG/ML IV SOLN
80.0000 mg | INTRAVENOUS | Status: DC | PRN
Start: 2023-04-02 — End: 2023-04-03

## 2023-04-02 MED ORDER — LABETALOL HCL 5 MG/ML IV SOLN
40.0000 mg | INTRAVENOUS | Status: DC | PRN
Start: 2023-04-02 — End: 2023-04-03

## 2023-04-02 MED ORDER — TRANEXAMIC ACID-NACL 1000-0.7 MG/100ML-% IV SOLN
1000.0000 mg | Freq: Once | INTRAVENOUS | Status: AC
  Administered 2023-04-02: 1000 mg via INTRAVENOUS
  Filled 2023-04-02: qty 100

## 2023-04-02 MED ORDER — EPHEDRINE 5 MG/ML INJ: 10.0000 mg | INTRAVENOUS | Status: DC | PRN

## 2023-04-02 MED ORDER — MISOPROSTOL 200 MCG PO TABS
ORAL_TABLET | ORAL | Status: AC
  Filled 2023-04-02: qty 4

## 2023-04-02 MED ORDER — ONDANSETRON HCL 4 MG/2ML IJ SOLN: 4.0000 mg | Freq: Four times a day (QID) | INTRAMUSCULAR | Status: DC | PRN

## 2023-04-02 MED ORDER — METHYLERGONOVINE MALEATE 0.2 MG/ML IJ SOLN
INTRAMUSCULAR | Status: AC
  Filled 2023-04-02: qty 1

## 2023-04-02 MED ORDER — MAGNESIUM SULFATE 40 GM/1000ML IV SOLN
2.0000 g/h | INTRAVENOUS | Status: DC
Start: 2023-04-02 — End: 2023-04-02

## 2023-04-02 MED ORDER — MAGNESIUM SULFATE BOLUS VIA INFUSION
6.0000 g | Freq: Once | INTRAVENOUS | Status: DC
  Filled 2023-04-02: qty 1000

## 2023-04-02 MED ORDER — METFORMIN HCL 500 MG PO TABS
1000.0000 mg | ORAL_TABLET | Freq: Two times a day (BID) | ORAL | Status: DC
  Administered 2023-04-02: 1000 mg via ORAL
  Filled 2023-04-02 (×2): qty 2

## 2023-04-02 MED ORDER — ACETAMINOPHEN 325 MG PO TABS
650.0000 mg | ORAL_TABLET | ORAL | Status: DC | PRN
Start: 2023-04-02 — End: 2023-04-03

## 2023-04-02 MED ORDER — LIDOCAINE HCL (PF) 1 % IJ SOLN
INTRAMUSCULAR | Status: DC | PRN
  Administered 2023-04-02: 2 mL via EPIDURAL
  Administered 2023-04-02: 5 mL via EPIDURAL
  Administered 2023-04-02: 3 mL via EPIDURAL

## 2023-04-02 MED ORDER — OXYTOCIN-SODIUM CHLORIDE 30-0.9 UT/500ML-% IV SOLN
1.0000 m[IU]/min | INTRAVENOUS | Status: DC
  Administered 2023-04-02: 2 m[IU]/min via INTRAVENOUS
  Filled 2023-04-02: qty 500

## 2023-04-02 MED ORDER — LABETALOL HCL 5 MG/ML IV SOLN
20.0000 mg | INTRAVENOUS | Status: DC | PRN
  Administered 2023-04-02: 20 mg via INTRAVENOUS
  Filled 2023-04-02: qty 4

## 2023-04-02 MED ORDER — DIPHENHYDRAMINE HCL 50 MG/ML IJ SOLN: 12.5000 mg | INTRAMUSCULAR | Status: DC | PRN

## 2023-04-02 MED ORDER — OXYTOCIN-SODIUM CHLORIDE 30-0.9 UT/500ML-% IV SOLN: 2.5000 [IU]/h | INTRAVENOUS | Status: DC

## 2023-04-02 MED ORDER — NIFEDIPINE ER OSMOTIC RELEASE 30 MG PO TB24
30.0000 mg | ORAL_TABLET | Freq: Two times a day (BID) | ORAL | Status: DC
  Administered 2023-04-02 – 2023-04-04 (×5): 30 mg via ORAL
  Filled 2023-04-02 (×5): qty 1

## 2023-04-02 MED ORDER — LIDOCAINE HCL (PF) 1 % IJ SOLN: 30.0000 mL | INTRAMUSCULAR | Status: DC | PRN

## 2023-04-02 MED ORDER — SODIUM CHLORIDE 0.9% FLUSH
10.0000 mL | Freq: Two times a day (BID) | INTRAVENOUS | Status: DC
Start: 2023-04-02 — End: 2023-04-03

## 2023-04-02 MED ORDER — LABETALOL HCL 5 MG/ML IV SOLN
20.0000 mg | INTRAVENOUS | Status: DC | PRN
Start: 2023-04-02 — End: 2023-04-03

## 2023-04-02 MED ORDER — CEFAZOLIN SODIUM-DEXTROSE 2-4 GM/100ML-% IV SOLN
2.0000 g | Freq: Once | INTRAVENOUS | Status: AC
  Administered 2023-04-02: 2 g via INTRAVENOUS
  Filled 2023-04-02: qty 100

## 2023-04-02 NOTE — Anesthesia Procedure Notes (Signed)
Epidural Patient location during procedure: OB Start time: 04/02/2023 1:28 PM End time: 04/02/2023 1:35 PM  Staffing Anesthesiologist: Collene Schlichter, MD Performed: anesthesiologist   Preanesthetic Checklist Completed: patient identified, IV checked, risks and benefits discussed, monitors and equipment checked, pre-op evaluation and timeout performed  Epidural Patient position: sitting Prep: DuraPrep Patient monitoring: blood pressure and continuous pulse ox Approach: midline Location: L3-L4 Injection technique: LOR air  Needle:  Needle type: Tuohy  Needle gauge: 17 G Needle length: 9 cm Needle insertion depth: 5 cm Catheter size: 19 Gauge Catheter at skin depth: 10 cm Test dose: negative and Other (1% Lidocaine)  Additional Notes Patient identified.  Risk benefits discussed including failed block, incomplete pain control, headache, nerve damage, paralysis, blood pressure changes, nausea, vomiting, reactions to medication both toxic or allergic, and postpartum back pain.  Patient expressed understanding and wished to proceed.  All questions were answered.  Sterile technique used throughout procedure and epidural site dressed with sterile barrier dressing. No paresthesia or other complications noted. The patient did not experience any signs of intravascular injection such as tinnitus or metallic taste in mouth nor signs of intrathecal spread such as rapid motor block. Please see nursing notes for vital signs. Reason for block:procedure for pain

## 2023-04-02 NOTE — Anesthesia Preprocedure Evaluation (Signed)
Anesthesia Evaluation  Patient identified by MRN, date of birth, ID band Patient awake    Reviewed: Allergy & Precautions, NPO status , Patient's Chart, lab work & pertinent test results  Airway Mallampati: III  TM Distance: >3 FB Neck ROM: Full    Dental  (+) Teeth Intact, Dental Advisory Given   Pulmonary neg pulmonary ROS   Pulmonary exam normal breath sounds clear to auscultation       Cardiovascular hypertension, Pt. on medications Normal cardiovascular exam Rhythm:Regular Rate:Normal     Neuro/Psych negative neurological ROS     GI/Hepatic negative GI ROS, Neg liver ROS,,,  Endo/Other  negative endocrine ROSdiabetes, Gestational, Insulin Dependent  Obesity   Renal/GU negative Renal ROS     Musculoskeletal negative musculoskeletal ROS (+)    Abdominal   Peds  Hematology negative hematology ROS (+) Plt 231k   Anesthesia Other Findings Day of surgery medications reviewed with the patient.  Reproductive/Obstetrics (+) Pregnancy                             Anesthesia Physical Anesthesia Plan  ASA: 3  Anesthesia Plan: Epidural   Post-op Pain Management:    Induction:   PONV Risk Score and Plan: 2 and Treatment may vary due to age or medical condition  Airway Management Planned: Natural Airway  Additional Equipment:   Intra-op Plan:   Post-operative Plan:   Informed Consent: I have reviewed the patients History and Physical, chart, labs and discussed the procedure including the risks, benefits and alternatives for the proposed anesthesia with the patient or authorized representative who has indicated his/her understanding and acceptance.     Dental advisory given  Plan Discussed with:   Anesthesia Plan Comments: (Patient identified. Risks/Benefits/Options discussed with patient including but not limited to bleeding, infection, nerve damage, paralysis, failed block,  incomplete pain control, headache, blood pressure changes, nausea, vomiting, reactions to medication both or allergic, itching and postpartum back pain. Confirmed with bedside nurse the patient's most recent platelet count. Confirmed with patient that they are not currently taking any anticoagulation, have any bleeding history or any family history of bleeding disorders. Patient expressed understanding and wished to proceed. All questions were answered. )       Anesthesia Quick Evaluation

## 2023-04-02 NOTE — Discharge Summary (Signed)
Postpartum Discharge Summary    Patient Name: Monica Wolfe DOB: [birth year] MRN: [id number]  Date of admission: 04/02/2023 Delivery date:04/02/2023 Delivering provider: Joanne Gavel Date of discharge: 04/04/2023  Admitting diagnosis: Chronic hypertension affecting pregnancy [O10.919] Intrauterine pregnancy: [redacted]w[redacted]d     Secondary diagnosis:  Principal Problem:   SVD (spontaneous vaginal delivery) Active Problems:   GDM (gestational diabetes mellitus)   Supervision of high risk pregnancy, antepartum   Chronic hypertension affecting pregnancy   Grand multipara   Obesity affecting pregnancy   Proteinuria   PPH (postpartum hemorrhage)  Additional problems: None    Discharge diagnosis: Term Pregnancy Delivered                                              Post partum procedures: None Augmentation: AROM and Pitocin Complications: Hemorrhage>1071mL  Hospital course: Induction of Labor With Vaginal Delivery   37 y.o. [name] at [redacted]w[redacted]d was admitted to the hospital 04/02/2023 for induction of labor.  Indication for induction: A2 DM and cHTN .  Patient had an labor course complicated by none. Delivery c/b PPH with QBL 1574. Membrane Rupture Time/Date: 4:38 PM,04/02/2023  Delivery Method:Vaginal, Spontaneous Operative Delivery:N/A Episiotomy: None Lacerations:  None Details of delivery can be found in separate delivery note.  Patient had a postpartum course complicated by none. Patient is discharged home 04/04/23.  Newborn Data: Birth date:04/02/2023 Birth time:10:24 PM Gender:Female Living status:Living Apgars:8 ,9  Weight:3710 g  Magnesium Sulfate received: No BMZ received: No Rhophylac:No MMR:No T-DaP:Given prenatally Flu: No RSV Vaccine received: No Transfusion:No  Immunizations received: Immunization History  Administered Date(s) Administered   Influenza,inj,Quad PF,6+ Mos 05/31/2019   Tdap 08/29/2019    Physical exam  Vitals:   04/03/23  1357 04/03/23 1630 04/03/23 2016 04/04/23 0533  BP: 129/84 115/71 127/83 134/86  Pulse: 89 89 91 86  Resp:  18 18 18   Temp:  98.7 F (37.1 C) 98 F (36.7 C) 98 F (36.7 C)  TempSrc:  Oral Oral Oral  SpO2:  99% 100%   Weight:      Height:       General: alert, cooperative, and no distress Lochia: appropriate Uterine Fundus: firm Incision: N/A DVT Evaluation: No evidence of DVT seen on physical exam. Labs: Lab Results  Component Value Date   WBC 14.0 (H) 04/03/2023   HGB 11.1 (L) 04/03/2023   HCT 33.2 (L) 04/03/2023   MCV 92.0 04/03/2023   PLT 231 04/03/2023      Latest Ref Rng & Units 04/02/2023    4:29 PM  CMP  Glucose 70 - 99 mg/dL 86   BUN 6 - 20 mg/dL 6   Creatinine 1.47 - 8.29 mg/dL 5.62   Sodium 130 - 865 mmol/L 135   Potassium 3.5 - 5.1 mmol/L 5.1   Chloride 98 - 111 mmol/L 100   CO2 22 - 32 mmol/L 25   Calcium 8.9 - 10.3 mg/dL 8.8   Total Protein 6.5 - 8.1 g/dL 6.8   Total Bilirubin 0.3 - 1.2 mg/dL 0.5   Alkaline Phos 38 - 126 U/L 94   AST 15 - 41 U/L 19   ALT 0 - 44 U/L 13    Edinburgh Score:    04/03/2023    7:30 AM  Edinburgh Postnatal Depression Scale Screening Tool  I have been able to laugh and  see the funny side of things. 0  I have looked forward with enjoyment to things. 0  I have blamed myself unnecessarily when things went wrong. 0  I have been anxious or worried for no good reason. 0  I have felt scared or panicky for no good reason. 0  Things have been getting on top of me. 1  I have been so unhappy that I have had difficulty sleeping. 0  I have felt sad or miserable. 0  I have been so unhappy that I have been crying. 0  The thought of harming myself has occurred to me. 0  Edinburgh Postnatal Depression Scale Total 1   Edinburgh Postnatal Depression Scale Total: 1   After visit meds:  Allergies as of 04/04/2023   No Known Allergies      Medication List     STOP taking these medications    ASPIRIN 81 PO   insulin NPH  Human 100 UNIT/ML injection Commonly known as: NOVOLIN N   metFORMIN 500 MG tablet Commonly known as: GLUCOPHAGE       TAKE these medications    acetaminophen 500 MG tablet Commonly known as: TYLENOL Take 2 tablets (1,000 mg total) by mouth every 8 (eight) hours.   furosemide 40 MG tablet Commonly known as: LASIX Take 1 tablet (40 mg total) by mouth daily.   ibuprofen 800 MG tablet Commonly known as: ADVIL Take 1 tablet (800 mg total) by mouth 3 (three) times daily.   NIFEdipine 30 MG 24 hr tablet Commonly known as: PROCARDIA-XL/NIFEDICAL-XL Take 1 tablet (30 mg total) by mouth in the morning and at bedtime.   Prenatal Vitamins 28-0.8 MG Tabs Take 1 tablet by mouth daily.   senna-docusate 8.6-50 MG tablet Commonly known as: Senokot-S Take 2 tablets by mouth daily.         Discharge home in stable condition Infant Feeding: Bottle and Breast Infant Disposition:home with mother Discharge instruction: per After Visit Summary and Postpartum booklet. Activity: Advance as tolerated. Pelvic rest for 6 weeks.  Diet: routine diet Future Appointments:No future appointments. Follow up Visit:  Message sent to [facility] 10/24  Please schedule this patient for a In person postpartum visit in 6 weeks with the following provider: Any provider. Additional Postpartum F/U: BP check 1 week/early PPD screen, 6 week GTT   High risk pregnancy complicated by:  Monica Wolfe, A2GDM Delivery mode:  Vaginal, Spontaneous Anticipated Birth Control: Depo -> vasectomy   04/04/2023 Joanne Gavel, MD OB Fellow

## 2023-04-02 NOTE — Plan of Care (Signed)
Problem: Education: Goal: Knowledge of General Education information will improve Description: Including pain rating scale, medication(s)/side effects and non-pharmacologic comfort measures Outcome: Completed/Met   Problem: Health Behavior/Discharge Planning: Goal: Ability to manage health-related needs will improve Outcome: Completed/Met   Problem: Clinical Measurements: Goal: Ability to maintain clinical measurements within normal limits will improve Outcome: Completed/Met Goal: Will remain free from infection Outcome: Completed/Met Goal: Diagnostic test results will improve Outcome: Completed/Met Goal: Respiratory complications will improve Outcome: Completed/Met Goal: Cardiovascular complication will be avoided Outcome: Completed/Met   Problem: Activity: Goal: Risk for activity intolerance will decrease Outcome: Completed/Met   Problem: Nutrition: Goal: Adequate nutrition will be maintained Outcome: Completed/Met   Problem: Coping: Goal: Level of anxiety will decrease Outcome: Completed/Met   Problem: Elimination: Goal: Will not experience complications related to bowel motility Outcome: Completed/Met Goal: Will not experience complications related to urinary retention Outcome: Completed/Met   Problem: Pain Management: Goal: General experience of comfort will improve Outcome: Completed/Met   Problem: Safety: Goal: Ability to remain free from injury will improve Outcome: Completed/Met   Problem: Skin Integrity: Goal: Risk for impaired skin integrity will decrease Outcome: Completed/Met   Problem: Education: Goal: Knowledge of Childbirth will improve Outcome: Completed/Met Goal: Ability to make informed decisions regarding treatment and plan of care will improve Outcome: Completed/Met Goal: Ability to state and carry out methods to decrease the pain will improve Outcome: Completed/Met Goal: Individualized Educational Video(s) Outcome: Completed/Met    Problem: Coping: Goal: Ability to verbalize concerns and feelings about labor and delivery will improve Outcome: Completed/Met   Problem: Life Cycle: Goal: Ability to make normal progression through stages of labor will improve Outcome: Completed/Met Goal: Ability to effectively push during vaginal delivery will improve Outcome: Completed/Met   Problem: Role Relationship: Goal: Will demonstrate positive interactions with the child Outcome: Completed/Met   Problem: Safety: Goal: Risk of complications during labor and delivery will decrease Outcome: Completed/Met   Problem: Pain Management: Goal: Relief or control of pain from uterine contractions will improve Outcome: Completed/Met   Problem: Education: Goal: Knowledge of General Education information will improve Description: Including pain rating scale, medication(s)/side effects and non-pharmacologic comfort measures Outcome: Completed/Met   Problem: Health Behavior/Discharge Planning: Goal: Ability to manage health-related needs will improve Outcome: Completed/Met   Problem: Clinical Measurements: Goal: Ability to maintain clinical measurements within normal limits will improve Outcome: Completed/Met Goal: Will remain free from infection Outcome: Completed/Met Goal: Diagnostic test results will improve Outcome: Completed/Met Goal: Respiratory complications will improve Outcome: Completed/Met Goal: Cardiovascular complication will be avoided Outcome: Completed/Met   Problem: Activity: Goal: Risk for activity intolerance will decrease Outcome: Completed/Met   Problem: Nutrition: Goal: Adequate nutrition will be maintained Outcome: Completed/Met   Problem: Coping: Goal: Level of anxiety will decrease Outcome: Completed/Met   Problem: Elimination: Goal: Will not experience complications related to bowel motility Outcome: Completed/Met Goal: Will not experience complications related to urinary  retention Outcome: Completed/Met   Problem: Pain Management: Goal: General experience of comfort will improve Outcome: Completed/Met   Problem: Safety: Goal: Ability to remain free from injury will improve Outcome: Completed/Met   Problem: Skin Integrity: Goal: Risk for impaired skin integrity will decrease Outcome: Completed/Met   Problem: Education: Goal: Knowledge of Childbirth will improve Outcome: Completed/Met Goal: Ability to make informed decisions regarding treatment and plan of care will improve Outcome: Completed/Met Goal: Ability to state and carry out methods to decrease the pain will improve Outcome: Completed/Met Goal: Individualized Educational Video(s) Outcome: Completed/Met   Problem: Coping: Goal: Ability to  verbalize concerns and feelings about labor and delivery will improve Outcome: Completed/Met   Problem: Life Cycle: Goal: Ability to make normal progression through stages of labor will improve Outcome: Completed/Met Goal: Ability to effectively push during vaginal delivery will improve Outcome: Completed/Met

## 2023-04-02 NOTE — Progress Notes (Signed)
Labor Progress Note  Monica Wolfe is a 37 y.o. [phone] at [redacted]w[redacted]d presented for IOL for cHTN, GDMA2, LGA  S: doing well after epidural   O:  BP 130/80   Pulse 78   Temp 98.1 F (36.7 C) (Oral)   Resp 17   Ht 5\' 6"  (1.676 m)   Wt 110.7 kg   LMP 06/18/2022 (Approximate)   SpO2 98%   BMI 39.38 kg/m  EFM:140bpm/Moderate variability/ 15x15 accels/ Late decels CAT: 1 Toco: regular, every 3 minutes   CVE: Dilation: 5.5 Effacement (%): 50 Cervical Position: Posterior Station: -1 Presentation: Vertex Exam by:: Dr. Alvester Morin   A&P: 36 y.o. [id number] [redacted]w[redacted]d  here for IOL as above  #Labor: Progressing well. AROM blood tinged fluid, mom and baby doing well.  #Pain: Epidural #FWB: CAT 1 #GBS  pending PCR  #CHTN with SIPE: on procardia 30mg  BID, moderate range upon admission otherwise nomotensive, asymptomatic, hx of proteinuria, UPC not done on admission, 231 // 23/15 - required one round of Labetalol for severe range BP after Epidural  - repeat labs UPC 0.73* but around baseline (hx of proteinuria) and down from 2 days ago, 227 // 19/13 - will hold off on magnesium now given stable labs other than elevated UPC in s/o hx of proteinuria and asymptomatic and response to only one dose of labetalol    #Grand multipara #Obesity: BMI 39 #GDMA2: metformin 1g bid and NPHJ 15U am, 5U at bedtime, poorly controlled, @[redacted]w[redacted]d , 3836g, 99% EFW, AC >99%, HC 33%, admission glucose 113 - Q4hr CBGs 113>85>75>86 - shoulder precautions at delivery - low threshold to have TXA at time of delivery     Hessie Dibble, MD FMOB Fellow, Faculty practice [facility], Center for [facility] 04/02/23  5:15 PM

## 2023-04-02 NOTE — Progress Notes (Signed)
LABOR PROGRESS NOTE  Patient Name: Monica Wolfe, female   DOB: 02-Feb-1986, 37 y.o.  MRN: [id number]  [id number] at [redacted]w[redacted]d admitted for IOL for cHTN, A2GDM, LGA  S: Comfortable  O:  BP 137/85   Pulse 89   Temp 98.5 F (36.9 C) (Oral)   Resp 18   Ht 5\' 6"  (1.676 m)   Wt 110.7 kg   LMP 06/18/2022 (Approximate)   SpO2 98%   BMI 39.38 kg/m  EFM:120 bpm/Moderate variability/ 15x15 accels/ Variable and early decels CAT: 2 Toco: regular, every 3 minutes   CVE: Dilation: 9 Effacement (%): 100 Cervical Position: Posterior Station: -1 Presentation: Vertex (verified by ultrasound) Exam by:: Dr. Earlene Plater   A&P:   #Labor: Continuing to progress. Confirmed vertex by BSUS. Babe does feel in LOA position but mildly asynclitic. This babe is additionally projected to be ~700 g larger than previous babes. Continue frequent position changes #Pain: Epidural #FWB: CAT 2 - reassuring variability, accels between ctx #GBS negative #Anticipate vaginal delivery  #cHTN: normal to mild range pressures expect for one severe right after epidural #A2GDM: CBG to goal on current regimen  Joanne Gavel, MD FMOB Fellow, Faculty practice [facility], Center for [facility] 04/02/23  9:01 PM

## 2023-04-02 NOTE — H&P (Signed)
OBSTETRIC ADMISSION HISTORY AND PHYSICAL  Monica Wolfe is a 37 y.o. female [phone] with IUP at [redacted]w[redacted]d by [place] presenting for IOL for CHTN, GDMA2, LGA. She reports +FMs, No LOF, no VB, no blurry vision, headaches or peripheral edema, and RUQ pain.  She plans on breast and bottle feeding. She request ?vasectomy for birth control. She received her prenatal care at [facility]   Dating: By [place] --->  Estimated Date of Delivery: 04/23/23  Sono:    @[redacted]w[redacted]d , CWD, normal anatomy, cephalic presentation, anterior placental lie, 3836g, 99% EFW, AC >99%, HC 33%   Prenatal History/Complications:  Patient Active Problem List   Diagnosis Date Noted   Proteinuria 12/29/2022   Obesity affecting pregnancy 11/26/2022   GDM (gestational diabetes mellitus) 10/31/2022   History of preterm delivery 10/31/2022   Supervision of high risk pregnancy, antepartum 10/31/2022   Chronic hypertension affecting pregnancy 10/31/2022   Grand multipara 10/31/2022   NURSING  PROVIDER  Office Location [facility] Dating by LMP 06/18/2022  [facility] Model Traditional Anatomy U/S WNL except persistent right Umb vein  Initiated care at  Transfer at [employer]  Spanish               LAB RESULTS   Support Person Licensed conveyancer (Husband) Genetics NIPS:  LR NIPT     AFP:  Neg                NT/IT (FT only)        Carrier Screen Horizon: neg 4/4  Rhogam  O/Positive/-- (05/06 1341) A1C/GTT Early:   175-->2 hour abnormal GDM  Third trimester:  Flu Vaccine Next visit       TDaP Vaccine  Next visit  Blood Type O/Positive/-- (05/06 1341)  Covid Vaccine   Antibody Negative (05/06 1341)      Rubella Immune (05/06 0000)  Feeding Plan Breast & Bottle RPR Nonreactive (05/06 0000)  Contraception Vasectomy?  HBsAg Negative (05/06 1341)  Circumcision Girl  HIV Non-reactive (05/06 0000)  Pediatrician  [facility] HCVAb Negative (05/06 1341)  Prenatal Classes            Pap No results found for:  "DIAGPAP"  BTLConsent NA GC/CT Initial: NEG          36wks:  VBAC  Consent NA GBS For PCN allergy, check sensitivities            DME Rx [X]  BP cuff [ ]  Weight Scale Waterbirth  [ ]  Class [ ]  Consent [ ]  CNM visit  PHQ9 & GAD7 [X]  new OB [ x ] 28 weeks  [  ] 36 weeks Induction  [ ]  Orders Entered [ ] Foley Y/N     Past Medical History: Past Medical History:  Diagnosis Date   Bilateral knee pain 07/19/2015   Breast lesion 05/10/2019   Seen at breast center 04/2019     IMPRESSION:  1. No evidence of malignancy in the 3 o'clock position of the left  breast. The focal area of palpable soft tissue thickening  corresponds to normal fibroglandular tissue at ultrasound.  2. 3.2 x 2.4 x 0.5 cm left axillary dermal fluid collection  compatible with a sebaceous cyst (epidermal inclusion cyst) that has  eroded through skin.     RECOMMENDA   Chronic hypertension 05/10/2019   COVID-19 06/18/2019   +COVID-19 06/17/19   Diabetes mellitus without complication (HCC)  Current Diabetic Medications:  None   [ ]  Aspirin 81 mg daily after 12 weeks (= A2/B GDM)     Required Referrals for A1GDM or A2GDM:  [ ]  Diabetes Education and Testing Supplies  [ ]  Nutrition Cousult     For A2/B GDM or higher classes of DM  [ ]  Diabetes Education and Testing Supplies  [ ]  Nutrition Counsult  [ ]  Fetal ECHO after 20 weeks   [ ]  Eye exam for retina evaluation      Baseline and sur   Gestational diabetes mellitus    H/O postpartum hemorrhage, currently pregnant 05/10/2019   With 1st delivery  Had to get blood transfusion  Reports she had a "seizure" due to blood loss  Denies being diagnosed with pre-eclampsia/eclampsia   Kidney stone 10/12/2013   Language barrier 05/10/2019   Spanish translator     Preterm delivery    Preterm labor in third trimester 09/19/2019   Preterm premature rupture of membranes (PPROM) with onset of labor after 24 hours of rupture in third trimester, antepartum 09/07/2019   Recurrent UTI 10/12/2013     Past Surgical History: Past Surgical History:  Procedure Laterality Date   LITHOTRIPSY  10/17/2013    Obstetrical History: OB History     Gravida  8   Para  5   Term  4   Preterm  1   AB  2   Living  5      SAB  2   IAB      Ectopic      Multiple  0   Live Births  5           Social History Social History   Socioeconomic History   Marital status: Married    Spouse name: Not on file   Number of children: Not on file   Years of education: Not on file   Highest education level: Not on file  Occupational History   Not on file  Tobacco Use   Smoking status: Never   Smokeless tobacco: Never  Vaping Use   Vaping status: Never Used  Substance and Sexual Activity   Alcohol use: No   Drug use: No   Sexual activity: Yes  Other Topics Concern   Not on file  Social History Narrative   Not on file   Social Determinants of Health   Financial Resource Strain: Not on file  Food Insecurity: No Food Insecurity (04/02/2023)   Hunger Vital Sign    Worried About Running Out of Food in the Last Year: Never true    Ran Out of Food in the Last Year: Never true  Transportation Needs: No Transportation Needs (04/02/2023)   PRAPARE - Administrator, Civil Service (Medical): No    Lack of Transportation (Non-Medical): No  Physical Activity: Not on file  Stress: Not on file  Social Connections: Not on file    Family History: Family History  Problem Relation Age of Onset   Hypertension Mother    CAD Father    Hypertension Maternal Grandmother    Diabetes Maternal Grandmother    Asthma Neg Hx    Cancer Neg Hx    Heart disease Neg Hx     Allergies: No Known Allergies  Medications Prior to Admission  Medication Sig Dispense Refill Last Dose   ASPIRIN 81 PO Take by mouth.      insulin NPH Human (NOVOLIN N) 100 UNIT/ML injection Inject 0.1 mLs (10 Units total)  into the skin daily before breakfast. (Patient taking differently: Inject 15  Units into the skin daily before breakfast. 5 UNITS AT BEDTIME) 10 mL 2    metFORMIN (GLUCOPHAGE) 500 MG tablet Take 2 tablets (1,000 mg total) by mouth 2 (two) times daily with a meal. (Patient not taking: Reported on 03/11/2023) 120 tablet 5    NIFEdipine (PROCARDIA-XL/NIFEDICAL-XL) 30 MG 24 hr tablet Take 1 tablet (30 mg total) by mouth in the morning and at bedtime. 60 tablet 5    Prenatal Vit-Fe Fumarate-FA (PRENATAL VITAMINS) 28-0.8 MG TABS Take 1 tablet by mouth daily. 30 tablet 6      Review of Systems   All systems reviewed and negative except as stated in HPI  Blood pressure 128/87, pulse 78, temperature 98 F (36.7 C), temperature source Oral, resp. rate 18, height 5\' 6"  (1.676 m), weight 110.7 kg, last menstrual period 06/18/2022, unknown if currently breastfeeding. General appearance: alert, cooperative, and appears stated age Lungs: clear to auscultation bilaterally Heart: regular rate and rhythm Abdomen: soft, non-tender; bowel sounds normal Pelvic: normal female genitalia  Extremities: Homans sign is negative, no sign of DVT Presentation: cephalic Fetal monitoringBaseline: 140 bpm, Variability: Fair (1-6 bpm), Accelerations: Reactive, and Decelerations: Variable: mild Uterine activity q5-68mins Dilation: 3.5 Effacement (%): 50 Station: -2 Exam by:: [employer] RN and Roma Schanz, RN   Prenatal labs: ABO, Rh: --/--/O POS (10/24 0645) Antibody: NEG (10/24 0645) Rubella: Immune (05/06 0000) RPR: Nonreactive (05/06 0000)  HBsAg: Negative (05/06 1341)  HIV: Non-reactive (05/06 0000)  GBS:    2 hr Glucola abnormal, GDM Genetic screening  LR NIPT, AFP neg, Horizon neg 4/4 Anatomy US persistent R umbilical vein, otherwise normal   Prenatal Transfer Tool  Maternal Diabetes: No Genetic Screening: Normal Maternal Ultrasounds/Referrals: Other: persistent R umbilical vein  Fetal Ultrasounds or other Referrals:  Fetal echo, Referred to Materal Fetal Medicine   Maternal Substance Abuse:  No Significant Maternal Medications:  Meds include: Other:  Metformin, Insulin, Procardia  Significant Maternal Lab Results:  Other:  unknown GBS this pregnancy, no hx of GBS previously or newborn affected by GBS infection Number of Prenatal Visits:greater than 3 verified prenatal visits Other Comments:  None  Results for orders placed or performed during the hospital encounter of 04/02/23 (from the past 24 hour(s))  Type and screen   Collection Time: 04/02/23  6:45 AM  Result Value Ref Range   ABO/RH(D) O POS    Antibody Screen NEG    Sample Expiration      04/05/2023,2359 Performed at [facility], [address] [place], [place] [address]   CBC   Collection Time: 04/02/23  7:30 AM  Result Value Ref Range   WBC 10.1 4.0 - 10.5 K/uL   RBC 4.10 3.87 - 5.11 MIL/uL   Hemoglobin 12.4 12.0 - 15.0 g/dL   HCT 47.8 29.5 - 62.1 %   MCV 91.0 80.0 - 100.0 fL   MCH 30.2 26.0 - 34.0 pg   MCHC 33.2 30.0 - 36.0 g/dL   RDW 30.8 65.7 - 84.6 %   Platelets 231 150 - 400 K/uL   nRBC 0.0 0.0 - 0.2 %  Comprehensive metabolic panel   Collection Time: 04/02/23  7:30 AM  Result Value Ref Range   Sodium 132 (L) 135 - 145 mmol/L   Potassium 3.9 3.5 - 5.1 mmol/L   Chloride 102 98 - 111 mmol/L   CO2 21 (L) 22 - 32 mmol/L   Glucose, Bld 113 (  H) 70 - 99 mg/dL   BUN 6 6 - 20 mg/dL   Creatinine, Ser 1.30 0.44 - 1.00 mg/dL   Calcium 8.4 (L) 8.9 - 10.3 mg/dL   Total Protein 6.7 6.5 - 8.1 g/dL   Albumin 2.7 (L) 3.5 - 5.0 g/dL   AST 23 15 - 41 U/L   ALT 15 0 - 44 U/L   Alkaline Phosphatase 95 38 - 126 U/L   Total Bilirubin 0.6 0.3 - 1.2 mg/dL   GFR, Estimated >86 >57 mL/min   Anion gap 9 5 - 15    Patient Active Problem List   Diagnosis Date Noted   Proteinuria 12/29/2022   Obesity affecting pregnancy 11/26/2022   GDM (gestational diabetes mellitus) 10/31/2022   History of preterm delivery 10/31/2022   Supervision of high risk pregnancy, antepartum 10/31/2022    Chronic hypertension affecting pregnancy 10/31/2022   Grand multipara 10/31/2022    Assessment/Plan:  Jaira Beas is a 37 y.o. [id number] at [redacted]w[redacted]d here for IOL CHTN, GDMA2 (EFW 99%, AC >99%)  #Labor:  Start with pitocin. Consider AROM at next check  #Pain: Per patient request #FWB: Cat 2 #ID:  GBS pcr pending  #MOF: both #MOC:?vesectomy   #CHTN: on procardia 30mg  BID, moderate range upon admission otherwise nomotensive, asymptomatic, hx of proteinuria - admission labs pending  #Grand multipara #Obesity: BMI 39 #GDMA2: metformin 1g bid and NPHJ 15U am, 5U at bedtime, poorly controlled, @[redacted]w[redacted]d , 3836g, 99% EFW, AC >99%, HC 33%, admission glucose 113 - Q4hr CBGs - shoulder precautions at delivery - low threshold to have TXA at time of delivery    Hessie Dibble, MD  04/02/2023, 8:48 AM

## 2023-04-02 NOTE — Progress Notes (Signed)
Called to room to assist with AROM.   Anterior placed cervix Dilation: 5.5 Effacement (%): 50 Cervical Position: Posterior Station: -1 Presentation: Vertex Exam by:: Dr. Alvester Morin   AROM performed successfully with blood tinged fluid that cleared.   Federico Flake, MD

## 2023-04-03 LAB — CBC
HCT: 33.2 % — ABNORMAL LOW (ref 36.0–46.0)
HCT: 33.2 % — ABNORMAL LOW (ref 36.0–46.0)
Hemoglobin: 11.2 g/dL — ABNORMAL LOW (ref 12.0–15.0)
Hemoglobin: 11.1 g/dL — ABNORMAL LOW (ref 12.0–15.0)
MCH: 31 pg (ref 26.0–34.0)
MCH: 30.7 pg (ref 26.0–34.0)
MCHC: 33.7 g/dL (ref 30.0–36.0)
MCHC: 33.4 g/dL (ref 30.0–36.0)
MCV: 92 fL (ref 80.0–100.0)
MCV: 92 fL (ref 80.0–100.0)
Platelets: 209 10*3/uL (ref 150–400)
Platelets: 231 10*3/uL (ref 150–400)
RBC: 3.61 MIL/uL — ABNORMAL LOW (ref 3.87–5.11)
RBC: 3.61 MIL/uL — ABNORMAL LOW (ref 3.87–5.11)
RDW: 13.5 % (ref 11.5–15.5)
RDW: 13.4 % (ref 11.5–15.5)
WBC: 16.4 10*3/uL — ABNORMAL HIGH (ref 4.0–10.5)
WBC: 14 10*3/uL — ABNORMAL HIGH (ref 4.0–10.5)
nRBC: 0 % (ref 0.0–0.2)
nRBC: 0 % (ref 0.0–0.2)

## 2023-04-03 LAB — GLUCOSE, CAPILLARY: Glucose-Capillary: 190 mg/dL — ABNORMAL HIGH (ref 70–99)

## 2023-04-03 LAB — URINE CULTURE, OB REFLEX

## 2023-04-03 LAB — CULTURE, OB URINE

## 2023-04-03 MED ORDER — ONDANSETRON HCL 4 MG PO TABS: 4.0000 mg | ORAL_TABLET | ORAL | Status: DC | PRN

## 2023-04-03 MED ORDER — METHYLERGONOVINE MALEATE 0.2 MG PO TABS
0.2000 mg | ORAL_TABLET | Freq: Four times a day (QID) | ORAL | Status: AC
Start: 2023-04-03 — End: 2023-04-03
  Administered 2023-04-03 (×2): 0.2 mg via ORAL
  Filled 2023-04-03 (×2): qty 1

## 2023-04-03 MED ORDER — BENZOCAINE-MENTHOL 20-0.5 % EX AERO: 1.0000 | INHALATION_SPRAY | CUTANEOUS | Status: DC | PRN

## 2023-04-03 MED ORDER — ONDANSETRON HCL 4 MG/2ML IJ SOLN: 4.0000 mg | INTRAMUSCULAR | Status: DC | PRN

## 2023-04-03 MED ORDER — ZOLPIDEM TARTRATE 5 MG PO TABS: 5.0000 mg | ORAL_TABLET | Freq: Every evening | ORAL | Status: DC | PRN

## 2023-04-03 MED ORDER — COCONUT OIL OIL: 1.0000 | TOPICAL_OIL | Status: DC | PRN

## 2023-04-03 MED ORDER — OXYCODONE HCL 5 MG PO TABS: 10.0000 mg | ORAL_TABLET | ORAL | Status: DC | PRN

## 2023-04-03 MED ORDER — IBUPROFEN 800 MG PO TABS
800.0000 mg | ORAL_TABLET | Freq: Three times a day (TID) | ORAL | Status: DC
  Administered 2023-04-03 – 2023-04-04 (×6): 800 mg via ORAL
  Filled 2023-04-03 (×6): qty 1

## 2023-04-03 MED ORDER — FUROSEMIDE 20 MG PO TABS
40.0000 mg | ORAL_TABLET | Freq: Every day | ORAL | Status: DC
  Administered 2023-04-03 – 2023-04-04 (×2): 40 mg via ORAL
  Filled 2023-04-03 (×2): qty 2

## 2023-04-03 MED ORDER — OXYCODONE HCL 5 MG PO TABS: 5.0000 mg | ORAL_TABLET | ORAL | Status: DC | PRN

## 2023-04-03 MED ORDER — DIPHENHYDRAMINE HCL 25 MG PO CAPS: 25.0000 mg | ORAL_CAPSULE | Freq: Four times a day (QID) | ORAL | Status: DC | PRN

## 2023-04-03 MED ORDER — WITCH HAZEL-GLYCERIN EX PADS: 1.0000 | MEDICATED_PAD | CUTANEOUS | Status: DC | PRN

## 2023-04-03 MED ORDER — PRENATAL MULTIVITAMIN CH
1.0000 | ORAL_TABLET | Freq: Every day | ORAL | Status: DC
  Administered 2023-04-03 – 2023-04-04 (×2): 1 via ORAL
  Filled 2023-04-03 (×2): qty 1

## 2023-04-03 MED ORDER — ACETAMINOPHEN 500 MG PO TABS
1000.0000 mg | ORAL_TABLET | Freq: Three times a day (TID) | ORAL | Status: DC
  Administered 2023-04-03 – 2023-04-04 (×5): 1000 mg via ORAL
  Filled 2023-04-03 (×6): qty 2

## 2023-04-03 MED ORDER — SENNOSIDES-DOCUSATE SODIUM 8.6-50 MG PO TABS
2.0000 | ORAL_TABLET | Freq: Every day | ORAL | Status: DC
Start: 2023-04-04 — End: 2023-04-04
  Administered 2023-04-04: 2 via ORAL
  Filled 2023-04-03: qty 2

## 2023-04-03 MED ORDER — SIMETHICONE 80 MG PO CHEW: 80.0000 mg | CHEWABLE_TABLET | ORAL | Status: DC | PRN

## 2023-04-03 MED ORDER — DIBUCAINE (PERIANAL) 1 % EX OINT: 1.0000 | TOPICAL_OINTMENT | CUTANEOUS | Status: DC | PRN

## 2023-04-03 NOTE — Lactation Note (Signed)
This note was copied from a Monica's chart. Lactation Consultation Note  Patient Name: Monica Wolfe [id number] Date: 04/03/2023 Age:35 hours Reason for consult: Initial assessment;Early term 37-38.6wks;Maternal endocrine disorder;Other (Comment) (AMA, grand multipara, HTN)  Visited with family of 67 hours old ETI female; Monica Wolfe is a P6 but not very experienced breastfeeding. She reported a Hx of low supply with her other babies, she also denied any engorgement with them, even with the first 4 when she didn't breastfeed. Offered assistance with latch but Monica "Celina" has already fed, she had 30 ml of Similac 20 calorie formula. Inquired about pumping and she voiced she would like to give it a try to see if it helps with her supply. WIC referral sent to [facility] since she doesn't have a pump for home use. She initiated pumping during [facility] consult, praised her for her efforts. Reviewed normal ETI behavior, feeding cues, cluster feeding, lactogenesis II, pumping schedule, size of Monica's stomach, supplementation and anticipatory guidelines.  Maternal Data Has patient been taught Hand Expression?: Yes Does the patient have breastfeeding experience prior to this delivery?: Yes How long did the patient breastfeed?: Only breastfed 5th Monica for 3 weeks Hx of benign mass on L side @ 3 pm, no surgery since it was reported as "fibroglandular tissue"  Feeding Mother's Current Feeding Choice: Breast Milk and Formula Nipple Type: Slow - flow  Lactation Tools Discussed/Used Tools: Pump;Flanges Flange Size: 24 Breast pump type: Double-Electric Breast Pump Pump Education: Setup, frequency, and cleaning;Milk Storage Reason for Pumping: PPH of 1724 cc, ETI Pumping frequency: q 3 hours (recommended)  Interventions Interventions: Breast feeding basics reviewed;DEBP;Education;CDC Guidelines for Breast Pump Cleaning;LC Services brochure  Plan Encouraged to put Monica to breast +8  times/24 hours or sooner if feeding cues are present She'll pump after feedings/attempts at the breast or whenever Monica is getting a bottle Parents will continue supplementing with formula per feeding choice on admission, following supplementation guidelines according to Monica's age in hours  FOB present. All questions and concerns answered, family to contact [facility] services PRN.  Discharge Discharge Education: Engorgement and breast care WIC Program: Yes [place]  Consult Status Consult Status: Follow-up Date: 04/04/23 Follow-up type: In-patient   Rashell Shambaugh Venetia Constable 04/03/2023, 6:16 PM

## 2023-04-03 NOTE — Progress Notes (Signed)
POSTPARTUM PROGRESS NOTE  Subjective: Monica Wolfe is a 37 y.o. [id number] s/p SVD at 108w0d.  She reports she is doing well. No acute events overnight after transfer to postpartum floor. She denies any problems with PO intake. Has mobilized from wheelchair to bed. Foley still in. Denies nausea or vomiting. She has passed flatus. Pain is well controlled.  Lochia is decreasing.  Objective: Blood pressure 133/77, pulse 100, temperature 98.7 F (37.1 C), temperature source Oral, resp. rate 18, height 5\' 6"  (1.676 m), weight 110.7 kg, last menstrual period 06/18/2022, SpO2 100%, unknown if currently breastfeeding.  Physical Exam:  General: alert, cooperative and no distress Chest: no respiratory distress Abdomen: soft, non-tender  Uterine Fundus: firm and at level of umbilicus Extremities: No calf swelling or tenderness  Trace edema  Recent Labs    04/02/23 1629 04/03/23 0116  HGB 12.2 11.2*  HCT 37.3 33.2*    Assessment/Plan: Monica Wolfe is a 37 y.o. [id number] s/p SVD at [redacted]w[redacted]d for cHTN, A2GDM.  Routine Postpartum Care: Doing well, pain well-controlled.  -- Continue routine care, lactation support  -- Contraception: unsure -- Feeding: breast  -- PPH: bleeding now light and fundus firm. Remains HDS. Can remove foley after ambulates. Repeat CBC today at noon -- cHTN: Continue procardia XL 30 mg every day. Lasix x7 days -- A2GDM: Fasting this AM elevated as patient ate cook out at 3 AM. Plan for repeat tomorrow AM  Dispo: Plan for discharge 10/26.  Monica Gavel, MD OB Fellow 04/03/2023 6:30 AM

## 2023-04-03 NOTE — Progress Notes (Signed)
LABOR PROGRESS NOTE  Patient Name: Monica Wolfe, female   DOB: 14-Feb-1986, 36 y.o.  MRN: [id number]  Patient's Jada removed from suction around 0030 by Dr. Despina Hidden. I reassessed and found no active bleeding and ~100 cc in cannister and tubing. Balloon deflated, and Jada removed without issue. Fundus remains firm. CBC pending. Mom can be transferred to postpartum.  Joanne Gavel, MD FMOB Fellow, Faculty practice [facility], Center for [facility] 04/03/23  1:34 AM

## 2023-04-03 NOTE — Anesthesia Postprocedure Evaluation (Signed)
Anesthesia Post Note  Patient: Monica Wolfe  Procedure(s) Performed: AN AD HOC LABOR EPIDURAL     Patient location during evaluation: Mother Baby Anesthesia Type: Epidural Level of consciousness: awake, oriented and awake and alert Pain management: pain level controlled Vital Signs Assessment: post-procedure vital signs reviewed and stable Respiratory status: spontaneous breathing, respiratory function stable and nonlabored ventilation Cardiovascular status: stable Postop Assessment: no headache, adequate PO intake, able to ambulate, patient able to bend at knees and no apparent nausea or vomiting Anesthetic complications: no   No notable events documented.  Last Vitals:  Vitals:   04/03/23 0335 04/03/23 0730  BP: 133/77 116/80  Pulse: 100 84  Resp: 18 18  Temp: 37.1 C 36.7 C  SpO2: 100%     Last Pain:  Vitals:   04/03/23 0730  TempSrc: Oral  PainSc: 0-No pain   Pain Goal:                   Kristl Morioka

## 2023-04-04 ENCOUNTER — Other Ambulatory Visit (HOSPITAL_COMMUNITY): Payer: Self-pay

## 2023-04-04 LAB — CULTURE, BETA STREP (GROUP B ONLY): Strep Gp B Culture: NEGATIVE

## 2023-04-04 LAB — GLUCOSE, CAPILLARY: Glucose-Capillary: 122 mg/dL — ABNORMAL HIGH (ref 70–99)

## 2023-04-04 MED ORDER — ACETAMINOPHEN 500 MG PO TABS
1000.0000 mg | ORAL_TABLET | Freq: Three times a day (TID) | ORAL | 0 refills | Status: DC
  Filled 2023-04-04: qty 30, 5d supply, fill #0

## 2023-04-04 MED ORDER — SENNOSIDES-DOCUSATE SODIUM 8.6-50 MG PO TABS
2.0000 | ORAL_TABLET | Freq: Every day | ORAL | 0 refills | Status: DC
  Filled 2023-04-04: qty 60, 30d supply, fill #0

## 2023-04-04 MED ORDER — MEDROXYPROGESTERONE ACETATE 150 MG/ML IM SUSP: 150.0000 mg | Freq: Once | INTRAMUSCULAR | Status: DC

## 2023-04-04 MED ORDER — FUROSEMIDE 40 MG PO TABS
40.0000 mg | ORAL_TABLET | Freq: Every day | ORAL | 0 refills | Status: DC
  Filled 2023-04-04: qty 5, 5d supply, fill #0

## 2023-04-04 MED ORDER — IBUPROFEN 800 MG PO TABS
800.0000 mg | ORAL_TABLET | Freq: Three times a day (TID) | ORAL | 0 refills | Status: DC
  Filled 2023-04-04: qty 30, 10d supply, fill #0

## 2023-04-04 NOTE — Progress Notes (Signed)
The MOB is very tearful and upset due to maternal exhaustion. The baby has been extremely fussy and is very upset about baby crying all night.

## 2023-04-07 ENCOUNTER — Encounter: Payer: Self-pay | Admitting: Family Medicine

## 2023-04-08 LAB — BIRTH TISSUE RECOVERY COLLECTION (PLACENTA DONATION)

## 2023-04-14 ENCOUNTER — Encounter: Payer: Self-pay | Admitting: Family Medicine

## 2023-04-21 ENCOUNTER — Telehealth (HOSPITAL_COMMUNITY): Payer: Self-pay | Admitting: *Deleted

## 2023-04-21 NOTE — Telephone Encounter (Signed)
04/21/2023  Name: Monica Wolfe MRN: [id number] DOB: 1986/05/12  Reason for Call:  Transition of Care Hospital Discharge Call  Contact Status: Patient Contact Status: Message  Language assistant needed: Interpreter Mode: Telephonic Interpreter Interpreter Name: Caron Presume [phone]        Follow-Up Questions:    Inocente Salles Postnatal Depression Scale:  In the Past 7 Days:    PHQ2-9 Depression Scale:     Discharge Follow-up:    Post-discharge interventions: NA  Salena Saner, RN 04/21/2023 15:50

## 2023-04-22 ENCOUNTER — Encounter: Payer: Self-pay | Admitting: Obstetrics and Gynecology

## 2023-04-29 ENCOUNTER — Encounter: Payer: Self-pay | Admitting: General Practice

## 2023-06-24 ENCOUNTER — Emergency Department (HOSPITAL_COMMUNITY)
Admission: EM | Admit: 2023-06-24 | Discharge: 2023-06-24 | Disposition: A | Discharge status: Home / Self Care | Payer: Self-pay | Attending: Emergency Medicine | Admitting: Emergency Medicine

## 2023-06-24 ENCOUNTER — Emergency Department (HOSPITAL_COMMUNITY): Payer: Self-pay

## 2023-06-24 ENCOUNTER — Other Ambulatory Visit: Payer: Self-pay

## 2023-06-24 ENCOUNTER — Encounter (HOSPITAL_COMMUNITY): Payer: Self-pay | Admitting: Emergency Medicine

## 2023-06-24 DIAGNOSIS — R300 Dysuria: Secondary | ICD-10-CM | POA: Insufficient documentation

## 2023-06-24 DIAGNOSIS — I10 Essential (primary) hypertension: Secondary | ICD-10-CM | POA: Insufficient documentation

## 2023-06-24 DIAGNOSIS — R109 Unspecified abdominal pain: Secondary | ICD-10-CM | POA: Insufficient documentation

## 2023-06-24 DIAGNOSIS — Z79899 Other long term (current) drug therapy: Secondary | ICD-10-CM | POA: Insufficient documentation

## 2023-06-24 DIAGNOSIS — R112 Nausea with vomiting, unspecified: Secondary | ICD-10-CM | POA: Insufficient documentation

## 2023-06-24 LAB — BASIC METABOLIC PANEL
Anion gap: 9 (ref 5–15)
BUN: 6 mg/dL (ref 6–20)
CO2: 22 mmol/L (ref 22–32)
Calcium: 9.1 mg/dL (ref 8.9–10.3)
Chloride: 103 mmol/L (ref 98–111)
Creatinine, Ser: 0.56 mg/dL (ref 0.44–1.00)
GFR, Estimated: >60 mL/min (ref >60)
Glucose, Bld: 84 mg/dL (ref 70–99)
Potassium: 3.9 mmol/L (ref 3.5–5.1)
Sodium: 134 mmol/L — ABNORMAL LOW (ref 135–145)

## 2023-06-24 LAB — CBC
HCT: 38.1 % (ref 36.0–46.0)
Hemoglobin: 11.8 g/dL — ABNORMAL LOW (ref 12.0–15.0)
MCH: 23.8 pg — ABNORMAL LOW (ref 26.0–34.0)
MCHC: 31 g/dL (ref 30.0–36.0)
MCV: 77 fL — ABNORMAL LOW (ref 80.0–100.0)
Platelets: 343 10*3/uL (ref 150–400)
RBC: 4.95 MIL/uL (ref 3.87–5.11)
RDW: 16.2 % — ABNORMAL HIGH (ref 11.5–15.5)
WBC: 6.9 10*3/uL (ref 4.0–10.5)
nRBC: 0 % (ref 0.0–0.2)

## 2023-06-24 LAB — I-STAT CHEM 8, ED
BUN: 8 mg/dL (ref 6–20)
Calcium, Ion: 1.13 mmol/L — ABNORMAL LOW (ref 1.15–1.40)
Chloride: 103 mmol/L (ref 98–111)
Creatinine, Ser: 0.6 mg/dL (ref 0.44–1.00)
Glucose, Bld: 84 mg/dL (ref 70–99)
HCT: 38 % (ref 36.0–46.0)
Hemoglobin: 12.9 g/dL (ref 12.0–15.0)
Potassium: 4.1 mmol/L (ref 3.5–5.1)
Sodium: 137 mmol/L (ref 135–145)
TCO2: 24 mmol/L (ref 22–32)

## 2023-06-24 LAB — URINALYSIS, W/ REFLEX TO CULTURE (INFECTION SUSPECTED)
Bilirubin Urine: NEGATIVE
Bilirubin Urine: NEGATIVE
Glucose, UA: NEGATIVE mg/dL
Glucose, UA: NEGATIVE mg/dL
Ketones, ur: NEGATIVE mg/dL
Ketones, ur: NEGATIVE mg/dL
Nitrite: NEGATIVE
Nitrite: NEGATIVE
Protein, ur: 100 mg/dL — AB
Protein, ur: 100 mg/dL — AB
RBC / HPF: >50 RBC/hpf (ref 0–5)
RBC / HPF: >50 RBC/hpf (ref 0–5)
Specific Gravity, Urine: 1.017 (ref 1.005–1.030)
Specific Gravity, Urine: 1.019 (ref 1.005–1.030)
pH: 5 (ref 5.0–8.0)
pH: 6 (ref 5.0–8.0)

## 2023-06-24 LAB — LIPASE, BLOOD: Lipase: 28 U/L (ref 11–51)

## 2023-06-24 LAB — HCG, SERUM, QUALITATIVE: Preg, Serum: NEGATIVE

## 2023-06-24 MED ORDER — IBUPROFEN 400 MG PO TABS
400.0000 mg | ORAL_TABLET | Freq: Once | ORAL | Status: AC | PRN
  Administered 2023-06-24: 400 mg via ORAL
  Filled 2023-06-24: qty 1

## 2023-06-24 MED ORDER — CYCLOBENZAPRINE HCL 10 MG PO TABS: 10.0000 mg | ORAL_TABLET | Freq: Two times a day (BID) | ORAL | 0 refills | Status: DC | PRN

## 2023-06-24 MED ORDER — ONDANSETRON 4 MG PO TBDP
4.0000 mg | ORAL_TABLET | Freq: Once | ORAL | Status: AC
  Administered 2023-06-24: 4 mg via ORAL
  Filled 2023-06-24: qty 1

## 2023-06-24 MED ORDER — OXYCODONE-ACETAMINOPHEN 5-325 MG PO TABS
1.0000 | ORAL_TABLET | ORAL | Status: DC | PRN
  Administered 2023-06-24: 1 via ORAL
  Filled 2023-06-24: qty 1

## 2023-06-24 NOTE — ED Triage Notes (Signed)
 Pt reports 2 day hx of left flank pain and dysuria. Denies recent fever. States today the pain was worse. Pt reports irregular periods, recent delivery Oct 24. VSS.

## 2023-06-24 NOTE — ED Provider Notes (Signed)
 [facility] Provider Note   CSN: [id number] Arrival date & time: 06/24/23  1237     History  Chief Complaint  Patient presents with   Flank Pain   Dysuria    Monica Wolfe is a 38 y.o. female.  The history is provided by the patient.  Flank Pain  Dysuria Associated symptoms: flank pain   Monica Wolfe is a 38 y.o. female who presents to the Emergency Department complaining of flank pain.  She presents to the emergency department for evaluation of 2 days of left-sided flank pain that radiates to the left upper quadrant.  Has associated nausea with 1 episode of emesis.  No fevers, dysuria.  Currently taking nifedipine  for hypertension.  She is currently breast-feeding a 13-month-old.  No prior similar symptoms.  No reported injuries.  Pain is worse with movement.     Home Medications Prior to Admission medications   Medication Sig Start Date End Date Taking? Authorizing Provider  cyclobenzaprine  (FLEXERIL ) 10 MG tablet Take 1 tablet (10 mg total) by mouth 2 (two) times daily as needed for muscle spasms. 06/24/23  Yes Kelsey Patricia, MD  acetaminophen  (TYLENOL ) 500 MG tablet Take 2 tablets (1,000 mg total) by mouth every 8 (eight) hours. 04/04/23   Maud Sorenson, MD  furosemide  (LASIX ) 40 MG tablet Take 1 tablet (40 mg total) by mouth daily. 04/04/23   Maud Sorenson, MD  ibuprofen  (ADVIL ) 800 MG tablet Take 1 tablet (800 mg total) by mouth 3 (three) times daily. 04/04/23   Maud Sorenson, MD  NIFEdipine  (PROCARDIA -XL/NIFEDICAL-XL) 30 MG 24 hr tablet Take 1 tablet (30 mg total) by mouth in the morning and at bedtime. 03/31/23   Teena Feast, MD  Prenatal Vit-Fe Fumarate-FA (PRENATAL VITAMINS) 28-0.8 MG TABS Take 1 tablet by mouth daily. 08/05/19   Tresia Fruit, MD  senna-docusate (SENOKOT-S) 8.6-50 MG tablet Take 2 tablets by mouth daily. 04/04/23   Maud Sorenson, MD      Allergies     Patient has no known allergies.    Review of Systems   Review of Systems  Genitourinary:  Positive for dysuria and flank pain.  All other systems reviewed and are negative.   Physical Exam Updated Vital Signs BP (!) 153/101 (BP Location: Left Arm)   Pulse 74   Temp 98.3 F (36.8 C)   Resp 18   Ht 5\' 6"  (1.676 m)   Wt 97 kg   LMP 06/18/2022 (Approximate)   SpO2 100%   BMI 34.52 kg/m  Physical Exam Vitals and nursing note reviewed.  Constitutional:      Appearance: She is well-developed.  HENT:     Head: Normocephalic and atraumatic.  Cardiovascular:     Rate and Rhythm: Normal rate and regular rhythm.     Heart sounds: No murmur heard. Pulmonary:     Effort: Pulmonary effort is normal. No respiratory distress.     Breath sounds: Normal breath sounds.  Abdominal:     Palpations: Abdomen is soft.     Tenderness: There is no abdominal tenderness. There is no guarding or rebound.  Musculoskeletal:        General: No tenderness.  Skin:    General: Skin is warm and dry.  Neurological:     Mental Status: She is alert and oriented to person, place, and time.     Comments: 5 out of 5 strength in all 4 extremities with  sensation to light touch intact in all 4 extremities  Psychiatric:        Behavior: Behavior normal.     ED Results / Procedures / Treatments   Labs (all labs ordered are listed, but only abnormal results are displayed) Labs Reviewed  CBC - Abnormal; Notable for the following components:      Result Value   Hemoglobin 11.8 (*)    MCV 77.0 (*)    MCH 23.8 (*)    RDW 16.2 (*)    All other components within normal limits  BASIC METABOLIC PANEL - Abnormal; Notable for the following components:   Sodium 134 (*)    All other components within normal limits  URINALYSIS, W/ REFLEX TO CULTURE (INFECTION SUSPECTED) - Abnormal; Notable for the following components:   APPearance CLOUDY (*)    Hgb urine dipstick MODERATE (*)    Protein, ur 100 (*)     Leukocytes,Ua MODERATE (*)    Bacteria, UA FEW (*)    All other components within normal limits  URINALYSIS, W/ REFLEX TO CULTURE (INFECTION SUSPECTED) - Abnormal; Notable for the following components:   APPearance CLOUDY (*)    Hgb urine dipstick MODERATE (*)    Protein, ur 100 (*)    Leukocytes,Ua SMALL (*)    Bacteria, UA RARE (*)    All other components within normal limits  I-STAT CHEM 8, ED - Abnormal; Notable for the following components:   Calcium, Ion 1.13 (*)    All other components within normal limits  LIPASE, BLOOD  HCG, SERUM, QUALITATIVE    EKG None  Radiology CT Renal Stone Study Result Date: 06/24/2023 CLINICAL DATA:  Acute left flank pain. EXAM: CT ABDOMEN AND PELVIS WITHOUT CONTRAST TECHNIQUE: Multidetector CT imaging of the abdomen and pelvis was performed following the standard protocol without IV contrast. RADIATION DOSE REDUCTION: This exam was performed according to the departmental dose-optimization program which includes automated exposure control, adjustment of the mA and/or kV according to patient size and/or use of iterative reconstruction technique. COMPARISON:  September 14, 2013. FINDINGS: Lower chest: No acute abnormality. Hepatobiliary: Hepatic steatosis. No cholelithiasis or biliary dilatation. 14 mm echogenic focus seen in right hepatic lobe inferiorly. Two similar but smaller abnormalities are noted in posterior segment of right hepatic lobe, each measuring 12-15 mm. Pancreas: Unremarkable. No pancreatic ductal dilatation or surrounding inflammatory changes. Spleen: Normal in size without focal abnormality. Adrenals/Urinary Tract: Adrenal glands appear normal. 16 mm nonobstructive calculus seen in left renal pelvis. No hydronephrosis or renal obstruction is noted. Urinary bladder is unremarkable. Stomach/Bowel: Stomach is within normal limits. Appendix appears normal. No evidence of bowel wall thickening, distention, or inflammatory changes. Vascular/Lymphatic:  No significant vascular findings are present. No enlarged abdominal or pelvic lymph nodes. Reproductive: Uterus and bilateral adnexa are unremarkable. Other: No abdominal wall hernia or abnormality. No abdominopelvic ascites. Musculoskeletal: No acute or significant osseous findings. IMPRESSION: Hepatic steatosis. Three rounded echogenic foci are noted in right hepatic lobe which may represent hemangiomas, but other pathology cannot be excluded. When the patient is clinically stable and able to follow directions and hold their breath (preferably as an outpatient) further evaluation with dedicated abdominal MRI should be considered. 16 mm nonobstructive calculus seen in left renal pelvis. Electronically Signed   By: Rosalene Colon M.D.   On: 06/24/2023 17:50    Procedures Procedures    Medications Ordered in ED Medications  oxyCODONE -acetaminophen  (PERCOCET/ROXICET) 5-325 MG per tablet 1 tablet (1 tablet Oral Given 06/24/23 1323)  ibuprofen  (ADVIL ) tablet 400 mg (400 mg Oral Given 06/24/23 1324)  ondansetron  (ZOFRAN -ODT) disintegrating tablet 4 mg (4 mg Oral Given 06/24/23 1329)    ED Course/ Medical Decision Making/ A&P                                 Medical Decision Making Amount and/or Complexity of Data Reviewed Labs: ordered.  Risk Prescription drug management.   Patient here for evaluation of left flank pain for the last few days.  She is neurologically intact on evaluation.  CT scan demonstrates no evidence of obstructing stone.  Discussed with patient incidental findings of liver lesions, nonobstructing left renal stone.  UA has numerous epithelials present, not consistent with UTI.  Will check a repeat urinalysis and send for culture given her flank symptoms.  Suspect overall her symptoms are musculoskeletal in nature.  BMP is unremarkable, CBC with mild anemia.  Discussed with patient home care for flank pain with outpatient follow-up and return precautions.        Final  Clinical Impression(s) / ED Diagnoses Final diagnoses:  Left flank pain    Rx / DC Orders ED Discharge Orders          Ordered    cyclobenzaprine  (FLEXERIL ) 10 MG tablet  2 times daily PRN        06/24/23 2335              Kelsey Patricia, MD 06/25/23 0004

## 2023-06-24 NOTE — ED Provider Triage Note (Signed)
Emergency Medicine Provider Triage Evaluation Note  Monica Wolfe , a 38 y.o. female  was evaluated in triage.  Pt complains of left-sided flank pain nausea vomiting intermittent x 2 days history of kidney stones radiates from left flank into the left pelvis.  No urinary symptoms.  1 episode of vomiting this morning..  Review of Systems  Positive: Flank pain and urinary pressure Negative: Fever  Physical Exam  BP (!) 151/120 (BP Location: Right Arm)   Pulse 90   Temp 98.2 F (36.8 C)   Resp 16   Ht 5\' 6"  (1.676 m)   Wt 97 kg   LMP 06/18/2022 (Approximate)   SpO2 100%   BMI 34.52 kg/m  Gen:   Awake, no distress  Resp:  Normal effort  MSK:   Moves extremities without difficulty  Other:  Left CVA tenderness  Medical Decision Making  Medically screening exam initiated at 1:23 PM.  Appropriate orders placed.  Monica Wolfe was informed that the remainder of the evaluation will be completed by another provider, this initial triage assessment does not replace that evaluation, and the importance of remaining in the ED until their evaluation is complete.     Arthor Captain, PA-C 06/24/23 1325

## 2023-06-24 NOTE — Discharge Instructions (Signed)
 You have spots on your liver that we will need to be further evaluated by your family doctor.  You have a 16 mm stone in your left kidney.  This is not currently causing a problem.  Please follow-up with your family doctor for recheck.

## 2023-07-21 ENCOUNTER — Encounter: Payer: Self-pay | Admitting: Certified Nurse Midwife

## 2023-07-21 ENCOUNTER — Ambulatory Visit (INDEPENDENT_AMBULATORY_CARE_PROVIDER_SITE_OTHER): Payer: Self-pay | Admitting: Certified Nurse Midwife

## 2023-07-21 ENCOUNTER — Other Ambulatory Visit: Payer: Self-pay

## 2023-07-21 VITALS — BP 148/101 | HR 79 | Wt 213.8 lb

## 2023-07-21 DIAGNOSIS — I10 Essential (primary) hypertension: Secondary | ICD-10-CM

## 2023-07-21 DIAGNOSIS — Z01419 Encounter for gynecological examination (general) (routine) without abnormal findings: Secondary | ICD-10-CM

## 2023-07-21 MED ORDER — NIFEDIPINE ER OSMOTIC RELEASE 60 MG PO TB24: 60.0000 mg | ORAL_TABLET | Freq: Every day | ORAL | 6 refills | Status: DC

## 2023-07-21 NOTE — Progress Notes (Signed)
History:  [name] is a 38 y.o. [id number] who presents to clinic today for well woman afte missing postpartum exam. She was seen in ER recently for pain and received a thorough workup with diagnosis of renal stone and musculoskeletal pain.   The following portions of the patient's history were reviewed and updated as appropriate: allergies, current medications, family history, past medical history, social history, past surgical history and problem list.  Review of Systems:  Review of Systems  Constitutional:  Negative for chills, fever and weight loss.  Eyes:  Negative for blurred vision and double vision.  Respiratory:  Negative for cough, hemoptysis, sputum production, shortness of breath and wheezing.   Cardiovascular:  Negative for chest pain and palpitations.  Gastrointestinal:  Positive for abdominal pain and nausea. Negative for heartburn and vomiting.       Nausea with the pain on her lower left side. Colicky pain on left lower side. Renal stone seen on imaging in ER recently 06/24/23.    Genitourinary:  Positive for flank pain. Negative for dysuria, frequency, hematuria and urgency.       States this is still occasionally happening after renal stone seen 06/24/23.       Objective:  Physical Exam BP (!) 148/101   Pulse 79   Wt 213 lb 13.5 oz (97 kg)   LMP 06/19/2023   Breastfeeding Yes   BMI 34.52 kg/m  Physical Exam Vitals reviewed.  Constitutional:      General: She is not in acute distress.    Appearance: Normal appearance. She is normal weight. She is not ill-appearing, toxic-appearing or diaphoretic.  HENT:     Head: Normocephalic.  Cardiovascular:     Rate and Rhythm: Normal rate and regular rhythm.     Pulses: Normal pulses.     Heart sounds: Normal heart sounds.  Pulmonary:     Effort: Pulmonary effort is normal.     Breath sounds: Normal breath sounds.  Musculoskeletal:        General: Normal range of motion.  Skin:    General: Skin is  warm and dry.  Neurological:     General: No focal deficit present.     Mental Status: She is alert and oriented to person, place, and time.  Psychiatric:        Behavior: Behavior normal.        Thought Content: Thought content normal.        Judgment: Judgment normal.      Labs and Imaging No results found for this or any previous visit (from the past 24 hours).  No results found.   Assessment & Plan:  1. Well woman exam (Primary) - Resources for PCP provided.  - Never attended PP check in. Reports she had a hard labor and PP course because of PPH and the measures that were taken then. Presence provided.   2. Chronic hypertension - Initial BP 184/110, patient tearful at that time. Recheck 148/101, consistent with previous pressures in chart.  - Increase Procardia XL to 60mg  daily. PCP resources provided.   - In person interpreter Monica Wolfe used for entire episode of care.    Monica Wolfe, CNM 07/21/2023 3:24 PM

## 2023-08-19 ENCOUNTER — Ambulatory Visit: Payer: Self-pay

## 2023-12-20 ENCOUNTER — Emergency Department (HOSPITAL_COMMUNITY)
Admission: EM | Admit: 2023-12-20 | Discharge: 2023-12-20 | Disposition: A | Discharge status: Home / Self Care | Payer: Self-pay

## 2023-12-20 ENCOUNTER — Emergency Department (HOSPITAL_COMMUNITY): Payer: Self-pay

## 2023-12-20 ENCOUNTER — Encounter (HOSPITAL_COMMUNITY): Payer: Self-pay

## 2023-12-20 ENCOUNTER — Other Ambulatory Visit: Payer: Self-pay

## 2023-12-20 DIAGNOSIS — R109 Unspecified abdominal pain: Secondary | ICD-10-CM

## 2023-12-20 DIAGNOSIS — Z79899 Other long term (current) drug therapy: Secondary | ICD-10-CM | POA: Insufficient documentation

## 2023-12-20 DIAGNOSIS — I10 Essential (primary) hypertension: Secondary | ICD-10-CM | POA: Insufficient documentation

## 2023-12-20 DIAGNOSIS — N200 Calculus of kidney: Secondary | ICD-10-CM | POA: Insufficient documentation

## 2023-12-20 LAB — CBC WITH DIFFERENTIAL/PLATELET
Abs Immature Granulocytes: 0.03 K/uL (ref 0.00–0.07)
Basophils Absolute: 0 K/uL (ref 0.0–0.1)
Basophils Relative: 1 %
Eosinophils Absolute: 0.2 K/uL (ref 0.0–0.5)
Eosinophils Relative: 2 %
HCT: 39 % (ref 36.0–46.0)
Hemoglobin: 12.7 g/dL (ref 12.0–15.0)
Immature Granulocytes: 0 %
Lymphocytes Relative: 26 %
Lymphs Abs: 2 K/uL (ref 0.7–4.0)
MCH: 26.4 pg (ref 26.0–34.0)
MCHC: 32.6 g/dL (ref 30.0–36.0)
MCV: 81.1 fL (ref 80.0–100.0)
Monocytes Absolute: 0.5 K/uL (ref 0.1–1.0)
Monocytes Relative: 7 %
Neutro Abs: 5 K/uL (ref 1.7–7.7)
Neutrophils Relative %: 64 %
Platelets: 302 K/uL (ref 150–400)
RBC: 4.81 MIL/uL (ref 3.87–5.11)
RDW: 14.4 % (ref 11.5–15.5)
WBC: 7.7 K/uL (ref 4.0–10.5)
nRBC: 0 % (ref 0.0–0.2)

## 2023-12-20 LAB — BASIC METABOLIC PANEL WITH GFR
Anion gap: 10 (ref 5–15)
BUN: 9 mg/dL (ref 6–20)
CO2: 21 mmol/L — ABNORMAL LOW (ref 22–32)
Calcium: 9.1 mg/dL (ref 8.9–10.3)
Chloride: 103 mmol/L (ref 98–111)
Creatinine, Ser: 0.53 mg/dL (ref 0.44–1.00)
GFR, Estimated: >60 mL/min (ref >60)
Glucose, Bld: 104 mg/dL — ABNORMAL HIGH (ref 70–99)
Potassium: 3.4 mmol/L — ABNORMAL LOW (ref 3.5–5.1)
Sodium: 134 mmol/L — ABNORMAL LOW (ref 135–145)

## 2023-12-20 LAB — URINALYSIS, ROUTINE W REFLEX MICROSCOPIC
Bilirubin Urine: NEGATIVE
Glucose, UA: NEGATIVE mg/dL
Ketones, ur: NEGATIVE mg/dL
Nitrite: NEGATIVE
Protein, ur: 100 mg/dL — AB
RBC / HPF: >50 RBC/hpf (ref 0–5)
Specific Gravity, Urine: 1.019 (ref 1.005–1.030)
pH: 5 (ref 5.0–8.0)

## 2023-12-20 LAB — HCG, SERUM, QUALITATIVE: Preg, Serum: NEGATIVE

## 2023-12-20 MED ORDER — HYDROCODONE-ACETAMINOPHEN 5-325 MG PO TABS: 1.0000 | ORAL_TABLET | Freq: Four times a day (QID) | ORAL | 0 refills | Status: AC | PRN

## 2023-12-20 MED ORDER — KETOROLAC TROMETHAMINE 15 MG/ML IJ SOLN
15.0000 mg | Freq: Once | INTRAMUSCULAR | Status: AC
  Administered 2023-12-20: 15 mg via INTRAMUSCULAR

## 2023-12-20 MED ORDER — KETOROLAC TROMETHAMINE 15 MG/ML IJ SOLN
15.0000 mg | Freq: Once | INTRAMUSCULAR | Status: DC
Start: 2023-12-20 — End: 2023-12-20
  Filled 2023-12-20: qty 1

## 2023-12-20 MED ORDER — SODIUM CHLORIDE 0.9 % IV SOLN
1.0000 g | Freq: Once | INTRAVENOUS | Status: DC
  Filled 2023-12-20: qty 10

## 2023-12-20 MED ORDER — ONDANSETRON 4 MG PO TBDP
4.0000 mg | ORAL_TABLET | Freq: Once | ORAL | Status: AC
  Administered 2023-12-20: 4 mg via ORAL
  Filled 2023-12-20: qty 1

## 2023-12-20 MED ORDER — CIPROFLOXACIN HCL 500 MG PO TABS: 500.0000 mg | ORAL_TABLET | Freq: Two times a day (BID) | ORAL | 0 refills | Status: AC

## 2023-12-20 MED ORDER — HYDROCODONE-ACETAMINOPHEN 5-325 MG PO TABS
1.0000 | ORAL_TABLET | Freq: Once | ORAL | Status: AC
  Administered 2023-12-20: 1 via ORAL
  Filled 2023-12-20: qty 1

## 2023-12-20 NOTE — ED Provider Triage Note (Signed)
 Emergency Medicine Provider Triage Evaluation Note  Monica Wolfe , a 38 y.o. female  was evaluated in triage.  Pt complains of flank pain.  History of kidney stone.  Endorses pain and nausea that is worse from previous.  Waiting for urology workup/procedure.  Review of Systems  Positive: As above Negative: As above  Physical Exam  BP (!) 173/138 (BP Location: Right Arm)   Pulse 86   Temp 98.4 F (36.9 C)   Resp 17   LMP 12/17/2023 (Approximate)   SpO2 100%  Gen:   Awake, no distress   Resp:  Normal effort  MSK:   Moves extremities without difficulty  Other:    Medical Decision Making  Medically screening exam initiated at 12:46 PM.  Appropriate orders placed.  Monica Wolfe was informed that the remainder of the evaluation will be completed by another provider, this initial triage assessment does not replace that evaluation, and the importance of remaining in the ED until their evaluation is complete.     Monica Loge, PA-C 12/20/23 1247

## 2023-12-20 NOTE — ED Provider Notes (Signed)
  EMERGENCY DEPARTMENT AT [facility] Provider Note   CSN: [id number] Arrival date & time: 12/20/23  1235     Patient presents with: Flank Pain   Monica Wolfe is a 38 y.o. female with past medical history of kidney stone, HTN, gestational diabetes presents emergency department for evaluation of left flank pain, frequency, nausea since January 2025.  She was seen here in the emergency department and noted to have a 18 mm nonobstructing stone in the renal pelvis. [facility] weeks ago and was told that she would have a laser procedure to remove the stone.  Has called several times and left a message but has not heard back from them to schedule procedure.  No dysuria, burning with urination, fevers    Flank Pain       Prior to Admission medications   Medication Sig Start Date End Date Taking? Authorizing Provider  ciprofloxacin  (CIPRO ) 500 MG tablet Take 1 tablet (500 mg total) by mouth 2 (two) times daily for 7 days. 12/20/23 12/27/23 Yes Minnie Tinnie BRAVO, PA  HYDROcodone -acetaminophen  (NORCO/VICODIN) 5-325 MG tablet Take 1 tablet by mouth every 6 (six) hours as needed for up to 3 days for severe pain (pain score 7-10). 12/20/23 12/23/23 Yes Minnie Tinnie BRAVO, PA  acetaminophen  (TYLENOL ) 500 MG tablet Take 2 tablets (1,000 mg total) by mouth every 8 (eight) hours. Patient not taking: Reported on 07/21/2023 04/04/23   Nicholaus Almarie HERO, MD  cyclobenzaprine  (FLEXERIL ) 10 MG tablet Take 1 tablet (10 mg total) by mouth 2 (two) times daily as needed for muscle spasms. Patient not taking: Reported on 07/21/2023 06/24/23   Griselda Almarie, MD  furosemide  (LASIX ) 40 MG tablet Take 1 tablet (40 mg total) by mouth daily. Patient not taking: Reported on 07/21/2023 04/04/23   Nicholaus Almarie HERO, MD  ibuprofen  (ADVIL ) 800 MG tablet Take 1 tablet (800 mg total) by mouth 3 (three) times daily. Patient not taking: Reported on 07/21/2023 04/04/23   Nicholaus Almarie HERO,  MD  NIFEdipine  (PROCARDIA  XL) 60 MG 24 hr tablet Take 1 tablet (60 mg total) by mouth daily. 07/21/23   Warren-Hill, Camie LABOR, CNM  Prenatal Vit-Fe Fumarate-FA (PRENATAL VITAMINS) 28-0.8 MG TABS Take 1 tablet by mouth daily. Patient not taking: Reported on 07/21/2023 08/05/19   Eveline Lynwood KANDICE, MD  senna-docusate (SENOKOT-S) 8.6-50 MG tablet Take 2 tablets by mouth daily. Patient not taking: Reported on 07/21/2023 04/04/23   Nicholaus Almarie HERO, MD    Allergies: Patient has no known allergies.    Review of Systems  Genitourinary:  Positive for flank pain.    Updated Vital Signs BP (!) 170/100 (BP Location: Right Arm)   Pulse 62   Temp 98.4 F (36.9 C) (Oral)   Resp 16   LMP 12/17/2023 (Approximate)   SpO2 100%   Physical Exam Vitals and nursing note reviewed.  Constitutional:      General: She is not in acute distress.    Appearance: Normal appearance.  HENT:     Head: Normocephalic and atraumatic.  Eyes:     Conjunctiva/sclera: Conjunctivae normal.  Cardiovascular:     Rate and Rhythm: Normal rate.  Pulmonary:     Effort: Pulmonary effort is normal. No respiratory distress.  Abdominal:     General: Bowel sounds are normal.     Palpations: Abdomen is soft.     Tenderness: There is abdominal tenderness in the left lower quadrant. There is no right CVA tenderness, left  CVA tenderness, guarding or rebound.     Comments: Nonsurgical abdomen with no peritoneal signs.  No ecchymosis to abdomen nor retroperitoneum  Skin:    Coloration: Skin is not jaundiced or pale.  Neurological:     Mental Status: She is alert and oriented to person, place, and time. Mental status is at baseline.     (all labs ordered are listed, but only abnormal results are displayed) Labs Reviewed  BASIC METABOLIC PANEL WITH GFR - Abnormal; Notable for the following components:      Result Value   Sodium 134 (*)    Potassium 3.4 (*)    CO2 21 (*)    Glucose, Bld 104 (*)    All other components within  normal limits  URINALYSIS, ROUTINE W REFLEX MICROSCOPIC - Abnormal; Notable for the following components:   APPearance HAZY (*)    Hgb urine dipstick LARGE (*)    Protein, ur 100 (*)    Leukocytes,Ua TRACE (*)    Bacteria, UA MANY (*)    All other components within normal limits  URINE CULTURE  CBC WITH DIFFERENTIAL/PLATELET  HCG, SERUM, QUALITATIVE    EKG: None  Radiology: CT Renal Stone Study Result Date: 12/20/2023 CLINICAL DATA:  Left flank pain, nephrolithiasis EXAM: CT ABDOMEN AND PELVIS WITHOUT CONTRAST TECHNIQUE: Multidetector CT imaging of the abdomen and pelvis was performed following the standard protocol without IV contrast. RADIATION DOSE REDUCTION: This exam was performed according to the departmental dose-optimization program which includes automated exposure control, adjustment of the mA and/or kV according to patient size and/or use of iterative reconstruction technique. COMPARISON:  June 23, 2013 FINDINGS: lower chest: No acute findings. Hepatobiliary: Hepatic steatosis. Overall similar appearance of multiple hyperdense liver lesions measuring up to 1.4 cm. Gallbladder is unremarkable. Pancreas: Unremarkable. Spleen: Unremarkable. Adrenals/Urinary Tract: Adrenal glands are unremarkable. Overall similar appearance of 1.6 cm stone in the left renal pelvis. No additional additional punctate stones in the left lower pole. No hydronephrosis. No ureteral stones identified. Stomach/Bowel: No evidence of bowel obstruction or inflammation. Appendix is unremarkable. Vascular/Lymphatic: Normal caliber aorta. No lymphadenopathy by size criteria. Reproductive: Unremarkable. Other: No free air or free fluid. Musculoskeletal: No acute osseous findings. IMPRESSION: 1. Overall similar appearance 1.6 cm stone in the left renal pelvis. No hydronephrosis. 2. Similar appearance of multiple hypodense liver lesions. Nonemergent MRI of the abdomen with and without IV contrast is recommended for  definitive characterization. 3. Hepatic steatosis. Electronically Signed   By: Michaeline Blanch M.D.   On: 12/20/2023 15:04     Medications Ordered in the ED  ondansetron  (ZOFRAN -ODT) disintegrating tablet 4 mg (4 mg Oral Given 12/20/23 1249)  HYDROcodone -acetaminophen  (NORCO/VICODIN) 5-325 MG per tablet 1 tablet (1 tablet Oral Given 12/20/23 1249)  ketorolac  (TORADOL ) 15 MG/ML injection 15 mg (15 mg Intramuscular Given 12/20/23 1706)                                    Medical Decision Making Amount and/or Complexity of Data Reviewed Labs: ordered.  Risk Prescription drug management.   Patient presents to the ED for concern of left flank pain, frequency, nausea, this involves an extensive number of treatment options, and is a complaint that carries with it a high risk of complications and morbidity.  The differential diagnosis includes kidney stone, UTI, infected stone, intra-abdominal pathology, pregnancy, muscle strain   Co morbidities that complicate the patient evaluation  Previous kidney  stone in January 2025   Additional history obtained:  Additional history obtained from  Nursing   External records from outside source obtained and reviewed including triage note, ED note from 06/2023   Lab Tests:  I Ordered, and personally interpreted labs.  The pertinent results include:   No leukocytosis UA   Imaging Studies ordered:  I ordered imaging studies including renal study  I independently visualized and interpreted imaging which showed  Similar appearance 1.6 cm stone in the left renal pelvis.  No hydronephrosis. Similar appearance of multiple hypodense liver lesions I agree with the radiologist interpretation     Medicines ordered and prescription drug management:  I ordered medication including Norco, Toradol  for pain Reevaluation of the patient after these medicines showed that the patient improved I have reviewed the patients home medicines and have made  adjustments as needed    Consultations Obtained:  I requested consultation with the urology Dr. Francisca,  and discussed lab and imaging findings as well as pertinent plan - they recommend:  Monica Wolfe is within kidney not ureter so recommends outpatient antibiotic Recommend outpatient surgical removal Individually reached out to [facility]rology for follow-up as patient has been unable to get in contact with them DC and follow-up with [facility]   Problem List / ED Course:  Kidney stone Unchanged size from prior in January Has been unable to schedule appointment for kidney stone removal with [facility] UA appears infected and is not contaminated.  Will send urine culture. No leukocytosis, fever, tachycardia. Sent cipro  to pharmacy We will provide short course of narcotic pain medicine.  Discussed symptomatic care Liver lesions Incidental.  Unchanged from prior found on CT imaging in January Discussed this with patient as well as provided on DC paperwork.  Discussed following up with primary care provider for routine MRI   Reevaluation:  After the interventions noted above, I reevaluated the patient and found that they have :improved   Social Determinants of Health:  No PCP but is currently trying to establish 1   Dispostion:  After consideration of the diagnostic results and the patients response to treatment, I feel that the patent would benefit from outpatient management with [facility].   Discussed ED workup, disposition, return to ED precautions with patient who expresses understanding agrees with plan.  All questions answered to their satisfaction.  They are agreeable to plan.  Discharge instructions provided on paperwork  Final diagnoses:  Left flank pain  Kidney stone    ED Discharge Orders          Ordered    HYDROcodone -acetaminophen  (NORCO/VICODIN) 5-325 MG tablet  Every 6 hours PRN        12/20/23 1651    ciprofloxacin  (CIPRO ) 500 MG tablet   2 times daily        12/20/23 1726               Minnie Tinnie BRAVO, PA 12/20/23 1731    Ula Prentice SAUNDERS, MD 12/21/23 0004

## 2023-12-20 NOTE — ED Triage Notes (Signed)
 Pt c.o continued left flank pain since January. Pt saw a urologist 2 weeks ago and was told she had many stones, they want to do a laser procedure but pt states she has not heard back from them to get it scheduled.pt also c.o nausea

## 2023-12-20 NOTE — Discharge Instructions (Addendum)
 Thank you for letting us  evaluate you today.  You have the same size kidney stone that you had in January.  It measures 1.6 cm in the left kidney.  There is no fluid backup into the kidney.  Your urine was contaminated so we have sent a urine culture to ensure no infection.  If you need to be placed on antibiotics we will call you.  I reached out to urology who will reach out to [facility] urology to ensure that you have the stone removed.  However if you do not hear from them, please call to make an appointment for laser removal for kidney stone.  I have also sent you home with a short course of strong pain medicines to use as needed for severe pain.  Otherwise you can use ibuprofen  intermittently every 8 hours.  You may also use heating, cooling packs in areas of pain.  Also there are incidental findings of lesions on your liver that are same size sine january.  Please follow-up with primary care provider to get nonemergent MRI with contrast to follow-up on cause of these lesions.  I provided you with primary care provider if you do not have 1 but you may use another one if desired.  Return to emergency department if you experience severe worsening of pain otherwise please follow-up with [facility]

## 2023-12-21 ENCOUNTER — Other Ambulatory Visit: Payer: Self-pay | Admitting: Urology

## 2023-12-21 LAB — URINE CULTURE

## 2024-01-06 NOTE — Patient Instructions (Signed)
 DEBIDO AL COVID-19 SLO SE PERMITEN DOS VISITANTES (de 16 aos en adelante)  PARA QUE VENGAN CON USTED Y SE QUEDEN EN LA SALA DE ESPERA SOLAMENTE DURANTE EL PRE OP Y EL PROCEDIMIENTO.   **NO SE PERMITEN VISITAS EN EL REA DE CORTA ESTADA NI EN LA SALA DE RECUPERACIN!!**  SI VA A SER INGRESADO(A) AL HOSPITAL SLO SE LE PERMITEN CUATRO PERSONAS DE APOYO DURANTE LAS HORAS DE VISITA (7 AM -8PM)   La(s) persona(s) de apoyo debe(n) pasar nuestra evaluacin, entrar y salir con gel y usar la mscara en todo momento, incluso en la habitacin del paciente. Los pacientes tambin deben usar una mscara cuando el personal o su persona de apoyo estn en la habitacin. Los visitantes DEBEN LLEVAR ETIQUETA DE VISITANTE DE UNA MANERA VISIBLE. Un visitante adulto puede permanecer con usted durante la noche y DEBE estar en la habitacin a las 8 P.M.     Su procedimiento est programado en: 01/21/24   Presntese a la entrada principal del [facility]     Presntese a admisiones por la maana   Llame a este nmero si tiene problemas la maana de la ciruga al [phone]   No consuma alimentos: Despus de la medianoche   Despus de la medianoche puede tomar los siguientes lquidos hasta la(s) : 12:45 PM DEL DA DE LA CIRUGA  Agua Caf negro (con azcar, SIN LECHE, NI CREMA)  T normal y descafeinado (con azcar, SIN LECHE, NI CREMA)                              Gelatina normal (NO ROJA)                                           Helados de frutas (sin pulpa. NO DE COLOR ROJO)                                     Helados de hielo (NO ROJO)                                                                  Jugo: de manzana, uva BLANCA, arndano BLANCO Bebidas deportivas como Gatorade (NO ROJAS)  SIGA CUALQUIER INSTRUCCIN ADICIONAL PREOPERATORIA QUE HAYA RECIBIDO DEL OFICINA DE DU CIRUJANO!!!   La higiene bucal tambin es importante para reducir el riesgo de infeccin.                                    Recuerde - LVESE LOS DIENTES EN LA MAANA DE LA CIRUGA CON SU PASTA DENTAL HABITUAL   NO fume despus de la medianoche   [employer] medicamentos en la maana de la ciruga con UN SORBO DE AGUA: nifedipine .  NO TOME NINGN MEDICAMENTO ORAL PARA LA DIABETES EL DA DE LA CIRUGA  No debe trae ningn metal en el cuerpo, incluyendo pinzas para el cabello, joyas, ni aretes/pendientes             No use maquillaje, lociones/cremas, polvos, perfumes/colonias o desodorante  No use esmalte de uas, incluyendo los de gel ni S&S, uas artificiales/acrlicas o cualquier otro tipo de cobertura en las uas naturales, incluyendo las uas de las manos y [employer]. Si tiene uas artificiales, con capas de gel, etc. que necesite que le quiten en un saln de uas, por favor, pida que se lo quiten antes de la ciruga o la ciruga podra ser cancelada/retrasada si el cirujano o el anestesilogo consideran que no puede ser monitoreado(a) de una forma segura.   No se rasure en las 48 horas antes de la operacin.    No traiga objetos de valor al hospital. [place] NO SE HACE RESPONSABLE DE LOS OBJETOS DE VALOR.   Los contactos, las dentaduras o los puentes no se pueden usar durante la [place].   Melonie una bolsa pequea para la noche el da de la [place].   NO TRAIGA AL HOSPITAL LOS MEDICAMENTOS QUE TOMA EN CASA . LA FARMACIA LE SUMINISTRAR LOS MEDICAMENTOS QUE TENGA EN SU LISTA DE MEDICAMENTOS DURANTE SU ESTADA EN EL HOSPITAL!    Los pacientes dados de alta el mismo da de la ciruga no podrn Company secretary a casa.  Es NECESARIO que alguien se quede con usted durante las primeras 24 horas despus de la anestesia.   Instrucciones especiales: Melonie query copia de sus documentos de poder notarial y testamento vital el da de su ciruga si no los ha escaneado antes.              Por favor, lea las siguientes hojas informativas que le dieron: SI TIENE PREGUNTAS SOBRE SUS  INSTRUCCIONES PREOPERATORIAS POR FAVOR LLAME AL [phone]                        PREPARACIN PARA LA CIRUGA                                            Preparing for Surgery  Debido a que la piel no est esterilizada, sta necesita estar lo ms libre de grmenes como sea posible.  Usted puede reducir el nmero de grmenes en la piel lavndose con el jabn de CHG (Chlorahexidine gluconate) antes de la ciruga.  El CHG es un jabn antisptico el cual mata los grmenes y se une a la piel para continuar matando los grmenes incluso hasta despus de lavarse. POR FAVOR NO LO USE SI USTED TIENE ALERGIAS AL CHG.  SI LA PIEL SE IRRITA, DEJE DE USAR EL CHG.  NO SE RASURE DURANTE AL MENOS 12 HORAS ANTES DE LA PRIMERA DUCHA CON EL CHG. Siga estas instrucciones cuidadosamente:  Dchese la noche anterior a la [place] y de nuevo en la maana de la [place]. Si decide lavarse el cabello, lvelo con su champ normal primero. Enjuague el cabello y el cuerpo para quitarse el [place]. Use el CHG como lo hara con cualquier otro jabn lquido, usando una toallita o esponja vegetal o exfoliante. Aplique el CHG al cuerpo solamente DEL CUELLO PARA ABAJO.  No lo use cerca de los ojos o los genitales. No se lave con su jabn normal despus de usar el CHG. Squese con una toalla limpia.  Espere hasta la maana siguiente para aplicarse desodorantes, lociones, excepto en el da de la [place], NO SE APLIQUE LOCIONES. Use pijamas limpias o una bata. Coloque sbanas limpias en su cama la noche de su primera ducha - no duerma con mascotas. 10.  Use ropa limpia al venir al hospital.

## 2024-01-11 ENCOUNTER — Other Ambulatory Visit: Payer: Self-pay

## 2024-01-11 ENCOUNTER — Encounter (HOSPITAL_COMMUNITY): Payer: Self-pay

## 2024-01-11 ENCOUNTER — Encounter (HOSPITAL_COMMUNITY)
Admission: RE | Admit: 2024-01-11 | Discharge: 2024-01-11 | Disposition: A | Discharge status: Home / Self Care | Payer: Self-pay | Source: Ambulatory Visit | Attending: Urology | Admitting: Urology

## 2024-01-11 VITALS — BP 182/108 | HR 66 | Temp 98.6°F | Ht 66.0 in | Wt 230.0 lb

## 2024-01-11 DIAGNOSIS — Z01818 Encounter for other preprocedural examination: Secondary | ICD-10-CM | POA: Insufficient documentation

## 2024-01-11 DIAGNOSIS — O10919 Unspecified pre-existing hypertension complicating pregnancy, unspecified trimester: Secondary | ICD-10-CM

## 2024-01-11 DIAGNOSIS — E119 Type 2 diabetes mellitus without complications: Secondary | ICD-10-CM | POA: Insufficient documentation

## 2024-01-11 DIAGNOSIS — I10 Essential (primary) hypertension: Secondary | ICD-10-CM | POA: Insufficient documentation

## 2024-01-11 DIAGNOSIS — R9431 Abnormal electrocardiogram [ECG] [EKG]: Secondary | ICD-10-CM | POA: Insufficient documentation

## 2024-01-11 HISTORY — DX: Personal history of urinary calculi: Z87.442

## 2024-01-11 LAB — CBC
HCT: 40.1 % (ref 36.0–46.0)
Hemoglobin: 12.3 g/dL (ref 12.0–15.0)
MCH: 25.3 pg — ABNORMAL LOW (ref 26.0–34.0)
MCHC: 30.7 g/dL (ref 30.0–36.0)
MCV: 82.5 fL (ref 80.0–100.0)
Platelets: 315 K/uL (ref 150–400)
RBC: 4.86 MIL/uL (ref 3.87–5.11)
RDW: 14.7 % (ref 11.5–15.5)
WBC: 7.4 K/uL (ref 4.0–10.5)
nRBC: 0 % (ref 0.0–0.2)

## 2024-01-11 LAB — GLUCOSE, CAPILLARY: Glucose-Capillary: 124 mg/dL — ABNORMAL HIGH (ref 70–99)

## 2024-01-11 LAB — BASIC METABOLIC PANEL WITH GFR
Anion gap: 9 (ref 5–15)
BUN: 7 mg/dL (ref 6–20)
CO2: 27 mmol/L (ref 22–32)
Calcium: 9.7 mg/dL (ref 8.9–10.3)
Chloride: 101 mmol/L (ref 98–111)
Creatinine, Ser: 0.44 mg/dL (ref 0.44–1.00)
GFR, Estimated: >60 mL/min (ref >60)
Glucose, Bld: 115 mg/dL — ABNORMAL HIGH (ref 70–99)
Potassium: 4 mmol/L (ref 3.5–5.1)
Sodium: 137 mmol/L (ref 135–145)

## 2024-01-11 NOTE — Progress Notes (Signed)
 For Anesthesia: PCP - No PCP Cardiologist - N/A  Bowel Prep reminder:  Chest x-ray -  EKG - 01/11/24 Stress Test -  ECHO -  Cardiac Cath -  Pacemaker/ICD device last checked: Pacemaker orders received: Device Rep notified:  Spinal Cord Stimulator:N/A  Sleep Study - N/A CPAP -   Fasting Blood Sugar - N/A Checks Blood Sugar __0___ times a day Date and result of last Hgb A1c-  Last dose of GLP1 agonist- N/A GLP1 instructions:   Last dose of SGLT-2 inhibitors- N/A SGLT-2 instructions:   Blood Thinner Instructions:N/A Aspirin  Instructions: Last Dose:  Activity level: Can go up a flight of stairs and activities of daily living without stopping and without chest pain and/or shortness of breath   Able to exercise without chest pain and/or shortness of breath  Anesthesia review: Hx: HTN,DIA. BP was elevated during PST appointment: 168/111: 182/108. As per pt. Her BP has been elevated sometimes at home.She does not have PCP because the clinic that she used to go, closed.She has an appointment at the [facility] clinic for November . Pt. Was advised to go to urgent care and explained wath's going on with her BP. She also is aware of the chance of cancellation if her BP is elevated the DOS. Pt. Verbalized her understanding of the instructions and she will call RN back to informed about the outcome after urgent care visit.  Patient denies shortness of breath, fever, cough and chest pain at PAT appointment   Patient verbalized understanding of instructions that were reviewed over the telephone.

## 2024-01-12 ENCOUNTER — Ambulatory Visit (HOSPITAL_COMMUNITY): Payer: Self-pay

## 2024-01-12 LAB — HEMOGLOBIN A1C
Hgb A1c MFr Bld: 6 % — ABNORMAL HIGH (ref 4.8–5.6)
Mean Plasma Glucose: 126 mg/dL

## 2024-01-20 ENCOUNTER — Encounter (HOSPITAL_COMMUNITY): Payer: Self-pay | Admitting: Urology

## 2024-01-20 NOTE — H&P (Signed)
 CC/HPI: cc: urolihiasis   12/09/23: 38 year old woman with a history of urolithiasis found to have 16 mm left renal pelvis calculus. Has been having intermittent left flank pain for about 4 months. She has had a left PCNL in the past.     ALLERGIES: No Allergies    MEDICATIONS: NIFEdipine  ER 60 MG Tablet Extended Release 24 Hour     GU PSH: No GU PSH      PSH Notes: Kidney stones    NON-GU PSH: No Non-GU PSH    GU PMH: Dysuria - 2020 Renal calculus, Calculus of left kidney - 2015 Acute Cystitis/UTI, Acute cystitis without hematuria - 2015 Other microscopic hematuria, Microscopic hematuria - 2015      PMH Notes: Kidney stones    NON-GU PMH: Encounter for general adult medical examination without abnormal findings, Encounter for preventive health examination - 2015 Personal history of other diseases of the circulatory system, History of hypertension - 2015 Personal history of other endocrine, nutritional and metabolic disease, History of hypercholesterolemia - 2015 Hypertension    FAMILY HISTORY: No pertinent family history - Runs In Family   SOCIAL HISTORY: Marital Status: Married Preferred Language: English; Ethnicity:  Current Smoking Status: Patient has never smoked.   Tobacco Use Assessment Completed: Used Tobacco in last 30 days? Does not drink anymore.  Drinks 1 caffeinated drink per day.     Notes: Single, Caffeine  use, Unemployed, Four children, Alcohol use, Never a smoker   REVIEW OF SYSTEMS:    GU Review Female:   Patient reports frequent urination, burning /pain with urination, and get up at night to urinate. Patient denies hard to postpone urination, leakage of urine, stream starts and stops, trouble starting your stream, have to strain to urinate, and being pregnant.  Gastrointestinal (Upper):   Patient reports nausea. Patient denies vomiting and indigestion/ heartburn.  Gastrointestinal (Lower):   Patient denies diarrhea and constipation.  Constitutional:    Patient denies fever, night sweats, weight loss, and fatigue.  Skin:   Patient denies itching and skin rash/ lesion.  Eyes:   Patient denies blurred vision and double vision.  Ears/ Nose/ Throat:   Patient denies sore throat and sinus problems.  Hematologic/Lymphatic:   Patient denies swollen glands and easy bruising.  Cardiovascular:   Patient denies leg swelling and chest pains.  Respiratory:   Patient denies cough and shortness of breath.  Endocrine:   Patient denies excessive thirst.  Musculoskeletal:   Patient reports back pain. Patient denies joint pain.  Neurological:   Patient denies headaches and dizziness.  Psychologic:   Patient denies depression and anxiety.   VITAL SIGNS:      12/09/2023 10:45 AM  Weight 228 lb / 103.42 kg  Height 74 in / 187.96 cm  BP 116/83 mmHg  Pulse 79 /min  Temperature 97.5 F / 36.3 C  BMI 29.3 kg/m   MULTI-SYSTEM PHYSICAL EXAMINATION:    Constitutional: Well-nourished. No physical deformities. Normally developed. Good grooming.  Neck: Neck symmetrical, not swollen. Normal tracheal position.  Respiratory: No labored breathing, no use of accessory muscles.   Skin: No paleness, no jaundice, no cyanosis. No lesion, no ulcer, no rash.  Neurologic / Psychiatric: Oriented to time, oriented to place, oriented to person. No depression, no anxiety, no agitation.  Eyes: Normal conjunctivae. Normal eyelids.  Ears, Nose, Mouth, and Throat: Left ear no scars, no lesions, no masses. Right ear no scars, no lesions, no masses. Nose no scars, no lesions, no masses. Normal hearing. Normal lips.  Musculoskeletal: Normal gait and station of head and neck.     Complexity of Data:  Records Review:   Previous Patient Records, POC Tool  Urine Test Review:   Urinalysis  X-Ray Review: C.T. Abdomen/Pelvis: Reviewed Films. Reviewed Report. Discussed With Patient. IMPRESSION:  Hepatic steatosis.   Three rounded echogenic foci are noted in right hepatic lobe which  may  represent hemangiomas, but other pathology cannot be excluded.  When the patient is clinically stable and able to follow directions  and hold their breath (preferably as an outpatient) further  evaluation with dedicated abdominal MRI should be considered.   16 mm nonobstructive calculus seen in left renal pelvis.    Electronically Signed  By: Lynwood Landy Raddle M.D.  On: 06/24/2023 17:50    PROCEDURES:          Urinalysis w/Scope Dipstick Dipstick Cont'd Micro  Color: Yellow Bilirubin: Neg mg/dL WBC/hpf: 6 - 89/yeq  Appearance: Cloudy Ketones: Neg mg/dL RBC/hpf: NS (Not Seen)  Specific Gravity: 1.025 Blood: Neg ery/uL Bacteria: Few (10-25/hpf)  pH: 6.5 Protein: 3+ mg/dL Cystals: Amorph Phosphates  Glucose: Neg mg/dL Urobilinogen: 0.2 mg/dL Casts: NS (Not Seen)    Nitrites: Neg Trichomonas: Not Present    Leukocyte Esterase: 1+ leu/uL Mucous: Present      Epithelial Cells: 10 - 20/hpf      Yeast: NS (Not Seen)      Sperm: Not Present    ASSESSMENT:      ICD-10 Details  1 GU:   Renal calculus - N20.0 Chronic, Worsening  2   Flank Pain - R10.84 Acute, Uncomplicated   PLAN:           Orders Labs CULTURE, URINE          Document Letter(s):  Created for Patient: Clinical Summary         Notes:   1. Urolithiasis:  - Patient with 6 mm left renal pelvis calculus. We discussed PCNL versus staged ureteroscopy.  - Patient would like to move forward with staged ureteroscopy  - Risks benefits discussed with the patient including but not with to pain, leak, infection, damage to surrounding structures, need for staged procedure, stent discomfort, ureteral stricture

## 2024-01-20 NOTE — H&P (View-Only) (Signed)
 CC/HPI: cc: urolihiasis   12/09/23: 38 year old woman with a history of urolithiasis found to have 16 mm left renal pelvis calculus. Has been having intermittent left flank pain for about 4 months. She has had a left PCNL in the past.     ALLERGIES: No Allergies    MEDICATIONS: NIFEdipine  ER 60 MG Tablet Extended Release 24 Hour     GU PSH: No GU PSH      PSH Notes: Kidney stones    NON-GU PSH: No Non-GU PSH    GU PMH: Dysuria - 2020 Renal calculus, Calculus of left kidney - 2015 Acute Cystitis/UTI, Acute cystitis without hematuria - 2015 Other microscopic hematuria, Microscopic hematuria - 2015      PMH Notes: Kidney stones    NON-GU PMH: Encounter for general adult medical examination without abnormal findings, Encounter for preventive health examination - 2015 Personal history of other diseases of the circulatory system, History of hypertension - 2015 Personal history of other endocrine, nutritional and metabolic disease, History of hypercholesterolemia - 2015 Hypertension    FAMILY HISTORY: No pertinent family history - Runs In Family   SOCIAL HISTORY: Marital Status: Married Preferred Language: English; Ethnicity:  Current Smoking Status: Patient has never smoked.   Tobacco Use Assessment Completed: Used Tobacco in last 30 days? Does not drink anymore.  Drinks 1 caffeinated drink per day.     Notes: Single, Caffeine  use, Unemployed, Four children, Alcohol use, Never a smoker   REVIEW OF SYSTEMS:    GU Review Female:   Patient reports frequent urination, burning /pain with urination, and get up at night to urinate. Patient denies hard to postpone urination, leakage of urine, stream starts and stops, trouble starting your stream, have to strain to urinate, and being pregnant.  Gastrointestinal (Upper):   Patient reports nausea. Patient denies vomiting and indigestion/ heartburn.  Gastrointestinal (Lower):   Patient denies diarrhea and constipation.  Constitutional:    Patient denies fever, night sweats, weight loss, and fatigue.  Skin:   Patient denies itching and skin rash/ lesion.  Eyes:   Patient denies blurred vision and double vision.  Ears/ Nose/ Throat:   Patient denies sore throat and sinus problems.  Hematologic/Lymphatic:   Patient denies swollen glands and easy bruising.  Cardiovascular:   Patient denies leg swelling and chest pains.  Respiratory:   Patient denies cough and shortness of breath.  Endocrine:   Patient denies excessive thirst.  Musculoskeletal:   Patient reports back pain. Patient denies joint pain.  Neurological:   Patient denies headaches and dizziness.  Psychologic:   Patient denies depression and anxiety.   VITAL SIGNS:      12/09/2023 10:45 AM  Weight 228 lb / 103.42 kg  Height 74 in / 187.96 cm  BP 116/83 mmHg  Pulse 79 /min  Temperature 97.5 F / 36.3 C  BMI 29.3 kg/m   MULTI-SYSTEM PHYSICAL EXAMINATION:    Constitutional: Well-nourished. No physical deformities. Normally developed. Good grooming.  Neck: Neck symmetrical, not swollen. Normal tracheal position.  Respiratory: No labored breathing, no use of accessory muscles.   Skin: No paleness, no jaundice, no cyanosis. No lesion, no ulcer, no rash.  Neurologic / Psychiatric: Oriented to time, oriented to place, oriented to person. No depression, no anxiety, no agitation.  Eyes: Normal conjunctivae. Normal eyelids.  Ears, Nose, Mouth, and Throat: Left ear no scars, no lesions, no masses. Right ear no scars, no lesions, no masses. Nose no scars, no lesions, no masses. Normal hearing. Normal lips.  Musculoskeletal: Normal gait and station of head and neck.     Complexity of Data:  Records Review:   Previous Patient Records, POC Tool  Urine Test Review:   Urinalysis  X-Ray Review: C.T. Abdomen/Pelvis: Reviewed Films. Reviewed Report. Discussed With Patient. IMPRESSION:  Hepatic steatosis.   Three rounded echogenic foci are noted in right hepatic lobe which  may  represent hemangiomas, but other pathology cannot be excluded.  When the patient is clinically stable and able to follow directions  and hold their breath (preferably as an outpatient) further  evaluation with dedicated abdominal MRI should be considered.   16 mm nonobstructive calculus seen in left renal pelvis.    Electronically Signed  By: Lynwood Landy Raddle M.D.  On: 06/24/2023 17:50    PROCEDURES:          Urinalysis w/Scope Dipstick Dipstick Cont'd Micro  Color: Yellow Bilirubin: Neg mg/dL WBC/hpf: 6 - 89/yeq  Appearance: Cloudy Ketones: Neg mg/dL RBC/hpf: NS (Not Seen)  Specific Gravity: 1.025 Blood: Neg ery/uL Bacteria: Few (10-25/hpf)  pH: 6.5 Protein: 3+ mg/dL Cystals: Amorph Phosphates  Glucose: Neg mg/dL Urobilinogen: 0.2 mg/dL Casts: NS (Not Seen)    Nitrites: Neg Trichomonas: Not Present    Leukocyte Esterase: 1+ leu/uL Mucous: Present      Epithelial Cells: 10 - 20/hpf      Yeast: NS (Not Seen)      Sperm: Not Present    ASSESSMENT:      ICD-10 Details  1 GU:   Renal calculus - N20.0 Chronic, Worsening  2   Flank Pain - R10.84 Acute, Uncomplicated   PLAN:           Orders Labs CULTURE, URINE          Document Letter(s):  Created for Patient: Clinical Summary         Notes:   1. Urolithiasis:  - Patient with 6 mm left renal pelvis calculus. We discussed PCNL versus staged ureteroscopy.  - Patient would like to move forward with staged ureteroscopy  - Risks benefits discussed with the patient including but not with to pain, leak, infection, damage to surrounding structures, need for staged procedure, stent discomfort, ureteral stricture

## 2024-01-21 ENCOUNTER — Ambulatory Visit (HOSPITAL_COMMUNITY): Payer: Self-pay

## 2024-01-21 ENCOUNTER — Encounter (HOSPITAL_COMMUNITY)
Admission: RE | Disposition: A | Discharge status: Home / Self Care | Payer: Self-pay | Source: Ambulatory Visit | Attending: Urology

## 2024-01-21 ENCOUNTER — Ambulatory Visit (HOSPITAL_COMMUNITY)
Admission: RE | Admit: 2024-01-21 | Discharge: 2024-01-21 | Disposition: A | Discharge status: Home / Self Care | Payer: Self-pay | Source: Ambulatory Visit | Attending: Urology | Admitting: Urology

## 2024-01-21 ENCOUNTER — Ambulatory Visit (HOSPITAL_COMMUNITY): Payer: Self-pay | Admitting: Medical

## 2024-01-21 ENCOUNTER — Encounter (HOSPITAL_COMMUNITY): Payer: Self-pay | Admitting: Urology

## 2024-01-21 ENCOUNTER — Ambulatory Visit (HOSPITAL_COMMUNITY): Payer: Self-pay | Admitting: Anesthesiology

## 2024-01-21 ENCOUNTER — Other Ambulatory Visit: Payer: Self-pay

## 2024-01-21 DIAGNOSIS — N200 Calculus of kidney: Secondary | ICD-10-CM

## 2024-01-21 DIAGNOSIS — Z01818 Encounter for other preprocedural examination: Secondary | ICD-10-CM

## 2024-01-21 HISTORY — DX: Prediabetes: R73.03

## 2024-01-21 LAB — GLUCOSE, CAPILLARY: Glucose-Capillary: 124 mg/dL — ABNORMAL HIGH (ref 70–99)

## 2024-01-21 LAB — POCT PREGNANCY, URINE: Preg Test, Ur: NEGATIVE

## 2024-01-21 SURGERY — CYSTOSCOPY/URETEROSCOPY/HOLMIUM LASER/STENT PLACEMENT
Anesthesia: General | Laterality: Left

## 2024-01-21 MED ORDER — TAMSULOSIN HCL 0.4 MG PO CAPS: 0.4000 mg | ORAL_CAPSULE | Freq: Every day | ORAL | 0 refills | Status: AC

## 2024-01-21 MED ORDER — CEPHALEXIN 250 MG PO CAPS: 250.0000 mg | ORAL_CAPSULE | Freq: Every day | ORAL | 0 refills | Status: AC

## 2024-01-21 MED ORDER — OXYCODONE HCL 5 MG PO TABS
5.0000 mg | ORAL_TABLET | Freq: Once | ORAL | Status: AC | PRN
  Administered 2024-01-21: 5 mg via ORAL

## 2024-01-21 MED ORDER — LACTATED RINGERS IV SOLN: INTRAVENOUS | Status: DC

## 2024-01-21 MED ORDER — MIDAZOLAM HCL 2 MG/2ML IJ SOLN
INTRAMUSCULAR | Status: AC
  Filled 2024-01-21: qty 2

## 2024-01-21 MED ORDER — PROPOFOL 10 MG/ML IV BOLUS
INTRAVENOUS | Status: AC
  Filled 2024-01-21: qty 20

## 2024-01-21 MED ORDER — ONDANSETRON HCL 4 MG/2ML IJ SOLN
INTRAMUSCULAR | Status: DC | PRN
  Administered 2024-01-21: 4 mg via INTRAVENOUS

## 2024-01-21 MED ORDER — CHLORHEXIDINE GLUCONATE 0.12 % MT SOLN
15.0000 mL | Freq: Once | OROMUCOSAL | Status: AC
  Administered 2024-01-21: 15 mL via OROMUCOSAL

## 2024-01-21 MED ORDER — HYOSCYAMINE SULFATE 0.125 MG PO TBDP: 0.1250 mg | ORAL_TABLET | Freq: Four times a day (QID) | ORAL | 0 refills | Status: DC | PRN

## 2024-01-21 MED ORDER — ROCURONIUM BROMIDE 10 MG/ML (PF) SYRINGE
PREFILLED_SYRINGE | INTRAVENOUS | Status: AC
  Filled 2024-01-21: qty 10

## 2024-01-21 MED ORDER — ACETAMINOPHEN 10 MG/ML IV SOLN
1000.0000 mg | Freq: Once | INTRAVENOUS | Status: DC | PRN
  Administered 2024-01-21: 1000 mg via INTRAVENOUS

## 2024-01-21 MED ORDER — OXYCODONE HCL 5 MG PO TABS
ORAL_TABLET | ORAL | Status: AC
  Filled 2024-01-21: qty 1

## 2024-01-21 MED ORDER — PROPOFOL 10 MG/ML IV BOLUS
INTRAVENOUS | Status: DC | PRN
Start: 2024-01-21 — End: 2024-01-21
  Administered 2024-01-21: 200 mg via INTRAVENOUS

## 2024-01-21 MED ORDER — DEXAMETHASONE SODIUM PHOSPHATE 10 MG/ML IJ SOLN
INTRAMUSCULAR | Status: AC
  Filled 2024-01-21: qty 4

## 2024-01-21 MED ORDER — OXYCODONE HCL 5 MG/5ML PO SOLN: 5.0000 mg | Freq: Once | ORAL | Status: AC | PRN

## 2024-01-21 MED ORDER — SODIUM CHLORIDE 0.9 % IR SOLN
Status: DC | PRN
  Administered 2024-01-21: 3000 mL

## 2024-01-21 MED ORDER — FENTANYL CITRATE (PF) 100 MCG/2ML IJ SOLN
INTRAMUSCULAR | Status: DC | PRN
  Administered 2024-01-21: 50 ug via INTRAVENOUS
  Administered 2024-01-21: 25 ug via INTRAVENOUS
  Administered 2024-01-21: 50 ug via INTRAVENOUS

## 2024-01-21 MED ORDER — CEFAZOLIN SODIUM-DEXTROSE 2-4 GM/100ML-% IV SOLN
2.0000 g | INTRAVENOUS | Status: AC
  Administered 2024-01-21: 2 g via INTRAVENOUS
  Filled 2024-01-21: qty 100

## 2024-01-21 MED ORDER — FENTANYL CITRATE (PF) 100 MCG/2ML IJ SOLN
INTRAMUSCULAR | Status: AC
  Filled 2024-01-21: qty 2

## 2024-01-21 MED ORDER — TRAMADOL HCL 50 MG PO TABS: 50.0000 mg | ORAL_TABLET | Freq: Four times a day (QID) | ORAL | 0 refills | Status: AC | PRN

## 2024-01-21 MED ORDER — LIDOCAINE HCL (PF) 2 % IJ SOLN
INTRAMUSCULAR | Status: AC
  Filled 2024-01-21: qty 20

## 2024-01-21 MED ORDER — FENTANYL CITRATE PF 50 MCG/ML IJ SOSY: 25.0000 ug | PREFILLED_SYRINGE | INTRAMUSCULAR | Status: DC | PRN

## 2024-01-21 MED ORDER — ONDANSETRON HCL 4 MG/2ML IJ SOLN
INTRAMUSCULAR | Status: AC
  Filled 2024-01-21: qty 8

## 2024-01-21 MED ORDER — LIDOCAINE HCL (CARDIAC) PF 100 MG/5ML IV SOSY
PREFILLED_SYRINGE | INTRAVENOUS | Status: DC | PRN
  Administered 2024-01-21: 80 mg via INTRAVENOUS

## 2024-01-21 MED ORDER — ORAL CARE MOUTH RINSE: 15.0000 mL | Freq: Once | OROMUCOSAL | Status: AC

## 2024-01-21 MED ORDER — IOHEXOL 300 MG/ML  SOLN
INTRAMUSCULAR | Status: DC | PRN
  Administered 2024-01-21: 50 mL

## 2024-01-21 MED ORDER — ONDANSETRON HCL 4 MG/2ML IJ SOLN: 4.0000 mg | Freq: Once | INTRAMUSCULAR | Status: DC | PRN

## 2024-01-21 MED ORDER — ACETAMINOPHEN 10 MG/ML IV SOLN
INTRAVENOUS | Status: AC
  Filled 2024-01-21: qty 100

## 2024-01-21 MED ORDER — FENTANYL CITRATE (PF) 100 MCG/2ML IJ SOLN
INTRAMUSCULAR | Status: AC
Start: 2024-01-21 — End: 2024-01-21
  Filled 2024-01-21: qty 2

## 2024-01-21 MED ORDER — MIDAZOLAM HCL 5 MG/5ML IJ SOLN
INTRAMUSCULAR | Status: DC | PRN
  Administered 2024-01-21: 2 mg via INTRAVENOUS

## 2024-01-21 MED ORDER — DEXAMETHASONE SODIUM PHOSPHATE 4 MG/ML IJ SOLN
INTRAMUSCULAR | Status: DC | PRN
  Administered 2024-01-21: 10 mg via INTRAVENOUS

## 2024-01-21 SURGICAL SUPPLY — 18 items
BAG URO CATCHER STRL LF (MISCELLANEOUS) ×1 IMPLANT
BASKET ZERO TIP NITINOL 2.4FR (BASKET) IMPLANT
CATH URETL OPEN 5X70 (CATHETERS) ×1 IMPLANT
CLOTH BEACON ORANGE TIMEOUT ST (SAFETY) ×1 IMPLANT
DRSG TEGADERM 2-3/8X2-3/4 SM (GAUZE/BANDAGES/DRESSINGS) IMPLANT
FIBER LASER MOSES 200 DFL (Laser) IMPLANT
GLOVE BIO SURGEON STRL SZ 6.5 (GLOVE) ×1 IMPLANT
GOWN STRL REUS W/ TWL LRG LVL3 (GOWN DISPOSABLE) ×1 IMPLANT
GUIDEWIRE STR DUAL SENSOR (WIRE) ×1 IMPLANT
KIT TURNOVER KIT A (KITS) ×1 IMPLANT
MANIFOLD NEPTUNE II (INSTRUMENTS) ×1 IMPLANT
PACK CYSTO (CUSTOM PROCEDURE TRAY) ×1 IMPLANT
SHEATH NAVIGATOR HD 11/13X28 (SHEATH) IMPLANT
SHEATH NAVIGATOR HD 11/13X36 (SHEATH) IMPLANT
STENT URET 6FRX26 CONTOUR (STENTS) IMPLANT
TRACTIP FLEXIVA PULS ID 200XHI (Laser) IMPLANT
TUBING CONNECTING 10 (TUBING) ×1 IMPLANT
TUBING UROLOGY SET (TUBING) ×1 IMPLANT

## 2024-01-21 NOTE — Anesthesia Preprocedure Evaluation (Signed)
 Anesthesia Evaluation  Patient identified by MRN, date of birth, ID band Patient awake    Reviewed: Allergy & Precautions, NPO status , Patient's Chart, lab work & pertinent test results, reviewed documented beta blocker date and time   History of Anesthesia Complications Negative for: history of anesthetic complications  Airway Mallampati: II  TM Distance: >3 FB     Dental no notable dental hx.    Pulmonary neg shortness of breath, neg COPD   breath sounds clear to auscultation       Cardiovascular hypertension, (-) CAD, (-) Past MI and (-) Cardiac Stents  Rhythm:Regular Rate:Normal     Neuro/Psych neg Seizures  negative psych ROS   GI/Hepatic ,neg GERD  ,,(+) neg Cirrhosis        Endo/Other  diabetes, Gestational    Renal/GU Renal disease     Musculoskeletal   Abdominal   Peds  Hematology   Anesthesia Other Findings   Reproductive/Obstetrics                              Anesthesia Physical Anesthesia Plan  ASA: 2  Anesthesia Plan: General   Post-op Pain Management:    Induction: Intravenous  PONV Risk Score and Plan: 2 and Ondansetron  and Dexamethasone   Airway Management Planned: LMA  Additional Equipment:   Intra-op Plan:   Post-operative Plan: Extubation in OR  Informed Consent: I have reviewed the patients History and Physical, chart, labs and discussed the procedure including the risks, benefits and alternatives for the proposed anesthesia with the patient or authorized representative who has indicated his/her understanding and acceptance.     Dental advisory given  Plan Discussed with: CRNA  Anesthesia Plan Comments:          Anesthesia Quick Evaluation

## 2024-01-21 NOTE — Op Note (Signed)
 Preoperative diagnosis: left renal calculus  Postoperative diagnosis: left renal calculus  Procedure:  Cystoscopy left ureteroscopy, laser lithotripsy, basket stone extraction left 63F x 26cm ureteral stent placement - no string left retrograde pyelography with interpretation  Surgeon: Valli Shank, MD  Anesthesia: General  Complications: None  Intraoperative findings:  Normal urethra Bilateral orthotropic ureteral orifices left retrograde pyelography demonstrated a filling defect within the left renal pelvis consistent with the patient's known calculus without other abnormalities. Bladder mucosa normal without masses   EBL: Minimal  Specimens: none  Disposition of specimens: [facility] for stone analysis  Indication: Monica Wolfe is a 38 y.o.   patient with a 1.6cm left renal stone and associated left symptoms. After reviewing the management options for treatment, the patient elected to proceed with the above surgical procedure(s). We have discussed the potential benefits and risks of the procedure, side effects of the proposed treatment, the likelihood of the patient achieving the goals of the procedure, and any potential problems that might occur during the procedure or recuperation. Informed consent has been obtained.   Description of procedure:  The patient was taken to the operating room and general anesthesia was induced.  The patient was placed in the dorsal lithotomy position, prepped and draped in the usual sterile fashion, and preoperative antibiotics were administered. A preoperative time-out was performed.   Cystourethroscopy was performed.  The patient's urethra was examined and was normal.  The bladder was then systematically examined in its entirety. There was no evidence for any bladder tumors, stones, or other mucosal pathology.    Attention then turned to the left ureteral orifice and a ureteral catheter was used to intubate the  ureteral orifice.  Omnipaque  contrast was injected through the ureteral catheter and a retrograde pyelogram was performed with findings as dictated above.  A 0.38 sensor guidewire was then advanced through the open-ended ureteral catheter and up the left ureter into the renal pelvis under fluoroscopic guidance.  The open-ended ureteral catheter was removed.  Next a second sensor wire was advanced alongside the first wire and up to the kidney and fluoroscopic guidance.  The cystoscope was removed and 1 wire secured as a safety wire.  A ureteral access sheath was placed over the second wire and advanced the proximal ureter fluoroscopic guidance.  The inner sheath and wire were removed.  Flexible ureteroscopy took place.  The large known renal pelvis calculus was easily seen.   The stone was then fragmented with the 200 micron holmium laser fiber.  Lithotripsy continued until visibility was poor.  Based on the size of the stone this will be a staged procedure.  Attempts at removing some of the stone fragments with ZeroTip basket were unsuccessful due to poor visibility.  The ureteral access sheath was removed and using with the ureteroscope taking care to examine the ureter on the way out.  There is no trauma or stone fragments noted in the ureter.  The wire was then backloaded through the cystoscope and a ureteral stent was advance over the wire using Seldinger technique.  The stent was positioned appropriately under fluoroscopic and cystoscopic guidance.  The wire was then removed with an adequate stent curl noted in the renal pelvis as well as in the bladder.  The bladder was then emptied and the procedure ended.  The patient appeared to tolerate the procedure well and without complications.  The patient was able to be awakened and transferred to the recovery unit in satisfactory condition.  Disposition: Patient will need staged procedure for residual stone fragments.

## 2024-01-21 NOTE — Transfer of Care (Signed)
 Immediate Anesthesia Transfer of Care Note  Patient: Monica Wolfe  Procedure(s) Performed: Procedure(s) with comments: CYSTOSCOPY/URETEROSCOPY/HOLMIUM LASER/STENT PLACEMENT (Left) - CYSTOSCOPY/LEFT URETEROSCOPY/HOLMIUM LASER/STENT PLACEMENT/RETROGRADE PYELOGRAM  Patient Location: PACU  Anesthesia Type:General  Level of Consciousness:  sedated, patient cooperative and responds to stimulation  Airway & Oxygen Therapy:Patient Spontanous Breathing and Patient connected to face mask oxgen  Post-op Assessment:  Report given to PACU RN and Post -op Vital signs reviewed and stable  Post vital signs:  Reviewed and stable  Last Vitals:  Vitals:   01/21/24 0555  BP: (!) 138/90  Pulse: 94  Resp: 16  Temp: 36.8 C  SpO2: 99%    Complications: No apparent anesthesia complications

## 2024-01-21 NOTE — Anesthesia Postprocedure Evaluation (Signed)
 Anesthesia Post Note  Patient: Monica Wolfe  Procedure(s) Performed: CYSTOSCOPY/URETEROSCOPY/HOLMIUM LASER/STENT PLACEMENT (Left)     Patient location during evaluation: PACU Anesthesia Type: General Level of consciousness: awake and alert Pain management: pain level controlled Vital Signs Assessment: post-procedure vital signs reviewed and stable Respiratory status: spontaneous breathing, nonlabored ventilation, respiratory function stable and patient connected to nasal cannula oxygen Cardiovascular status: blood pressure returned to baseline and stable Postop Assessment: no apparent nausea or vomiting Anesthetic complications: no   No notable events documented.  Last Vitals:  Vitals:   01/21/24 0930 01/21/24 0955  BP: 130/85 (!) 144/91  Pulse: 88 91  Resp: 14 15  Temp:  (!) 36.2 C  SpO2: 98% 100%    Last Pain:  Vitals:   01/21/24 0955  TempSrc:   PainSc: 4                  Lynwood MARLA Cornea

## 2024-01-21 NOTE — Anesthesia Procedure Notes (Signed)
 Procedure Name: LMA Insertion Date/Time: 01/21/2024 7:44 AM  Performed by: Vincenzo Show, CRNAPre-anesthesia Checklist: Patient identified, Emergency Drugs available, Suction available, Patient being monitored and Timeout performed Patient Re-evaluated:Patient Re-evaluated prior to induction Oxygen Delivery Method: Circle system utilized Preoxygenation: Pre-oxygenation with 100% oxygen Induction Type: IV induction Ventilation: Mask ventilation without difficulty LMA: LMA inserted LMA Size: 4.0 Number of attempts: 1 Placement Confirmation: positive ETCO2 and breath sounds checked- equal and bilateral Tube secured with: Tape Dental Injury: Teeth and Oropharynx as per pre-operative assessment  Comments: AT LMA

## 2024-01-21 NOTE — Discharge Instructions (Addendum)
 DISCHARGE INSTRUCTIONS FOR KIDNEY STONE/URETERAL STENT   MEDICATIONS:  1. Resume all your other meds from home  2. AZO over the counter can help with the burning/stinging when you urinate. 3. Tramadol  is for moderate/severe pain, otherwise taking up to 1000 mg every 6 hours of plainTylenol will help treat your pain.   4. Hyoscyamine  can help with bladder spasms. 5. Tamsulosin  can help with stent discomfort 6. Please take daily cephalexin  to prevent infection   ACTIVITY:  1. No strenuous activity x 1week  2. No driving while on narcotic pain medications  3. Drink plenty of water  4. Continue to walk at home - you can still get blood clots when you are at home, so keep active, but don't over do it.  5. May return to work/school tomorrow or when you feel ready   BATHING:  1. You can shower and we recommend daily showers   SIGNS/SYMPTOMS TO CALL:  Please call us  if you have a fever greater than 101.5, uncontrolled nausea/vomiting, uncontrolled pain, dizziness, unable to urinate, bloody urine, chest pain, shortness of breath, leg swelling, leg pain, redness around wound, drainage from wound, or any other concerns or questions.   You can reach us  at [phone].   FOLLOW-UP:  1. You have a stent in place which will help protect your kidney. You will need a second surgery to remove the rest of the stone fragments from your kidney. We will contact you to schedule this.

## 2024-01-21 NOTE — Interval H&P Note (Signed)
 History and Physical Interval Note:  01/21/2024 7:16 AM  Monica Wolfe  has presented today for surgery, with the diagnosis of LEFT RENAL CALCULUS.  The various methods of treatment have been discussed with the patient and family. After consideration of risks, benefits and other options for treatment, the patient has consented to  Procedure(s) with comments: CYSTOSCOPY/URETEROSCOPY/HOLMIUM LASER/STENT PLACEMENT (Left) - CYSTOSCOPY/LEFT URETEROSCOPY/HOLMIUM LASER/STENT PLACEMENT/RETROGRADE PYELOGRAM as a surgical intervention.  The patient's history has been reviewed, patient examined, no change in status, stable for surgery.  I have reviewed the patient's chart and labs.  Questions were answered to the patient's satisfaction.     Monica Wolfe

## 2024-01-22 ENCOUNTER — Encounter (HOSPITAL_COMMUNITY): Payer: Self-pay | Admitting: Urology

## 2024-01-27 NOTE — Progress Notes (Signed)
 Attempted to obtain medical history via telephone, unable to reach at this time. HIPAA compliant voicemail message left requesting return call to pre surgical testing department.  Used [employer] for pre op appointment phone call, interpreter ID # [phone]

## 2024-01-28 NOTE — Progress Notes (Signed)
 Attempted to obtain medical history via telephone, unable to reach at this time. HIPAA compliant voicemail message left requesting return call to pre surgical testing department.   Used [employer] for pre op appointment phone call, interpreter ID # [id number].

## 2024-02-02 ENCOUNTER — Other Ambulatory Visit: Payer: Self-pay

## 2024-02-02 ENCOUNTER — Encounter (HOSPITAL_COMMUNITY): Payer: Self-pay | Admitting: Urology

## 2024-02-04 ENCOUNTER — Encounter (HOSPITAL_COMMUNITY): Payer: Self-pay | Admitting: Urology

## 2024-02-04 ENCOUNTER — Ambulatory Visit (HOSPITAL_COMMUNITY): Payer: Self-pay | Admitting: Anesthesiology

## 2024-02-04 ENCOUNTER — Encounter (HOSPITAL_COMMUNITY)
Admission: RE | Disposition: A | Discharge status: Home / Self Care | Payer: Self-pay | Source: Home / Self Care | Attending: Urology

## 2024-02-04 ENCOUNTER — Ambulatory Visit (HOSPITAL_COMMUNITY)
Admission: RE | Admit: 2024-02-04 | Discharge: 2024-02-04 | Disposition: A | Discharge status: Home / Self Care | Payer: Self-pay | Attending: Urology | Admitting: Urology

## 2024-02-04 ENCOUNTER — Ambulatory Visit (HOSPITAL_COMMUNITY): Payer: Self-pay

## 2024-02-04 DIAGNOSIS — N200 Calculus of kidney: Secondary | ICD-10-CM

## 2024-02-04 DIAGNOSIS — Z01818 Encounter for other preprocedural examination: Secondary | ICD-10-CM

## 2024-02-04 DIAGNOSIS — I10 Essential (primary) hypertension: Secondary | ICD-10-CM | POA: Insufficient documentation

## 2024-02-04 DIAGNOSIS — Z79899 Other long term (current) drug therapy: Secondary | ICD-10-CM | POA: Insufficient documentation

## 2024-02-04 DIAGNOSIS — N201 Calculus of ureter: Secondary | ICD-10-CM | POA: Insufficient documentation

## 2024-02-04 LAB — POCT PREGNANCY, URINE: Preg Test, Ur: NEGATIVE

## 2024-02-04 LAB — BASIC METABOLIC PANEL WITH GFR
Anion gap: 15 (ref 5–15)
BUN: 8 mg/dL (ref 6–20)
CO2: 24 mmol/L (ref 22–32)
Calcium: 8.7 mg/dL — ABNORMAL LOW (ref 8.9–10.3)
Chloride: 99 mmol/L (ref 98–111)
Creatinine, Ser: 0.72 mg/dL (ref 0.44–1.00)
GFR, Estimated: >60 mL/min (ref >60)
Glucose, Bld: 109 mg/dL — ABNORMAL HIGH (ref 70–99)
Potassium: 3.5 mmol/L (ref 3.5–5.1)
Sodium: 138 mmol/L (ref 135–145)

## 2024-02-04 LAB — CBC
HCT: 39.3 % (ref 36.0–46.0)
Hemoglobin: 12.4 g/dL (ref 12.0–15.0)
MCH: 26.3 pg (ref 26.0–34.0)
MCHC: 31.6 g/dL (ref 30.0–36.0)
MCV: 83.3 fL (ref 80.0–100.0)
Platelets: 316 K/uL (ref 150–400)
RBC: 4.72 MIL/uL (ref 3.87–5.11)
RDW: 16 % — ABNORMAL HIGH (ref 11.5–15.5)
WBC: 7 K/uL (ref 4.0–10.5)
nRBC: 0 % (ref 0.0–0.2)

## 2024-02-04 SURGERY — CYSTOSCOPY/URETEROSCOPY/HOLMIUM LASER/STENT PLACEMENT
Anesthesia: General | Laterality: Left

## 2024-02-04 MED ORDER — CHLORHEXIDINE GLUCONATE 0.12 % MT SOLN
15.0000 mL | Freq: Once | OROMUCOSAL | Status: AC
  Administered 2024-02-04: 15 mL via OROMUCOSAL

## 2024-02-04 MED ORDER — PROPOFOL 10 MG/ML IV BOLUS
INTRAVENOUS | Status: AC
  Filled 2024-02-04: qty 20

## 2024-02-04 MED ORDER — ACETAMINOPHEN 10 MG/ML IV SOLN
1000.0000 mg | Freq: Once | INTRAVENOUS | Status: DC | PRN
  Administered 2024-02-04: 1000 mg via INTRAVENOUS

## 2024-02-04 MED ORDER — FENTANYL CITRATE PF 50 MCG/ML IJ SOSY
PREFILLED_SYRINGE | INTRAMUSCULAR | Status: AC
  Filled 2024-02-04: qty 1

## 2024-02-04 MED ORDER — OXYCODONE HCL 5 MG PO TABS
ORAL_TABLET | ORAL | Status: AC
  Filled 2024-02-04: qty 1

## 2024-02-04 MED ORDER — OXYCODONE HCL 5 MG PO TABS
5.0000 mg | ORAL_TABLET | Freq: Once | ORAL | Status: AC | PRN
  Administered 2024-02-04: 5 mg via ORAL

## 2024-02-04 MED ORDER — FENTANYL CITRATE (PF) 100 MCG/2ML IJ SOLN
INTRAMUSCULAR | Status: AC
  Filled 2024-02-04: qty 2

## 2024-02-04 MED ORDER — LACTATED RINGERS IV SOLN: INTRAVENOUS | Status: DC

## 2024-02-04 MED ORDER — MIDAZOLAM HCL 2 MG/2ML IJ SOLN
INTRAMUSCULAR | Status: AC
  Filled 2024-02-04: qty 2

## 2024-02-04 MED ORDER — HYDROCODONE-ACETAMINOPHEN 5-325 MG PO TABS: 1.0000 | ORAL_TABLET | Freq: Four times a day (QID) | ORAL | 0 refills | Status: AC | PRN

## 2024-02-04 MED ORDER — AMISULPRIDE (ANTIEMETIC) 5 MG/2ML IV SOLN: 10.0000 mg | Freq: Once | INTRAVENOUS | Status: DC | PRN

## 2024-02-04 MED ORDER — PROPOFOL 10 MG/ML IV BOLUS
INTRAVENOUS | Status: DC | PRN
  Administered 2024-02-04: 200 mg via INTRAVENOUS

## 2024-02-04 MED ORDER — ONDANSETRON HCL 4 MG/2ML IJ SOLN
INTRAMUSCULAR | Status: DC | PRN
  Administered 2024-02-04: 4 mg via INTRAVENOUS

## 2024-02-04 MED ORDER — ORAL CARE MOUTH RINSE: 15.0000 mL | Freq: Once | OROMUCOSAL | Status: AC

## 2024-02-04 MED ORDER — LIDOCAINE HCL (CARDIAC) PF 100 MG/5ML IV SOSY
PREFILLED_SYRINGE | INTRAVENOUS | Status: DC | PRN
Start: 2024-02-04 — End: 2024-02-04
  Administered 2024-02-04: 60 mg via INTRATRACHEAL

## 2024-02-04 MED ORDER — KETOROLAC TROMETHAMINE 30 MG/ML IJ SOLN
INTRAMUSCULAR | Status: AC
  Filled 2024-02-04: qty 1

## 2024-02-04 MED ORDER — ACETAMINOPHEN 10 MG/ML IV SOLN
INTRAVENOUS | Status: AC
  Filled 2024-02-04: qty 100

## 2024-02-04 MED ORDER — KETOROLAC TROMETHAMINE 30 MG/ML IJ SOLN
30.0000 mg | Freq: Once | INTRAMUSCULAR | Status: AC | PRN
Start: 2024-02-04 — End: 2024-02-04
  Administered 2024-02-04: 30 mg via INTRAVENOUS

## 2024-02-04 MED ORDER — DEXAMETHASONE SODIUM PHOSPHATE 10 MG/ML IJ SOLN
INTRAMUSCULAR | Status: DC | PRN
  Administered 2024-02-04: 10 mg via INTRAVENOUS

## 2024-02-04 MED ORDER — MIDAZOLAM HCL 2 MG/2ML IJ SOLN
INTRAMUSCULAR | Status: DC | PRN
  Administered 2024-02-04: 2 mg via INTRAVENOUS

## 2024-02-04 MED ORDER — CEFAZOLIN SODIUM-DEXTROSE 2-4 GM/100ML-% IV SOLN
2.0000 g | INTRAVENOUS | Status: AC
  Administered 2024-02-04: 2 g via INTRAVENOUS
  Filled 2024-02-04: qty 100

## 2024-02-04 MED ORDER — OXYCODONE HCL 5 MG/5ML PO SOLN: 5.0000 mg | Freq: Once | ORAL | Status: AC | PRN

## 2024-02-04 MED ORDER — FENTANYL CITRATE PF 50 MCG/ML IJ SOSY
25.0000 ug | PREFILLED_SYRINGE | INTRAMUSCULAR | Status: DC | PRN
  Administered 2024-02-04: 50 ug via INTRAVENOUS

## 2024-02-04 MED ORDER — CEPHALEXIN 500 MG PO CAPS: 500.0000 mg | ORAL_CAPSULE | Freq: Once | ORAL | 0 refills | Status: AC

## 2024-02-04 MED ORDER — FENTANYL CITRATE (PF) 100 MCG/2ML IJ SOLN
INTRAMUSCULAR | Status: DC | PRN
  Administered 2024-02-04: 100 ug via INTRAVENOUS

## 2024-02-04 MED ORDER — SODIUM CHLORIDE 0.9 % IR SOLN
Status: DC | PRN
  Administered 2024-02-04: 3000 mL

## 2024-02-04 SURGICAL SUPPLY — 20 items
BAG URO CATCHER STRL LF (MISCELLANEOUS) ×1 IMPLANT
BASKET LASER NITINOL 1.9FR (BASKET) IMPLANT
BASKET ZERO TIP NITINOL 2.4FR (BASKET) IMPLANT
CATH URETL OPEN 5X70 (CATHETERS) ×1 IMPLANT
CLOTH BEACON ORANGE TIMEOUT ST (SAFETY) ×1 IMPLANT
DRSG TEGADERM 2-3/8X2-3/4 SM (GAUZE/BANDAGES/DRESSINGS) IMPLANT
EXTRACTOR STONE NITINOL NGAGE (UROLOGICAL SUPPLIES) IMPLANT
FIBER LASER MOSES 200 DFL (Laser) IMPLANT
GLOVE BIO SURGEON STRL SZ 6.5 (GLOVE) ×1 IMPLANT
GOWN STRL REUS W/ TWL LRG LVL3 (GOWN DISPOSABLE) ×1 IMPLANT
GUIDEWIRE STR DUAL SENSOR (WIRE) ×1 IMPLANT
KIT TURNOVER KIT A (KITS) ×1 IMPLANT
MANIFOLD NEPTUNE II (INSTRUMENTS) ×1 IMPLANT
PACK CYSTO (CUSTOM PROCEDURE TRAY) ×1 IMPLANT
SHEATH NAVIGATOR HD 11/13X28 (SHEATH) IMPLANT
SHEATH NAVIGATOR HD 11/13X36 (SHEATH) IMPLANT
STENT URET 6FRX26 CONTOUR (STENTS) IMPLANT
TRACTIP FLEXIVA PULS ID 200XHI (Laser) IMPLANT
TUBING CONNECTING 10 (TUBING) ×1 IMPLANT
TUBING UROLOGY SET (TUBING) ×1 IMPLANT

## 2024-02-04 NOTE — Anesthesia Procedure Notes (Signed)
 Procedure Name: LMA Insertion Date/Time: 02/04/2024 12:15 PM  Performed by: Cindie Charleen PARAS, CRNAPre-anesthesia Checklist: Patient identified, Emergency Drugs available, Suction available, Patient being monitored and Timeout performed Patient Re-evaluated:Patient Re-evaluated prior to induction Oxygen Delivery Method: Circle system utilized Preoxygenation: Pre-oxygenation with 100% oxygen Induction Type: IV induction Ventilation: Mask ventilation without difficulty LMA: LMA inserted LMA Size: 4.0 Number of attempts: 1 Placement Confirmation: positive ETCO2 and breath sounds checked- equal and bilateral Tube secured with: Tape

## 2024-02-04 NOTE — Interval H&P Note (Signed)
 History and Physical Interval Note: She is here for staged ureteroscopy to remove residual stone burden.  02/04/2024 10:26 AM  Monica Wolfe  has presented today for surgery, with the diagnosis of LEFT RENAL CALCULUS.  The various methods of treatment have been discussed with the patient and family. After consideration of risks, benefits and other options for treatment, the patient has consented to  Procedure(s) with comments: CYSTOSCOPY/URETEROSCOPY/HOLMIUM LASER/STENT PLACEMENT (Left) - CYSTOSCOPY/LEFT URETEROSCOPY/HOLMIUM LASER/STENT PLACEMENT/RETROGRADE PYELOGRAM as a surgical intervention.  The patient's history has been reviewed, patient examined, no change in status, stable for surgery.  I have reviewed the patient's chart and labs.  Questions were answered to the patient's satisfaction.     Eppie Barhorst D Trevione Wert

## 2024-02-04 NOTE — Discharge Instructions (Addendum)
 DISCHARGE INSTRUCTIONS FOR KIDNEY STONE/URETERAL STENT   MEDICATIONS:  1. Resume all your other meds from home  2. AZO over the counter can help with the burning/stinging when you urinate. 3. Hydrocodone -acetaminophen  is for moderate/severe pain 4. Take cephalexin  1 hour prior to stent removal   ACTIVITY:  1. No strenuous activity x 1week  2. No driving while on narcotic pain medications  3. Drink plenty of water  4. Continue to walk at home - you can still get blood clots when you are at home, so keep active, but don't over do it.  5. May return to work/school tomorrow or when you feel ready   BATHING:  1. You can shower and we recommend daily showers  2. You have a string coming from your urethra: The stent string is attached to your ureteral stent. Do not pull on this.   SIGNS/SYMPTOMS TO CALL:  Please call us  if you have a fever greater than 101.5, uncontrolled nausea/vomiting, uncontrolled pain, dizziness, unable to urinate, bloody urine, chest pain, shortness of breath, leg swelling, leg pain, redness around wound, drainage from wound, or any other concerns or questions.   You can reach us  at [phone].   FOLLOW-UP:  1. You have a string attached to your stent.   You can remove it on Tuesday, September 2nd or wait until your appointment on Friday, September 5th and we'll do it in the office.  To remove at home, pull the string in your vagina and remove until the entire blue stent is out.

## 2024-02-04 NOTE — Op Note (Signed)
 Preoperative diagnosis: left renal calculus  Postoperative diagnosis: left renal calculus  Procedure:  Cystoscopy left ureteroscopy, laser lithotripsy, basket stone extraction left 69F x 26 ureteral stent exchange with tether  Surgeon: Valli Shank, MD  Anesthesia: General  Complications: None  Intraoperative findings:  Normal urethra Bilateral orthotropic ureteral orifices Bladder mucosa normal without masses   EBL: Minimal  Specimens: left renal calculus  Disposition of specimens: [facility] for stone analysis  Indication: Monica Wolfe is a 38 y.o.   patient with a 16 mm left ureteral stone and associated left symptoms here for second stage ureteroscopy and removal of residual stone burden. After reviewing the management options for treatment, the patient elected to proceed with the above surgical procedure(s). We have discussed the potential benefits and risks of the procedure, side effects of the proposed treatment, the likelihood of the patient achieving the goals of the procedure, and any potential problems that might occur during the procedure or recuperation. Informed consent has been obtained.   Description of procedure:  The patient was taken to the operating room and general anesthesia was induced.  The patient was placed in the dorsal lithotomy position, prepped and draped in the usual sterile fashion, and preoperative antibiotics were administered. A preoperative time-out was performed.   Cystourethroscopy was performed.  The patient's urethra was examined and was normal. The bladder was then systematically examined in its entirety. There was no evidence for any bladder tumors, stones, or other mucosal pathology.    Attention then turned to the left ureteral orifice and graspers were used to bring the existing ureteral stent to the urethral meatus.  A 0.38 sensor guidewire was then advanced through the ureteral stent and up the left  ureter into the renal pelvis under fluoroscopic guidance.  The stent was discarded.  A second sensor wire was placed alongside the for sensor wire with fluoroscopic guidance.  1 wire was secured as a safety wire.  The ureteral access sheath was then placed over the second wire and advanced the proximal ureter with fluoroscopic guidance.  The inner sheath and wire were removed.  Flexible ureteroscopy took place.  There was several previously seen fragmented calculi.  The larger stones were again fragmented with a 200 microns holmium laser fiber.  Basket and then took place with the escape basket until all the fragments were deemed to be less than 2 mm in size.  The ureteroscope was then removed and using with the access sheath taking care to examine the ureter on the way out.  There is no remaining stone fragments to the ureter or trauma noted to the ureter.   The wire was then backloaded through the cystoscope and a ureteral stent was advance over the wire using Seldinger technique.  The stent was positioned appropriately under fluoroscopic and cystoscopic guidance.  The wire was then removed with an adequate stent curl noted in the renal pelvis as well as in the bladder.  The bladder was then emptied and the procedure ended.  The patient appeared to tolerate the procedure well and without complications.  The patient was able to be awakened and transferred to the recovery unit in satisfactory condition.   Disposition: The tether of the stent was left on and tucked inside the patient's vagina.  Instructions for removing the stent have been provided to the patient.

## 2024-02-04 NOTE — Transfer of Care (Signed)
 Immediate Anesthesia Transfer of Care Note  Patient: Monica Wolfe  Procedure(s) Performed: CYSTOSCOPY/URETEROSCOPY/HOLMIUM LASER/STENT PLACEMENT (Left)  Patient Location: PACU  Anesthesia Type:General  Level of Consciousness: sedated, patient cooperative, and responds to stimulation  Airway & Oxygen Therapy: Patient Spontanous Breathing and Patient connected to face mask oxygen  Post-op Assessment: Report given to RN and Post -op Vital signs reviewed and stable  Post vital signs: Reviewed and stable  Last Vitals:  Vitals Value Taken Time  BP 133/89 02/04/24 13:21  Temp    Pulse 84 02/04/24 13:23  Resp 11 02/04/24 13:23  SpO2 100 % 02/04/24 13:23  Vitals shown include unfiled device data.  Last Pain:  Vitals:   02/04/24 1015  PainSc: 7          Complications: No notable events documented.

## 2024-02-04 NOTE — Anesthesia Preprocedure Evaluation (Addendum)
 Anesthesia Evaluation  Patient identified by MRN, date of birth, ID band Patient awake    Reviewed: Allergy & Precautions, NPO status , Patient's Chart, lab work & pertinent test results  Airway Mallampati: II  TM Distance: >3 FB Neck ROM: Full    Dental no notable dental hx.    Pulmonary neg pulmonary ROS   Pulmonary exam normal        Cardiovascular hypertension, Pt. on medications Normal cardiovascular exam     Neuro/Psych negative neurological ROS  negative psych ROS   GI/Hepatic negative GI ROS, Neg liver ROS,,,  Endo/Other  diabetes    Renal/GU Renal disease     Musculoskeletal negative musculoskeletal ROS (+)    Abdominal  (+) + obese  Peds  Hematology negative hematology ROS (+)   Anesthesia Other Findings LEFT RENAL CALCULUS  Reproductive/Obstetrics Hcg negative                              Anesthesia Physical Anesthesia Plan  ASA: 2  Anesthesia Plan: General   Post-op Pain Management:    Induction: Intravenous  PONV Risk Score and Plan: 3 and Ondansetron , Dexamethasone , Midazolam  and Treatment may vary due to age or medical condition  Airway Management Planned: LMA  Additional Equipment:   Intra-op Plan:   Post-operative Plan: Extubation in OR  Informed Consent: I have reviewed the patients History and Physical, chart, labs and discussed the procedure including the risks, benefits and alternatives for the proposed anesthesia with the patient or authorized representative who has indicated his/her understanding and acceptance.     Dental advisory given and Interpreter used for interview  Plan Discussed with: CRNA  Anesthesia Plan Comments:          Anesthesia Quick Evaluation

## 2024-02-05 ENCOUNTER — Encounter (HOSPITAL_COMMUNITY): Payer: Self-pay | Admitting: Urology

## 2024-02-05 NOTE — Anesthesia Postprocedure Evaluation (Signed)
 Anesthesia Post Note  Patient: Monica Wolfe  Procedure(s) Performed: CYSTOSCOPY/URETEROSCOPY/HOLMIUM LASER/STENT PLACEMENT (Left)     Patient location during evaluation: PACU Anesthesia Type: General Level of consciousness: awake Pain management: pain level controlled Vital Signs Assessment: post-procedure vital signs reviewed and stable Respiratory status: spontaneous breathing, nonlabored ventilation and respiratory function stable Cardiovascular status: blood pressure returned to baseline and stable Postop Assessment: no apparent nausea or vomiting Anesthetic complications: no   No notable events documented.  Last Vitals:  Vitals:   02/04/24 1500 02/04/24 1515  BP: (!) 155/98 (!) 155/98  Pulse: 72 72  Resp: 12 12  Temp: 36.6 C 36.6 C  SpO2: 94% 95%    Last Pain:  Vitals:   02/04/24 1515  PainSc: 0-No pain                 Kimi Bordeau P Lujean Ebright

## 2024-04-18 ENCOUNTER — Ambulatory Visit: Payer: Self-pay | Admitting: Internal Medicine

## 2024-07-02 DIAGNOSIS — I10 Essential (primary) hypertension: Secondary | ICD-10-CM

## 2024-07-05 NOTE — Telephone Encounter (Signed)
 Patient to be seen by primary care provider, may prescribe PRN.  Camie Rote, MSN, CNM, RNC-OB Certified Nurse Midwife, [facility] 07/05/2024 10:06 AM

## 2024-07-15 ENCOUNTER — Encounter (HOSPITAL_COMMUNITY): Payer: Self-pay

## 2024-07-15 ENCOUNTER — Inpatient Hospital Stay (HOSPITAL_COMMUNITY): Admission: RE | Admit: 2024-07-15 | Discharge: 2024-07-15 | Payer: Self-pay

## 2024-07-15 VITALS — BP 170/101 | HR 83 | Temp 98.8°F | Resp 16

## 2024-07-15 DIAGNOSIS — I10 Essential (primary) hypertension: Secondary | ICD-10-CM

## 2024-07-15 DIAGNOSIS — Z760 Encounter for issue of repeat prescription: Secondary | ICD-10-CM

## 2024-07-15 MED ORDER — NIFEDIPINE ER OSMOTIC RELEASE 60 MG PO TB24: 60.0000 mg | ORAL_TABLET | Freq: Every day | ORAL | 1 refills | Status: AC

## 2024-07-15 NOTE — ED Provider Notes (Signed)
 " [facility]  Note:  This document was prepared using Dragon voice recognition software and may include unintentional dictation errors.  MRN: [id number] DOB: 12-29-1985  Subjective:   Monica Wolfe is a 39 y.o. female presenting for medication refill for nifedipine  for blood pressure control.  Patient reports that she has a new primary care provider appointment coming up in March but ran out of her blood pressure medication 3 days ago.  Patient reports that she is having increased blood pressure readings but denies any headache, blurred vision, weakness, disorientation, dizziness, shortness of breath, chest pain.  Patient denies any other medical concerns at this time.  Patient was informed previously that she had prediabetes patient never been prescribed any medication.  Patient was asking if she needed to be prescribed medication for diabetes control.  Discussed with patient that primary care provider would do appropriate testing and manage appropriately during visit in March  Current Medications[1]   Allergies[2]  Past Medical History:  Diagnosis Date   Bilateral knee pain 07/19/2015   Breast lesion 05/10/2019   Seen at breast center 04/2019     IMPRESSION:  1. No evidence of malignancy in the 3 o'clock position of the left  breast. The focal area of palpable soft tissue thickening  corresponds to normal fibroglandular tissue at ultrasound.  2. 3.2 x 2.4 x 0.5 cm left axillary dermal fluid collection  compatible with a sebaceous cyst (epidermal inclusion cyst) that has  eroded through skin.     RECOMMENDA   Chronic hypertension 05/10/2019   COVID-19 06/18/2019   +COVID-19 06/17/19   Gestational diabetes 2024   H/O postpartum hemorrhage, currently pregnant 05/10/2019   With 1st delivery  Had to get blood transfusion  Reports she had a seizure due to blood loss  Denies being diagnosed with pre-eclampsia/eclampsia   History of kidney stones    Kidney stone  10/12/2013   Language barrier 05/10/2019   Spanish translator     Pre-diabetes    Preterm delivery    Preterm labor in third trimester 09/19/2019   Preterm premature rupture of membranes (PPROM) with onset of labor after 24 hours of rupture in third trimester, antepartum 09/07/2019   Recurrent UTI 10/12/2013     Past Surgical History:  Procedure Laterality Date   CYSTOSCOPY/URETEROSCOPY/HOLMIUM LASER/STENT PLACEMENT Left 01/21/2024   Procedure: CYSTOSCOPY/URETEROSCOPY/HOLMIUM LASER/STENT PLACEMENT;  Surgeon: Elisabeth Valli BIRCH, MD;  Location: [facility];  Service: Urology;  Laterality: Left;  CYSTOSCOPY/LEFT URETEROSCOPY/HOLMIUM LASER/STENT PLACEMENT/RETROGRADE PYELOGRAM   CYSTOSCOPY/URETEROSCOPY/HOLMIUM LASER/STENT PLACEMENT Left 02/04/2024   Procedure: CYSTOSCOPY/URETEROSCOPY/HOLMIUM LASER/STENT PLACEMENT;  Surgeon: Elisabeth Valli BIRCH, MD;  Location: [facility];  Service: Urology;  Laterality: Left;  CYSTOSCOPY/LEFT URETEROSCOPY/HOLMIUM LASER/STENT PLACEMENT/RETROGRADE PYELOGRAM   LITHOTRIPSY  10/17/2013    Family History  Problem Relation Age of Onset   Hypertension Mother    CAD Father    Hypertension Maternal Grandmother    Diabetes Maternal Grandmother    Asthma Neg Hx    Cancer Neg Hx    Heart disease Neg Hx     Social History[3]  ROS Refer to HPI for ROS details.  Objective:    Vitals: BP (!) 170/101 (BP Location: Left Arm)   Pulse 83   Temp 98.8 F (37.1 C) (Oral)   Resp 16   LMP 07/11/2024 (Exact Date)   SpO2 98%   Breastfeeding No   Physical Exam Vitals and nursing note reviewed.  Constitutional:      General: She is not in acute distress.  Appearance: Normal appearance. She is well-developed. She is not ill-appearing or toxic-appearing.  HENT:     Head: Normocephalic and atraumatic.     Mouth/Throat:     Mouth: Mucous membranes are moist.  Cardiovascular:     Rate and Rhythm: Normal rate.  Pulmonary:     Effort: Pulmonary effort is normal. No respiratory  distress.     Breath sounds: No stridor. No wheezing.  Skin:    General: Skin is warm and dry.  Neurological:     General: No focal deficit present.     Mental Status: She is alert and oriented to person, place, and time.  Psychiatric:        Mood and Affect: Mood normal.        Behavior: Behavior normal.     Procedures  No results found for this or any previous visit (from the past 24 hours).  Assessment and Plan :     Discharge Instructions       1. Medication refill (Primary) - NIFEdipine  (PROCARDIA  XL) 60 MG 24 hr tablet; Take 1 tablet (60 mg total) by mouth daily.  Dispense: 30 tablet; Refill: 1 - Follow-up with primary care provider as scheduled in March for ongoing management of chronic hypertension  -Continue to monitor symptoms for any change in severity if there is any escalation of current symptoms or development of new symptoms follow-up in ER for further evaluation and management.      Monica Wolfe    [1] No current facility-administered medications for this encounter.  Current Outpatient Medications:    HYDROcodone -acetaminophen  (NORCO/VICODIN) 5-325 MG tablet, Take 1 tablet by mouth 2 (two) times daily as needed (pain.). (Patient not taking: Reported on 02/02/2024), Disp: , Rfl:    HYDROcodone -acetaminophen  (NORCO/VICODIN) 5-325 MG tablet, Take 1 tablet by mouth every 6 (six) hours as needed for moderate pain (pain score 4-6)., Disp: 10 tablet, Rfl: 0   NIFEdipine  (PROCARDIA  XL) 60 MG 24 hr tablet, Take 1 tablet (60 mg total) by mouth daily., Disp: 30 tablet, Rfl: 1   tamsulosin  (FLOMAX ) 0.4 MG CAPS capsule, Take 1 capsule (0.4 mg total) by mouth at bedtime. (Patient not taking: Reported on 02/02/2024), Disp: 30 capsule, Rfl: 0   traMADol  (ULTRAM ) 50 MG tablet, Take 1 tablet (50 mg total) by mouth every 6 (six) hours as needed. (Patient not taking: Reported on 02/02/2024), Disp: 20 tablet, Rfl: 0 [2] No Known Allergies [3]  Social History Tobacco Use    Smoking status: Never    Passive exposure: Never   Smokeless tobacco: Never  Vaping Use   Vaping status: Never Used  Substance Use Topics   Alcohol use: No   Drug use: No     Aurea Goodell B, NP 07/15/24 [id number]  "

## 2024-07-15 NOTE — Discharge Instructions (Addendum)
" °  1. Medication refill (Primary) - NIFEdipine  (PROCARDIA  XL) 60 MG 24 hr tablet; Take 1 tablet (60 mg total) by mouth daily.  Dispense: 30 tablet; Refill: 1 - Follow-up with primary care provider as scheduled in March for ongoing management of chronic hypertension  -Continue to monitor symptoms for any change in severity if there is any escalation of current symptoms or development of new symptoms follow-up in ER for further evaluation and management. "

## 2024-07-15 NOTE — ED Triage Notes (Signed)
 Pt states her pcp shut his office and she needs a refill of her BP meds she has been out X 3 days. She has a new pcp appt but its not until 08/2024  She was told she has pre diabetes and wants to know if she needs meds for this.

## 2024-08-11 ENCOUNTER — Ambulatory Visit: Payer: Self-pay | Admitting: Internal Medicine
